# Patient Record
Sex: Male | Born: 1955 | Race: White | Hispanic: No | Smoking: Current every day smoker
Health system: Southern US, Community
[De-identification: ages and names within clinical notes are randomized; demographics above are authoritative.]

## PROBLEM LIST (undated history)

## (undated) DIAGNOSIS — C801 Malignant (primary) neoplasm, unspecified: Secondary | ICD-10-CM

## (undated) DIAGNOSIS — R918 Other nonspecific abnormal finding of lung field: Secondary | ICD-10-CM

## (undated) DIAGNOSIS — J449 Chronic obstructive pulmonary disease, unspecified: Secondary | ICD-10-CM

## (undated) DIAGNOSIS — I10 Essential (primary) hypertension: Secondary | ICD-10-CM

## (undated) DIAGNOSIS — E785 Hyperlipidemia, unspecified: Secondary | ICD-10-CM

## (undated) DIAGNOSIS — E279 Disorder of adrenal gland, unspecified: Secondary | ICD-10-CM

## (undated) HISTORY — PX: LUNG BIOPSY: SHX232

## (undated) HISTORY — DX: Chronic obstructive pulmonary disease, unspecified: J44.9

---

## 2004-06-01 ENCOUNTER — Emergency Department (HOSPITAL_COMMUNITY): Admission: EM | Admit: 2004-06-01 | Discharge: 2004-06-01 | Payer: Self-pay | Admitting: Emergency Medicine

## 2014-02-27 DIAGNOSIS — R079 Chest pain, unspecified: Secondary | ICD-10-CM | POA: Insufficient documentation

## 2014-02-27 DIAGNOSIS — R42 Dizziness and giddiness: Secondary | ICD-10-CM | POA: Insufficient documentation

## 2014-02-27 DIAGNOSIS — F419 Anxiety disorder, unspecified: Secondary | ICD-10-CM | POA: Insufficient documentation

## 2014-02-27 DIAGNOSIS — E875 Hyperkalemia: Secondary | ICD-10-CM | POA: Insufficient documentation

## 2016-09-17 ENCOUNTER — Emergency Department: Payer: Self-pay

## 2016-09-17 ENCOUNTER — Emergency Department
Admission: EM | Admit: 2016-09-17 | Discharge: 2016-09-18 | Disposition: A | Discharge status: Home / Self Care | Payer: Self-pay | Attending: Emergency Medicine | Admitting: Emergency Medicine

## 2016-09-17 DIAGNOSIS — I10 Essential (primary) hypertension: Secondary | ICD-10-CM | POA: Insufficient documentation

## 2016-09-17 DIAGNOSIS — F17200 Nicotine dependence, unspecified, uncomplicated: Secondary | ICD-10-CM | POA: Insufficient documentation

## 2016-09-17 DIAGNOSIS — R69 Illness, unspecified: Secondary | ICD-10-CM

## 2016-09-17 DIAGNOSIS — J111 Influenza due to unidentified influenza virus with other respiratory manifestations: Secondary | ICD-10-CM | POA: Insufficient documentation

## 2016-09-17 DIAGNOSIS — J439 Emphysema, unspecified: Secondary | ICD-10-CM | POA: Insufficient documentation

## 2016-09-17 LAB — CBC
HEMATOCRIT: 37.8 % — AB (ref 40.0–52.0)
Hemoglobin: 13.2 g/dL (ref 13.0–18.0)
MCH: 32.5 pg (ref 26.0–34.0)
MCHC: 34.9 g/dL (ref 32.0–36.0)
MCV: 93.1 fL (ref 80.0–100.0)
Platelets: 325 10*3/uL (ref 150–440)
RBC: 4.06 MIL/uL — AB (ref 4.40–5.90)
RDW: 12.5 % (ref 11.5–14.5)
WBC: 14.1 10*3/uL — ABNORMAL HIGH (ref 3.8–10.6)

## 2016-09-17 LAB — BASIC METABOLIC PANEL
Anion gap: 12 (ref 5–15)
BUN: 16 mg/dL (ref 6–20)
CHLORIDE: 96 mmol/L — AB (ref 101–111)
CO2: 23 mmol/L (ref 22–32)
Calcium: 9.1 mg/dL (ref 8.9–10.3)
Creatinine, Ser: 0.86 mg/dL (ref 0.61–1.24)
GFR calc non Af Amer: >60 mL/min (ref >60)
Glucose, Bld: 109 mg/dL — ABNORMAL HIGH (ref 65–99)
POTASSIUM: 3.8 mmol/L (ref 3.5–5.1)
SODIUM: 131 mmol/L — AB (ref 135–145)

## 2016-09-17 LAB — TROPONIN I: Troponin I: <0.03 ng/mL (ref <0.03)

## 2016-09-17 MED ORDER — AMLODIPINE BESYLATE 5 MG PO TABS
5.0000 mg | ORAL_TABLET | Freq: Once | ORAL | Status: AC
  Administered 2016-09-18: 5 mg via ORAL
  Filled 2016-09-17: qty 1

## 2016-09-17 MED ORDER — DEXAMETHASONE SODIUM PHOSPHATE 4 MG/ML IJ SOLN
10.0000 mg | Freq: Once | INTRAMUSCULAR | Status: AC
  Administered 2016-09-18: 10 mg via INTRAVENOUS
  Filled 2016-09-17: qty 3

## 2016-09-17 NOTE — ED Triage Notes (Signed)
Pt from home via EMS, reports "illness" for 4 days, reports diarrhea, chest pain, cough, dizziness and generalized sickness

## 2016-09-18 MED ORDER — NAPROXEN 500 MG PO TABS: 500.0000 mg | ORAL_TABLET | Freq: Two times a day (BID) | ORAL | 0 refills | Status: DC

## 2016-09-18 MED ORDER — AMLODIPINE BESYLATE 5 MG PO TABS: 5.0000 mg | ORAL_TABLET | Freq: Every day | ORAL | 1 refills | Status: DC

## 2016-09-18 MED ORDER — AZITHROMYCIN 250 MG PO TABS
ORAL_TABLET | ORAL | 0 refills | Status: DC
Start: 2016-09-18 — End: 2020-05-16

## 2016-09-18 NOTE — ED Provider Notes (Signed)
[facility] Emergency Department Provider Note  ____________________________________________  Time seen: Approximately 12:12 AM  I have reviewed the triage vital signs and the nursing notes.   HISTORY  Chief Complaint Diarrhea and Influenza    HPI Douglas [name] is a 60 y.o. male complains of feeling ill with generalized weakness and malaise bodyaches diarrhea nonproductive cough and dizziness for the past 4 days. Today he went to [employer], but some NyQuil, sat in the parking lot drinking it, went home and took a nap, and then woke up feeling somewhat delirious and confused. He never taken NyQuil before. He does admit that he took more than the recommended dose.  No fever or chills.  Does report previous diagnosis of hypertension but has been off of medication for 2 years due to loss of job and insurance     History reviewed. No pertinent past medical history.   There are no active problems to display for this patient.    History reviewed. No pertinent surgical history.   Prior to Admission medications   Medication Sig Start Date End Date Taking? Authorizing Provider  amLODipine (NORVASC) 5 MG tablet Take 1 tablet (5 mg total) by mouth daily. 09/18/16   Carrie Mew, MD  azithromycin (ZITHROMAX Z-PAK) 250 MG tablet Take 2 tablets (500 mg) on  Day 1,  followed by 1 tablet (250 mg) once daily on Days 2 through 5. 09/18/16   Carrie Mew, MD  naproxen (NAPROSYN) 500 MG tablet Take 1 tablet (500 mg total) by mouth 2 (two) times daily with a meal. 09/18/16   Carrie Mew, MD     Allergies Patient has no known allergies.   No family history on file.  Social History Social History  Substance Use Topics  . Smoking status: Current Every Day Smoker  . Smokeless tobacco: Never Used  . Alcohol use Yes    Review of Systems  Constitutional:   No fever Positive chills.  ENT:   Positive rhinorrhea and sore throat. Cardiovascular:    No chest pain. Respiratory:   No dyspnea. Positive nonproductive cough. Gastrointestinal:   Negative for abdominal pain, vomiting and diarrhea.  Genitourinary:   Negative for dysuria or difficulty urinating. Musculoskeletal:   Positive myalgias Neurological:   Negative for headaches 10-point ROS otherwise negative.  ____________________________________________   PHYSICAL EXAM:  VITAL SIGNS: ED Triage Vitals  Enc Vitals Group     BP 09/17/16 2235 (!) 186/100     Pulse Rate 09/17/16 2237 68     Resp 09/18/16 0005 20     Temp 09/17/16 2236 98.3 F (36.8 C)     Temp src --      SpO2 09/17/16 2237 93 %     Weight 09/17/16 2236 150 lb (68 kg)     Height 09/17/16 2236 '5\' 5"'$  (1.651 m)     Head Circumference --      Peak Flow --      Pain Score 09/17/16 2236 10     Pain Loc --      Pain Edu? --      Excl. in [place]? --     Vital signs reviewed, nursing assessments reviewed.   Constitutional:   Alert and oriented. Well appearing and in no distress. Eyes:   No scleral icterus. No conjunctival pallor. PERRL. EOMI.  No nystagmus. ENT   Head:   Normocephalic and atraumatic.   Nose:   No congestion/rhinnorhea. No septal hematoma   Mouth/Throat:   MMM, Mild  pharyngeal erythema. No peritonsillar mass.    Neck:   No stridor. No SubQ emphysema. No meningismus. Hematological/Lymphatic/Immunilogical:   No cervical lymphadenopathy. Cardiovascular:   RRR. Symmetric bilateral radial and DP pulses.  No murmurs.  Respiratory:   Normal respiratory effort without tachypnea nor retractions. Breath sounds are clear and equal bilaterally. No wheezes/rales/rhonchi. Bronchospastic cough occasionally Gastrointestinal:   Soft and nontender. Non distended. There is no CVA tenderness.  No rebound, rigidity, or guarding. Genitourinary:   deferred Musculoskeletal:   Nontender with normal range of motion in all extremities. No joint effusions.  No lower extremity tenderness.  No edema. Neurologic:    Normal speech and language.  CN 2-10 normal. Motor grossly intact. No gross focal neurologic deficits are appreciated.  Skin:    Skin is warm, dry and intact. No rash noted.  No petechiae, purpura, or bullae.  ____________________________________________    LABS (pertinent positives/negatives) (all labs ordered are listed, but only abnormal results are displayed) Labs Reviewed  BASIC METABOLIC PANEL - Abnormal; Notable for the following:       Result Value   Sodium 131 (*)    Chloride 96 (*)    Glucose, Bld 109 (*)    All other components within normal limits  CBC - Abnormal; Notable for the following:    WBC 14.1 (*)    RBC 4.06 (*)    HCT 37.8 (*)    All other components within normal limits  TROPONIN I   ____________________________________________   EKG    ____________________________________________    RADIOLOGY  Chest x-ray unremarkable. Evidence of emphysema  ____________________________________________   PROCEDURES Procedures  ____________________________________________   INITIAL IMPRESSION / ASSESSMENT AND PLAN / ED COURSE  Pertinent labs & imaging results that were available during my care of the patient were reviewed by me and considered in my medical decision making (see chart for details).  Patient well appearing no acute distress. Presents with influenza-like illness, URI. Almost certainly viral. Considering the patient's symptoms, medical history, and physical examination today, I have low suspicion for ACS, PE, TAD, pneumothorax, carditis, mediastinitis, pneumonia, CHF, or sepsis.  Decadron given in ED. Continue NSAIDs, azithromycin due to underlying emphysema. Follow-up with primary care. We'll start patient on Norvasc for his hypertension to bring that under control. This seems like a good choice of medication because it doesn't have as much side effects regarding electrolytes and kidney function and require close monitoring. I did strongly  encourage the patient to establish care with primary care.     Clinical Course    ____________________________________________   FINAL CLINICAL IMPRESSION(S) / ED DIAGNOSES  Final diagnoses:  Influenza-like illness  Essential hypertension  Pulmonary emphysema, unspecified emphysema type ([place])       Portions of this note were generated with dragon dictation software. Dictation errors may occur despite best attempts at proofreading.    Carrie Mew, MD 09/18/16 [phone]

## 2017-01-05 ENCOUNTER — Emergency Department: Payer: Self-pay

## 2017-01-05 ENCOUNTER — Encounter: Payer: Self-pay | Admitting: Emergency Medicine

## 2017-01-05 ENCOUNTER — Emergency Department
Admission: EM | Admit: 2017-01-05 | Discharge: 2017-01-05 | Disposition: A | Discharge status: Home / Self Care | Payer: Self-pay | Attending: Emergency Medicine | Admitting: Emergency Medicine

## 2017-01-05 DIAGNOSIS — Z79899 Other long term (current) drug therapy: Secondary | ICD-10-CM | POA: Insufficient documentation

## 2017-01-05 DIAGNOSIS — F17200 Nicotine dependence, unspecified, uncomplicated: Secondary | ICD-10-CM | POA: Insufficient documentation

## 2017-01-05 DIAGNOSIS — I10 Essential (primary) hypertension: Secondary | ICD-10-CM | POA: Insufficient documentation

## 2017-01-05 DIAGNOSIS — J101 Influenza due to other identified influenza virus with other respiratory manifestations: Secondary | ICD-10-CM | POA: Insufficient documentation

## 2017-01-05 HISTORY — DX: Essential (primary) hypertension: I10

## 2017-01-05 LAB — CBC
HEMATOCRIT: 48.9 % (ref 40.0–52.0)
HEMOGLOBIN: 16.8 g/dL (ref 13.0–18.0)
MCH: 32.3 pg (ref 26.0–34.0)
MCHC: 34.4 g/dL (ref 32.0–36.0)
MCV: 93.7 fL (ref 80.0–100.0)
Platelets: 210 10*3/uL (ref 150–440)
RBC: 5.22 MIL/uL (ref 4.40–5.90)
RDW: 13.4 % (ref 11.5–14.5)
WBC: 10.3 10*3/uL (ref 3.8–10.6)

## 2017-01-05 LAB — BASIC METABOLIC PANEL
ANION GAP: 10 (ref 5–15)
BUN: 10 mg/dL (ref 6–20)
CHLORIDE: 96 mmol/L — AB (ref 101–111)
CO2: 26 mmol/L (ref 22–32)
Calcium: 8.9 mg/dL (ref 8.9–10.3)
Creatinine, Ser: 0.88 mg/dL (ref 0.61–1.24)
GFR calc Af Amer: >60 mL/min (ref >60)
GLUCOSE: 114 mg/dL — AB (ref 65–99)
POTASSIUM: 4.1 mmol/L (ref 3.5–5.1)
Sodium: 132 mmol/L — ABNORMAL LOW (ref 135–145)

## 2017-01-05 LAB — TROPONIN I: Troponin I: <0.03 ng/mL (ref <0.03)

## 2017-01-05 LAB — INFLUENZA PANEL BY PCR (TYPE A & B)
INFLAPCR: NEGATIVE
INFLBPCR: POSITIVE — AB

## 2017-01-05 MED ORDER — IPRATROPIUM-ALBUTEROL 0.5-2.5 (3) MG/3ML IN SOLN
3.0000 mL | Freq: Once | RESPIRATORY_TRACT | Status: AC
  Administered 2017-01-05: 3 mL via RESPIRATORY_TRACT
  Filled 2017-01-05: qty 3

## 2017-01-05 MED ORDER — KETOROLAC TROMETHAMINE 30 MG/ML IJ SOLN
15.0000 mg | Freq: Once | INTRAMUSCULAR | Status: AC
  Administered 2017-01-05: 15 mg via INTRAVENOUS
  Filled 2017-01-05: qty 1

## 2017-01-05 MED ORDER — ALBUTEROL SULFATE HFA 108 (90 BASE) MCG/ACT IN AERS: 2.0000 | INHALATION_SPRAY | Freq: Four times a day (QID) | RESPIRATORY_TRACT | 2 refills | Status: DC | PRN

## 2017-01-05 MED ORDER — SODIUM CHLORIDE 0.9 % IV BOLUS (SEPSIS)
1000.0000 mL | Freq: Once | INTRAVENOUS | Status: AC
  Administered 2017-01-05: 1000 mL via INTRAVENOUS

## 2017-01-05 MED ORDER — NICOTINE 14 MG/24HR TD PT24: 14.0000 mg | MEDICATED_PATCH | TRANSDERMAL | 0 refills | Status: AC

## 2017-01-05 MED ORDER — ONDANSETRON HCL 4 MG/2ML IJ SOLN
4.0000 mg | Freq: Once | INTRAMUSCULAR | Status: AC
  Administered 2017-01-05: 4 mg via INTRAVENOUS
  Filled 2017-01-05: qty 2

## 2017-01-05 NOTE — ED Provider Notes (Signed)
[facility] Emergency Department Provider Note  ____________________________________________  Time seen: Approximately 2:20 PM  I have reviewed the triage vital signs and the nursing notes.   HISTORY  Chief Complaint Chest Pain; Cough; and Weakness   HPI Douglas [name] is a 61 y.o. male history of hypertension who presents for evaluation of flulike symptoms.Patient reports 5 days of cough productive of clear sputum, shortness of breath, generalized malaise and weakness, body aches, fever and chills, and multiple episodes of watery diarrhea. Has had nausea but no vomiting. Patient's nephew who lives with him has been diagnosed with the flu recently. Patient took over-the-counter medications for his cough and also took 2 pills of Tamiflu from his nephew. Patient complaining of sharp chest/abdomen/back pain when he coughs that resolves at rest. He is a smoker however has not been smoking since his gotten sick 5 days ago.  Past Medical History:  Diagnosis Date  . Hypertension     There are no active problems to display for this patient.   History reviewed. No pertinent surgical history.  Prior to Admission medications   Medication Sig Start Date End Date Taking? Authorizing Provider  amLODipine (NORVASC) 5 MG tablet Take 1 tablet (5 mg total) by mouth daily. 09/18/16  Yes Carrie Mew, MD  albuterol (PROVENTIL HFA;VENTOLIN HFA) 108 (90 Base) MCG/ACT inhaler Inhale 2 puffs into the lungs every 6 (six) hours as needed for wheezing or shortness of breath. 01/05/17   Rudene Re, MD  azithromycin (ZITHROMAX Z-PAK) 250 MG tablet Take 2 tablets (500 mg) on  Day 1,  followed by 1 tablet (250 mg) once daily on Days 2 through 5. Patient not taking: Reported on 01/05/2017 09/18/16   Carrie Mew, MD  naproxen (NAPROSYN) 500 MG tablet Take 1 tablet (500 mg total) by mouth 2 (two) times daily with a meal. Patient not taking: Reported on 01/05/2017 09/18/16    Carrie Mew, MD  nicotine (NICODERM CQ - DOSED IN MG/24 HOURS) 14 mg/24hr patch Place 1 patch (14 mg total) onto the skin daily. 01/05/17 01/05/18  Rudene Re, MD    Allergies Patient has no known allergies.  No family history on file.  Social History Social History  Substance Use Topics  . Smoking status: Current Every Day Smoker  . Smokeless tobacco: Never Used     Comment: none in 5 days.  . Alcohol use Yes    Review of Systems  Constitutional: + fever, chills, body aches, generalized weakness Eyes: Negative for visual changes. ENT: Negative for sore throat. Neck: No neck pain  Cardiovascular: + chest pain. Respiratory: + shortness of breath and cough Gastrointestinal: Negative for abdominal pain, vomiting. + nausea and diarrhea. Genitourinary: Negative for dysuria. Musculoskeletal: Negative for back pain. Skin: Negative for rash. Neurological: Negative for headaches, weakness or numbness. Psych: No SI or HI  ____________________________________________   PHYSICAL EXAM:  VITAL SIGNS: ED Triage Vitals  Enc Vitals Group     BP 01/05/17 1126 (!) 137/94     Pulse Rate 01/05/17 1126 80     Resp 01/05/17 1126 18     Temp 01/05/17 1126 98.1 F (36.7 C)     Temp Source 01/05/17 1126 Oral     SpO2 01/05/17 1126 96 %     Weight 01/05/17 1118 145 lb (65.8 kg)     Height 01/05/17 1118 '5\' 5"'$  (1.651 m)     Head Circumference --      Peak Flow --  Pain Score 01/05/17 1118 10     Pain Loc --      Pain Edu? --      Excl. in [place]? --     Constitutional: Alert and oriented. Well appearing, coughing during exam. HEENT:      Head: Normocephalic and atraumatic.         Eyes: Conjunctivae are normal. Sclera is non-icteric. EOMI. PERRL      Mouth/Throat: Mucous membranes are moist.       Neck: Supple with no signs of meningismus. Cardiovascular: Regular rate and rhythm. No murmurs, gallops, or rubs. 2+ symmetrical distal pulses are present in all extremities. No  JVD. ttp over the chest wall diffusely Respiratory: Normal respiratory effort. Lungs are clear to auscultation bilaterally with faint expiratory wheezes.  Gastrointestinal: Soft, non tender, and non distended with positive bowel sounds. No rebound or guarding. Genitourinary: No CVA tenderness. Musculoskeletal: Nontender with normal range of motion in all extremities. No edema, cyanosis, or erythema of extremities. Neurologic: Normal speech and language. Face is symmetric. Moving all extremities. No gross focal neurologic deficits are appreciated. Skin: Skin is warm, dry and intact. No rash noted. Psychiatric: Mood and affect are normal. Speech and behavior are normal.  ____________________________________________   LABS (all labs ordered are listed, but only abnormal results are displayed)  Labs Reviewed  BASIC METABOLIC PANEL - Abnormal; Notable for the following:       Result Value   Sodium 132 (*)    Chloride 96 (*)    Glucose, Bld 114 (*)    All other components within normal limits  INFLUENZA PANEL BY PCR (TYPE A & B) - Abnormal; Notable for the following:    Influenza B By PCR POSITIVE (*)    All other components within normal limits  CBC  TROPONIN I   ____________________________________________  EKG  ED ECG REPORT I, Rudene Re, the attending physician, personally viewed and interpreted this ECG.  Normal sinus rhythm, rate of 85, normal intervals, normal axis, no ST elevations or depressions. Normal EKG  ____________________________________________  RADIOLOGY  CXR: Negative  ____________________________________________   PROCEDURES  Procedure(s) performed: None Procedures Critical Care performed:  None ____________________________________________   INITIAL IMPRESSION / ASSESSMENT AND PLAN / ED COURSE  61 y.o. male history of hypertension who presents for evaluation of flulike symptoms x 5 days. Patient is coughing significantly during my evaluation,  his vital signs are within normal limits, he has faint diffuse expiratory wheezes throughout with good air movement, no crackles. Vital signs are otherwise within normal limits. Chest x-ray with no evidence of pneumonia. Flu swab is pending. We'll give one DuoNeb treatment this patient has a history of smoking and is now wheezing in the setting of a lower respiratory infection. We'll give IV fluids, IV Zofran, and IV Toradol for pleuritic pain in the setting of coughing.  Clinical Course as of Jan 06 1508  Tue Jan 05, 2017  1506 Patient diagnosed with influenza B. Outside the window for Tamiflu. Patient's cough and shortness of breath markedly improved after 1 DuoNeb. Patient fells improved with IVF. No longer wheezing. We'll send patient home on albuterol. I spent 5 minutes discussing smoking cessation with patient. We'll provide him with a prescription for nicotine patches. [facility] Discussed my standard return precautions.  [CV]    Clinical Course User Index [CV] Rudene Re, MD    Pertinent labs & imaging results that were available during my care of  the patient were reviewed by me and considered in my medical decision making (see chart for details).    ____________________________________________   FINAL CLINICAL IMPRESSION(S) / ED DIAGNOSES  Final diagnoses:  Influenza B      NEW MEDICATIONS STARTED DURING THIS VISIT:  New Prescriptions   ALBUTEROL (PROVENTIL HFA;VENTOLIN HFA) 108 (90 BASE) MCG/ACT INHALER    Inhale 2 puffs into the lungs every 6 (six) hours as needed for wheezing or shortness of breath.   NICOTINE (NICODERM CQ - DOSED IN MG/24 HOURS) 14 MG/24HR PATCH    Place 1 patch (14 mg total) onto the skin daily.     Note:  This document was prepared using Dragon voice recognition software and may include unintentional dictation errors.    Rudene Re, MD 01/05/17 [phone]

## 2017-01-05 NOTE — ED Triage Notes (Signed)
Says has been sick for about 5 days with cough.  Fever yesterday.  Has taken many otc and some tamiflu that was someone elses.  Pt with frequen dry cough in triage.  Says pain in chest is releived by laying flat on his chest.

## 2020-05-16 ENCOUNTER — Other Ambulatory Visit: Payer: Self-pay

## 2020-05-16 ENCOUNTER — Encounter: Payer: Self-pay | Admitting: Emergency Medicine

## 2020-05-16 ENCOUNTER — Observation Stay
Admission: EM | Admit: 2020-05-16 | Discharge: 2020-05-17 | Disposition: A | Discharge status: Home / Self Care | Payer: Medicare Other | Attending: Student | Admitting: Student

## 2020-05-16 ENCOUNTER — Emergency Department: Payer: Medicare Other

## 2020-05-16 DIAGNOSIS — J449 Chronic obstructive pulmonary disease, unspecified: Secondary | ICD-10-CM

## 2020-05-16 DIAGNOSIS — K209 Esophagitis, unspecified without bleeding: Principal | ICD-10-CM | POA: Diagnosis present

## 2020-05-16 DIAGNOSIS — E785 Hyperlipidemia, unspecified: Secondary | ICD-10-CM

## 2020-05-16 DIAGNOSIS — F17210 Nicotine dependence, cigarettes, uncomplicated: Secondary | ICD-10-CM | POA: Diagnosis not present

## 2020-05-16 DIAGNOSIS — R918 Other nonspecific abnormal finding of lung field: Secondary | ICD-10-CM | POA: Diagnosis not present

## 2020-05-16 DIAGNOSIS — R1310 Dysphagia, unspecified: Secondary | ICD-10-CM

## 2020-05-16 DIAGNOSIS — I10 Essential (primary) hypertension: Secondary | ICD-10-CM | POA: Diagnosis not present

## 2020-05-16 DIAGNOSIS — Z20822 Contact with and (suspected) exposure to covid-19: Secondary | ICD-10-CM | POA: Diagnosis not present

## 2020-05-16 DIAGNOSIS — J439 Emphysema, unspecified: Secondary | ICD-10-CM

## 2020-05-16 HISTORY — DX: Hyperlipidemia, unspecified: E78.5

## 2020-05-16 LAB — BASIC METABOLIC PANEL
Anion gap: 11 (ref 5–15)
BUN: 9 mg/dL (ref 8–23)
CO2: 28 mmol/L (ref 22–32)
Calcium: 9.8 mg/dL (ref 8.9–10.3)
Chloride: 100 mmol/L (ref 98–111)
Creatinine, Ser: 1.01 mg/dL (ref 0.61–1.24)
GFR calc Af Amer: >60 mL/min (ref >60)
GFR calc non Af Amer: >60 mL/min (ref >60)
Glucose, Bld: 133 mg/dL — ABNORMAL HIGH (ref 70–99)
Potassium: 4 mmol/L (ref 3.5–5.1)
Sodium: 139 mmol/L (ref 135–145)

## 2020-05-16 LAB — HEPATIC FUNCTION PANEL
ALT: 16 U/L (ref 0–44)
AST: 21 U/L (ref 15–41)
Albumin: 3.4 g/dL — ABNORMAL LOW (ref 3.5–5.0)
Alkaline Phosphatase: 103 U/L (ref 38–126)
Bilirubin, Direct: <0.1 mg/dL (ref 0.0–0.2)
Total Bilirubin: 0.6 mg/dL (ref 0.3–1.2)
Total Protein: 7.4 g/dL (ref 6.5–8.1)

## 2020-05-16 LAB — CBC
HCT: 41.7 % (ref 39.0–52.0)
Hemoglobin: 13.1 g/dL (ref 13.0–17.0)
MCH: 30.8 pg (ref 26.0–34.0)
MCHC: 31.4 g/dL (ref 30.0–36.0)
MCV: 98.1 fL (ref 80.0–100.0)
Platelets: 376 10*3/uL (ref 150–400)
RBC: 4.25 MIL/uL (ref 4.22–5.81)
RDW: 13.2 % (ref 11.5–15.5)
WBC: 16.3 10*3/uL — ABNORMAL HIGH (ref 4.0–10.5)
nRBC: 0 % (ref 0.0–0.2)

## 2020-05-16 LAB — TROPONIN I (HIGH SENSITIVITY)
Troponin I (High Sensitivity): 8 ng/L (ref <18)
Troponin I (High Sensitivity): 7 ng/L (ref <18)

## 2020-05-16 LAB — SARS CORONAVIRUS 2 BY RT PCR (HOSPITAL ORDER, PERFORMED IN ~~LOC~~ HOSPITAL LAB): SARS Coronavirus 2: NEGATIVE

## 2020-05-16 LAB — LIPASE, BLOOD: Lipase: 32 U/L (ref 11–51)

## 2020-05-16 LAB — MAGNESIUM: Magnesium: 2 mg/dL (ref 1.7–2.4)

## 2020-05-16 MED ORDER — FLUCONAZOLE 100 MG PO TABS
200.0000 mg | ORAL_TABLET | Freq: Once | ORAL | Status: AC
  Administered 2020-05-16: 200 mg via ORAL
  Filled 2020-05-16: qty 2

## 2020-05-16 MED ORDER — ACETAMINOPHEN 650 MG RE SUPP: 650.0000 mg | Freq: Four times a day (QID) | RECTAL | Status: DC | PRN

## 2020-05-16 MED ORDER — LACTATED RINGERS IV SOLN: INTRAVENOUS | Status: DC

## 2020-05-16 MED ORDER — ONDANSETRON HCL 4 MG/2ML IJ SOLN: 4.0000 mg | Freq: Four times a day (QID) | INTRAMUSCULAR | Status: DC | PRN

## 2020-05-16 MED ORDER — MORPHINE SULFATE (PF) 2 MG/ML IV SOLN
1.0000 mg | INTRAVENOUS | Status: DC | PRN
  Administered 2020-05-17 (×2): 1 mg via INTRAVENOUS
  Filled 2020-05-16 (×2): qty 1

## 2020-05-16 MED ORDER — IPRATROPIUM-ALBUTEROL 0.5-2.5 (3) MG/3ML IN SOLN
3.0000 mL | Freq: Once | RESPIRATORY_TRACT | Status: AC
  Administered 2020-05-16: 3 mL via RESPIRATORY_TRACT
  Filled 2020-05-16: qty 3

## 2020-05-16 MED ORDER — ROSUVASTATIN CALCIUM 10 MG PO TABS
10.0000 mg | ORAL_TABLET | Freq: Every day | ORAL | Status: DC
  Filled 2020-05-16: qty 1

## 2020-05-16 MED ORDER — LABETALOL HCL 5 MG/ML IV SOLN
10.0000 mg | Freq: Four times a day (QID) | INTRAVENOUS | Status: DC | PRN
  Administered 2020-05-17 (×2): 10 mg via INTRAVENOUS
  Filled 2020-05-16 (×2): qty 4

## 2020-05-16 MED ORDER — GABAPENTIN 300 MG PO CAPS
300.0000 mg | ORAL_CAPSULE | Freq: Two times a day (BID) | ORAL | Status: DC
  Administered 2020-05-17: 300 mg via ORAL
  Filled 2020-05-16: qty 1

## 2020-05-16 MED ORDER — PANTOPRAZOLE SODIUM 40 MG PO TBEC
40.0000 mg | DELAYED_RELEASE_TABLET | Freq: Every day | ORAL | 0 refills | Status: DC
Start: 2020-05-16 — End: 2020-05-17

## 2020-05-16 MED ORDER — ENOXAPARIN SODIUM 40 MG/0.4ML ~~LOC~~ SOLN
40.0000 mg | SUBCUTANEOUS | Status: DC
  Administered 2020-05-16: 40 mg via SUBCUTANEOUS
  Filled 2020-05-16: qty 0.4

## 2020-05-16 MED ORDER — ALBUTEROL SULFATE HFA 108 (90 BASE) MCG/ACT IN AERS: 1.0000 | INHALATION_SPRAY | Freq: Four times a day (QID) | RESPIRATORY_TRACT | 0 refills | Status: AC | PRN

## 2020-05-16 MED ORDER — SODIUM CHLORIDE 0.9 % IV BOLUS
1000.0000 mL | Freq: Once | INTRAVENOUS | Status: AC
  Administered 2020-05-16: 1000 mL via INTRAVENOUS

## 2020-05-16 MED ORDER — LIDOCAINE VISCOUS HCL 2 % MT SOLN
15.0000 mL | Freq: Once | OROMUCOSAL | Status: AC
  Administered 2020-05-16: 15 mL via ORAL
  Filled 2020-05-16: qty 15

## 2020-05-16 MED ORDER — ALBUTEROL SULFATE (2.5 MG/3ML) 0.083% IN NEBU: 2.5000 mg | INHALATION_SOLUTION | RESPIRATORY_TRACT | Status: DC | PRN

## 2020-05-16 MED ORDER — PANTOPRAZOLE SODIUM 40 MG IV SOLR
40.0000 mg | Freq: Two times a day (BID) | INTRAVENOUS | Status: DC
  Administered 2020-05-17 (×2): 40 mg via INTRAVENOUS
  Filled 2020-05-16 (×2): qty 40

## 2020-05-16 MED ORDER — IPRATROPIUM-ALBUTEROL 0.5-2.5 (3) MG/3ML IN SOLN: 3.0000 mL | Freq: Four times a day (QID) | RESPIRATORY_TRACT | Status: DC | PRN

## 2020-05-16 MED ORDER — ONDANSETRON HCL 4 MG/2ML IJ SOLN
4.0000 mg | Freq: Once | INTRAMUSCULAR | Status: AC
  Administered 2020-05-16: 4 mg via INTRAVENOUS
  Filled 2020-05-16: qty 2

## 2020-05-16 MED ORDER — PANTOPRAZOLE SODIUM 40 MG IV SOLR
40.0000 mg | Freq: Once | INTRAVENOUS | Status: AC
  Administered 2020-05-16: 40 mg via INTRAVENOUS
  Filled 2020-05-16: qty 40

## 2020-05-16 MED ORDER — ONDANSETRON HCL 4 MG PO TABS: 4.0000 mg | ORAL_TABLET | Freq: Four times a day (QID) | ORAL | Status: DC | PRN

## 2020-05-16 MED ORDER — SUCRALFATE 1 G PO TABS
1.0000 g | ORAL_TABLET | Freq: Three times a day (TID) | ORAL | 0 refills | Status: DC
Start: 2020-05-16 — End: 2020-05-24

## 2020-05-16 MED ORDER — FLUCONAZOLE 100 MG PO TABS
100.0000 mg | ORAL_TABLET | Freq: Every day | ORAL | Status: DC
  Administered 2020-05-17: 100 mg via ORAL
  Filled 2020-05-16: qty 1

## 2020-05-16 MED ORDER — ALUM & MAG HYDROXIDE-SIMETH 200-200-20 MG/5ML PO SUSP
30.0000 mL | Freq: Once | ORAL | Status: AC
  Administered 2020-05-16: 30 mL via ORAL
  Filled 2020-05-16: qty 30

## 2020-05-16 MED ORDER — GLUCAGON HCL (RDNA) 1 MG IJ SOLR
1.0000 mg | Freq: Once | INTRAMUSCULAR | Status: AC
  Administered 2020-05-16: 1 mg via INTRAVENOUS
  Filled 2020-05-16 (×2): qty 1

## 2020-05-16 MED ORDER — FAMOTIDINE IN NACL 20-0.9 MG/50ML-% IV SOLN
20.0000 mg | Freq: Once | INTRAVENOUS | Status: AC
  Administered 2020-05-16: 20 mg via INTRAVENOUS
  Filled 2020-05-16: qty 50

## 2020-05-16 MED ORDER — IOHEXOL 350 MG/ML SOLN
75.0000 mL | Freq: Once | INTRAVENOUS | Status: AC | PRN
  Administered 2020-05-16: 75 mL via INTRAVENOUS

## 2020-05-16 MED ORDER — SODIUM CHLORIDE 0.9% FLUSH: 3.0000 mL | Freq: Once | INTRAVENOUS | Status: DC

## 2020-05-16 MED ORDER — LOSARTAN POTASSIUM-HCTZ 50-12.5 MG PO TABS: 1.0000 | ORAL_TABLET | Freq: Every day | ORAL | Status: DC

## 2020-05-16 MED ORDER — ACETAMINOPHEN 325 MG PO TABS: 650.0000 mg | ORAL_TABLET | Freq: Four times a day (QID) | ORAL | Status: DC | PRN

## 2020-05-16 MED ORDER — MORPHINE SULFATE (PF) 4 MG/ML IV SOLN
4.0000 mg | Freq: Once | INTRAVENOUS | Status: AC
  Administered 2020-05-16: 4 mg via INTRAVENOUS
  Filled 2020-05-16: qty 1

## 2020-05-16 NOTE — ED Provider Notes (Signed)
-----------------------------------------   3:40 PM on 05/16/2020 -----------------------------------------  Blood pressure (!) 184/102, pulse 90, temperature 98.2 F (36.8 C), temperature source Oral, resp. rate (!) 22, height 5\' 5"  (1.651 m), weight 65.8 kg, SpO2 97 %.  Assuming care from Dr. Ellender Hose.  In short, Douglas [name] is a 64 y.o. male with a chief complaint of esophageal pain .  Refer to the original H&P for additional details.  The current plan of care is to follow-up treatment for esophagitis and COPD exacerbation, will need to PO challenge. Patient with new lung mass on CT and if he feels better and is able to tolerate PO, he will be appropriate for discharge home with Oncology follow-up.  ----------------------------------------- 6:36 PM on 05/16/2020 -----------------------------------------  Patient continues to report difficulty swallowing, with significant pain when he attempts to do so.  He does feel like his breathing has improved following treatment for COPD.  Given his ongoing difficulty swallowing along with COPD exacerbation and new lung mass, case was discussed with hospitalist for admission.     Blake Divine, MD 05/16/20 [phone]

## 2020-05-16 NOTE — ED Notes (Signed)
Patient given cup of ice per request for PO challenge

## 2020-05-16 NOTE — ED Notes (Signed)
Patient gave permission for this writer to tell his daughter the results of the CT scan as Dr. Myrene Buddy explained it to him.

## 2020-05-16 NOTE — Discharge Instructions (Addendum)
As we discussed, it is VERY important to follow-up with Oncology or Pulmonology for your lung mass. This is very concerning for cancer and needs urgent follow-up.  Do your best to try to stop or decrease your smoking.  For your chest discomfort/esophagitis: Start the prescribed antacid regimen Drink a glass of water when taking pills Avoid foods high in acid or spicy foods  For your breathing: Use the inhaler as needed Take the prednisone that was prescribed by your prior doctor (for your back, this will also help your breathing)    Gastroesophageal Reflux Disease, Adult Gastroesophageal reflux (GER) happens when acid from the stomach flows up into the tube that connects the mouth and the stomach (esophagus). Normally, food travels down the esophagus and stays in the stomach to be digested. With GER, food and stomach acid sometimes move back up into the esophagus. You may have a disease called gastroesophageal reflux disease (GERD) if the reflux:  Happens often.  Causes frequent or very bad symptoms.  Causes problems such as damage to the esophagus. When this happens, the esophagus becomes sore and swollen (inflamed). Over time, GERD can make small holes (ulcers) in the lining of the esophagus. What are the causes? This condition is caused by a problem with the muscle between the esophagus and the stomach. When this muscle is weak or not normal, it does not close properly to keep food and acid from coming back up from the stomach. The muscle can be weak because of:  Tobacco use.  Pregnancy.  Having a certain type of hernia (hiatal hernia).  Alcohol use.  Certain foods and drinks, such as coffee, chocolate, onions, and peppermint. What increases the risk? You are more likely to develop this condition if you:  Are overweight.  Have a disease that affects your connective tissue.  Use NSAID medicines. What are the signs or symptoms? Symptoms of this condition  include:  Heartburn.  Difficult or painful swallowing.  The feeling of having a lump in the throat.  A bitter taste in the mouth.  Bad breath.  Having a lot of saliva.  Having an upset or bloated stomach.  Belching.  Chest pain. Different conditions can cause chest pain. Make sure you see your doctor if you have chest pain.  Shortness of breath or noisy breathing (wheezing).  Ongoing (chronic) cough or a cough at night.  Wearing away of the surface of teeth (tooth enamel).  Weight loss. How is this treated? Treatment will depend on how bad your symptoms are. Your doctor may suggest:  Changes to your diet.  Medicine.  Surgery. Follow these instructions at home: Eating and drinking   Follow a diet as told by your doctor. You may need to avoid foods and drinks such as: ? Coffee and tea (with or without caffeine). ? Drinks that contain alcohol. ? Energy drinks and sports drinks. ? Bubbly (carbonated) drinks or sodas. ? Chocolate and cocoa. ? Peppermint and mint flavorings. ? Garlic and onions. ? Horseradish. ? Spicy and acidic foods. These include peppers, chili powder, curry powder, vinegar, hot sauces, and BBQ sauce. ? Citrus fruit juices and citrus fruits, such as oranges, lemons, and limes. ? Tomato-based foods. These include red sauce, chili, salsa, and pizza with red sauce. ? Fried and fatty foods. These include donuts, french fries, potato chips, and high-fat dressings. ? High-fat meats. These include hot dogs, rib eye steak, sausage, ham, and bacon. ? High-fat dairy items, such as whole milk, butter, and cream cheese.  Eat small meals often. Avoid eating large meals.  Avoid drinking large amounts of liquid with your meals.  Avoid eating meals during the 2-3 hours before bedtime.  Avoid lying down right after you eat.  Do not exercise right after you eat. Lifestyle   Do not use any products that contain nicotine or tobacco. These include  cigarettes, e-cigarettes, and chewing tobacco. If you need help quitting, ask your doctor.  Try to lower your stress. If you need help doing this, ask your doctor.  If you are overweight, lose an amount of weight that is healthy for you. Ask your doctor about a safe weight loss goal. General instructions  Pay attention to any changes in your symptoms.  Take over-the-counter and prescription medicines only as told by your doctor. Do not take aspirin, ibuprofen, or other NSAIDs unless your doctor says it is okay.  Wear loose clothes. Do not wear anything tight around your waist.  Raise (elevate) the head of your bed about 6 inches (15 cm).  Avoid bending over if this makes your symptoms worse.  Keep all follow-up visits as told by your doctor. This is important. Contact a doctor if:  You have new symptoms.  You lose weight and you do not know why.  You have trouble swallowing or it hurts to swallow.  You have wheezing or a cough that keeps happening.  Your symptoms do not get better with treatment.  You have a hoarse voice. Get help right away if:  You have pain in your arms, neck, jaw, teeth, or back.  You feel sweaty, dizzy, or light-headed.  You have chest pain or shortness of breath.  You throw up (vomit) and your throw-up looks like blood or coffee grounds.  You pass out (faint).  Your poop (stool) is bloody or black.  You cannot swallow, drink, or eat. Summary  If a person has gastroesophageal reflux disease (GERD), food and stomach acid move back up into the esophagus and cause symptoms or problems such as damage to the esophagus.  Treatment will depend on how bad your symptoms are.  Follow a diet as told by your doctor.  Take all medicines only as told by your doctor. This information is not intended to replace advice given to you by your health care provider. Make sure you discuss any questions you have with your health care provider. Document Revised:  04/06/2018 Document Reviewed: 04/06/2018 Elsevier Patient Education  [place].

## 2020-05-16 NOTE — ED Provider Notes (Signed)
[facility] Emergency Department Provider Note  ____________________________________________   First MD Initiated Contact with Patient 05/16/20 1242     (approximate)  I have reviewed the triage vital signs and the nursing notes.   HISTORY  Chief Complaint esophageal pain    HPI Douglas [name] is a 64 y.o. male  With h/o HTN here with SOB, difficulty swallowing.  Patient states that he began taking prednisone, Robaxin, and medications for a reported kidney infection several days ago.  He states that when trying to swallow them yesterday, he feels like something got stuck.  He has since had progressively worsening, severe, epigastric and substernal chest discomfort that is worse whenever he tries to swallow.  He states that whenever he tries to eat or drink something, it comes right back up.  He feels like a tightening sensation in his chest.  Denies any diaphoresis.  Denies any known coronary history.  He does smoke and admits to frequent cough that has worsened over the last several days as well.  He is also had some increase sputum production.  Denies any pleuritic component to his pain.  No specific alleviating or aggravating factors.  No other complaints.  No fevers or chills.       Past Medical History:  Diagnosis Date  . Hypertension     There are no problems to display for this patient.   History reviewed. No pertinent surgical history.  Prior to Admission medications   Medication Sig Start Date End Date Taking? Authorizing Provider  gabapentin (NEURONTIN) 300 MG capsule Take 300 mg by mouth 2 (two) times daily. 05/02/20  Yes [provider]  losartan-hydrochlorothiazide (HYZAAR) 50-12.5 MG tablet Take 1 tablet by mouth daily. 04/30/20  Yes [provider]  rosuvastatin (CRESTOR) 10 MG tablet Take 10 mg by mouth daily. 03/13/20  Yes [provider]  tamsulosin (FLOMAX) 0.4 MG CAPS capsule Take 0.4 mg by mouth every  evening. 04/01/20  Yes [provider]  traMADol (ULTRAM) 50 MG tablet Take 50 mg by mouth every 4 (four) hours as needed for pain. 05/01/20  Yes [provider]  albuterol (VENTOLIN HFA) 108 (90 Base) MCG/ACT inhaler Inhale 1-2 puffs into the lungs every 6 (six) hours as needed for wheezing or shortness of breath. 05/16/20   [name], MD  pantoprazole (PROTONIX) 40 MG tablet Take 1 tablet (40 mg total) by mouth daily for 14 days. 05/16/20 05/30/20  [name], MD  sucralfate (CARAFATE) 1 g tablet Take 1 tablet (1 g total) by mouth 4 (four) times daily -  with meals and at bedtime for 7 days. 05/16/20 05/23/20  [name], MD    Allergies Patient has no known allergies.  No family history on file.  Social History Social History   Tobacco Use  . Smoking status: Current Every Day Smoker  . Smokeless tobacco: Never Used  . Tobacco comment: none in 5 days.  Substance Use Topics  . Alcohol use: Yes  . Drug use: Not on file    Review of Systems  Review of Systems  Constitutional: Negative for chills, fatigue and fever.  HENT: Negative for sore throat.   Respiratory: Positive for chest tightness. Negative for shortness of breath.   Cardiovascular: Positive for chest pain.  Gastrointestinal: Negative for abdominal pain.  Genitourinary: Negative for flank pain.  Musculoskeletal: Negative for neck pain.  Skin: Negative for rash and wound.  Allergic/Immunologic: Negative for immunocompromised state.  Neurological: Negative for weakness and  numbness.  Hematological: Does not bruise/bleed easily.  All other systems reviewed and are negative.    ____________________________________________  PHYSICAL EXAM:      VITAL SIGNS: ED Triage Vitals  Enc Vitals Group     BP 05/16/20 0758 131/83     Pulse Rate 05/16/20 0758 (!) 111     Resp 05/16/20 0758 (!) 22     Temp 05/16/20 0758 98.2 F (36.8 C)     Temp Source 05/16/20 0758 Oral     SpO2 05/16/20 0758 98 %       Weight 05/16/20 0759 145 lb 1 oz (65.8 kg)     Height 05/16/20 0759 5\' 5"  (1.651 m)     Head Circumference --      Peak Flow --      Pain Score 05/16/20 0758 10     Pain Loc --      Pain Edu? --      Excl. in [place]? --      Physical Exam Vitals and nursing note reviewed.  Constitutional:      General: He is not in acute distress.    Appearance: He is well-developed.  HENT:     Head: Normocephalic and atraumatic.  Eyes:     Conjunctiva/sclera: Conjunctivae normal.  Cardiovascular:     Rate and Rhythm: Normal rate and regular rhythm.     Heart sounds: Normal heart sounds. No murmur heard.  No friction rub.  Pulmonary:     Effort: Pulmonary effort is normal. No respiratory distress.     Breath sounds: Wheezing present. No rales.  Abdominal:     General: There is no distension.     Palpations: Abdomen is soft.     Tenderness: There is abdominal tenderness.  Musculoskeletal:     Cervical back: Neck supple.  Skin:    General: Skin is warm.     Capillary Refill: Capillary refill takes less than 2 seconds.  Neurological:     Mental Status: He is alert and oriented to person, place, and time.     Motor: No abnormal muscle tone.       ____________________________________________   LABS (all labs ordered are listed, but only abnormal results are displayed)  Labs Reviewed  BASIC METABOLIC PANEL - Abnormal; Notable for the following components:      Result Value   Glucose, Bld 133 (*)    All other components within normal limits  CBC - Abnormal; Notable for the following components:   WBC 16.3 (*)    All other components within normal limits  HEPATIC FUNCTION PANEL - Abnormal; Notable for the following components:   Albumin 3.4 (*)    All other components within normal limits  LIPASE, BLOOD  TROPONIN I (HIGH SENSITIVITY)  TROPONIN I (HIGH SENSITIVITY)    ____________________________________________  EKG: Sinus tachycardia, VR 113. PR 116, QRS 72, QTc 414. No  acute St elevation or depressions. No signs of ischemia or infarct. ________________________________________  RADIOLOGY All imaging, including plain films, CT scans, and ultrasounds, independently reviewed by me, and interpretations confirmed via formal radiology reads.  ED MD interpretation:   CXR: Streaky interstitial opacity RUL  Official radiology report(s): DG Chest 2 View  Result Date: 05/16/2020 CLINICAL DATA:  Dysphagia, chest pain EXAM: CHEST - 2 VIEW COMPARISON:  05/02/2020 small FINDINGS: The heart size and mediastinal contours are within normal limits. Atherosclerotic calcification of the aortic knob. Mildly hyperinflated lungs. Streaky interstitial opacity within the right upper lobe. No pleural effusion or  pneumothorax. The visualized skeletal structures are unremarkable. IMPRESSION: 1. Streaky interstitial opacity within the right upper lobe suspicious for developing infection. 2. Mildly hyperinflated lungs, suggesting underlying COPD. Electronically Signed   By: [name] D.O.   On: 05/16/2020 08:25   CT Angio Chest PE W and/or Wo Contrast  Result Date: 05/16/2020 CLINICAL DATA:  Concern for pill stuck in throat for 2 days, now with pain with swallowing EXAM: CT ANGIOGRAPHY CHEST WITH CONTRAST TECHNIQUE: Multidetector CT imaging of the chest was performed using the standard protocol during bolus administration of intravenous contrast. Multiplanar CT image reconstructions and MIPs were obtained to evaluate the vascular anatomy. CONTRAST:  7mL OMNIPAQUE IOHEXOL 350 MG/ML SOLN COMPARISON:  Radiograph 05/16/2020 FINDINGS: Cardiovascular: Satisfactory opacification the pulmonary arteries to the segmental level. No pulmonary artery filling defects are identified. Central pulmonary arteries are normal caliber. Atherosclerotic plaque within the normal caliber aorta. No acute luminal abnormality nor periaortic stranding or hemorrhage. Normal 3 vessel branching of the aortic arch.  Atheromatous plaque in the otherwise normal proximal great vessels. Normal heart size. No pericardial effusion. Coronary artery atherosclerosis is present. Mediastinum/Nodes: No mediastinal fluid or gas. Normal thyroid gland and thoracic inlet. No acute abnormality of the trachea. Diffuse circumferential thickening of the thoracic esophagus without para esophageal fluid, gas or adenopathy. No worrisome mediastinal, hilar or axillary adenopathy. Lungs/Pleura: Corresponding to the streaky opacity present in the right upper lobe on comparison chest radiograph spiculated nodule in the right lung apex measuring 2.1 x 1.7 cm in size highly conspicuous for a bronchogenic carcinoma. Additional subpleural 3 mm nodule in the posterior segment right upper lobe (5/34). 2 mm nodule along the left major fissure in the left upper lobe (5/44). No other conspicuous nodules or masses. Background of centrilobular and paraseptal emphysema in the lungs. No consolidation, features of edema, pneumothorax, or effusion. Upper Abdomen: 2.7 x 1.5 cm heterogeneously enhancing which appears to arise from the adrenal gland concerning for potential metastatic disease. No other acute upper abdominal abnormality. Musculoskeletal: The osseous structures appear diffusely demineralized which may limit detection of small or nondisplaced fractures. No acute osseous abnormality or suspicious osseous lesion. Review of the MIP images confirms the above findings. IMPRESSION: 1. No evidence of pulmonary embolism. 2. Corresponding to the streaky opacity present in the right upper lobe on comparison chest radiograph, spiculated nodule in the right lung apex measuring 2.1 x 1.7 cm in size highly conspicuous for a bronchogenic carcinoma. Consider referral to multi disciplinary case conference for further management decisions. 3. 2.7 x 1.5 cm heterogeneously enhancing which appears to arise from the right adrenal gland concerning for potential metastatic disease.  4. Diffuse circumferential thickening of the thoracic esophagus, likely to correlate with patient's symptoms of esophagitis. 5. Aortic Atherosclerosis (ICD10-I70.0). 6. 7. These results were called by telephone at the time of interpretation on 05/16/2020 at 3:10 pm to provider [name] , who verbally acknowledged these results. Electronically Signed   By: Lovena Le M.D.   On: 05/16/2020 15:10    ____________________________________________  PROCEDURES   Procedure(s) performed (including Critical Care):  Procedures  ____________________________________________  INITIAL IMPRESSION / MDM / [place] / ED COURSE  As part of my medical decision making, I reviewed the following data within the [place] notes reviewed and incorporated, Old chart reviewed, Notes from prior ED visits, and [place] Controlled Substance Database       *ANEES VANECEK was evaluated in Emergency Department on 05/16/2020 for the symptoms  described in the history of present illness. He was evaluated in the context of the global COVID-19 pandemic, which necessitated consideration that the patient might be at risk for infection with the SARS-CoV-2 virus that causes COVID-19. Institutional protocols and algorithms that pertain to the evaluation of patients at risk for COVID-19 are in a state of rapid change based on information released by regulatory bodies including the CDC and federal and state organizations. These policies and algorithms were followed during the patient's care in the ED.  Some ED evaluations and interventions may be delayed as a result of limited staffing during the pandemic.*     Medical Decision Making:  64 yo M here with difficulty swallowing. Suspect esophagitis, possibly pill-related vs GERD. DDx includes occult esophageal/oropharyngeal CA given his smoking history. Pt also with diffuse wheezing, increased WOB likely from his underlying COPD, but will start nebs,  give Gi cocktail, and reassess.  Unfortunately, CT scan is concerning for possible lung CA. This is particularly concerning given his extensive smoking history. Discussed with pt in detail - he is aware. Unfortunately, his wife passed from lung CA as well. Discussed with Dr. Patsy [place] and will setup with outpt urgent follow-up.   Otherwise, will attempt symptomatic control, treatment for his underlying COPD, and reassess. If improved ad tolerating PO, can f/u as outpt.  ____________________________________________  FINAL CLINICAL IMPRESSION(S) / ED DIAGNOSES  Final diagnoses:  Lung mass  Esophagitis  Pulmonary emphysema, unspecified emphysema type ([place])     MEDICATIONS GIVEN DURING THIS VISIT:  Medications  sodium chloride flush (NS) 0.9 % injection 3 mL (3 mLs Intravenous Not Given 05/16/20 1330)  famotidine (PEPCID) IVPB 20 mg premix (has no administration in time range)  pantoprazole (PROTONIX) injection 40 mg (has no administration in time range)  morphine 4 MG/ML injection 4 mg (has no administration in time range)  alum & mag hydroxide-simeth (MAALOX/MYLANTA) 200-200-20 MG/5ML suspension 30 mL (30 mLs Oral Given 05/16/20 1409)    And  lidocaine (XYLOCAINE) 2 % viscous mouth solution 15 mL (15 mLs Oral Given 05/16/20 1409)  ipratropium-albuterol (DUONEB) 0.5-2.5 (3) MG/3ML nebulizer solution 3 mL (3 mLs Nebulization Given 05/16/20 1413)  ipratropium-albuterol (DUONEB) 0.5-2.5 (3) MG/3ML nebulizer solution 3 mL (3 mLs Nebulization Given 05/16/20 1412)  ipratropium-albuterol (DUONEB) 0.5-2.5 (3) MG/3ML nebulizer solution 3 mL (3 mLs Nebulization Given 05/16/20 1412)  sodium chloride 0.9 % bolus 1,000 mL (1,000 mLs Intravenous New Bag/Given 05/16/20 1404)  glucagon (GLUCAGEN) injection 1 mg (1 mg Intravenous Given 05/16/20 1406)  ondansetron (ZOFRAN) injection 4 mg (4 mg Intravenous Given 05/16/20 1406)  iohexol (OMNIPAQUE) 350 MG/ML injection 75 mL (75 mLs Intravenous Contrast Given 05/16/20 1443)      ED Discharge Orders         Ordered    pantoprazole (PROTONIX) 40 MG tablet  Daily     Discontinue  Reprint     05/16/20 1539    sucralfate (CARAFATE) 1 g tablet  3 times daily with meals & bedtime     Discontinue  Reprint     05/16/20 1539    albuterol (VENTOLIN HFA) 108 (90 Base) MCG/ACT inhaler  Every 6 hours PRN     Discontinue  Reprint     05/16/20 1539           Note:  This document was prepared using Dragon voice recognition software and may include unintentional dictation errors.   [name], MD 05/16/20 1659

## 2020-05-16 NOTE — H&P (Signed)
History and Physical    Douglas [name] UUV:[id number] DOB: 1956/05/16 DOA: 05/16/2020  PCP: Patient, No Pcp Per  Patient coming from: Home  I have personally briefly reviewed patient's old medical records in [place]  Chief Complaint: Dysphagia  HPI: Douglas [name] is a 64 y.o. male with medical history significant for hypertension, hyperlipidemia, COPD, and tobacco use who presents to the ED for evaluation of dysphagia/odynophagia.  Patient reports new onset of dysphagia and odynophagia beginning 2 days ago.  He has been having difficulty swallowing foods, liquids, and pills and has sensation of pills getting stuck in his lower chest/upper abdomen.  He has not been able to maintain adequate oral intake and has been afraid to take his oral medications due to his symptoms.  He denies any nausea or vomiting.  He has not had any anginal type chest pain.  He denies any subjective fevers, chills, diaphoresis, weight loss, diarrhea, or dysuria.  He reports significant smoking history of 1 pack/day for many years and says he quit 3 weeks ago.  He reports alcohol use consisting of approximately 2 beers daily.  He reports occasional marijuana use but denies any other illicit drugs.  He denies any known family history of cancer or heart disease.  He says his wife unfortunately passed away from lung cancer.  ED Course:  Initial vitals showed BP 131/83, pulse 111, RR 22, temp 98.2 Fahrenheit, SPO2 90% on room air.  Subsequent BP elevated to 180s/100s.  Labs show WBC 16.3, hemoglobin 13.1, platelets 376,000, sodium 139, potassium 4.0, bicarb 28, BUN 9, creatinine 1.01, serum glucose 133, high-sensitivity troponin I 8 and 7, lipase 32, AST 21, ALT 16, alk phos 103, total bilirubin 0.6.  SARS-CoV-2 PCR is obtained and pending.  2 view chest x-ray showed streaky interstitial opacity within the right upper lobe and mildly hyperinflated lungs.  CTA chest PE study was negative for evidence of  pulmonary embolism.  Spiculated nodule in the right lung apex measuring 2.1 x 1.7 cm is seen, suspicious for bronchogenic carcinoma.  2.7 x 1.5 cm right adrenal gland lesion is also seen.  Diffuse circumferential thickening of the thoracic esophagus also noted.  Patient was given 1 L normal saline, IV morphine, Pepcid, Protonix, Zofran, and DuoNeb treatments.  EDP discussed case with on-call oncology who recommended outpatient follow-up for right lung mass evaluation/management.  Patient continued significant dysphagia/odynophagia and unable to maintain adequate oral intake therefore the hospitalist service was consulted for further evaluation management.  Review of Systems: All systems reviewed and are negative except as documented in history of present illness above.   Past Medical History:  Diagnosis Date  . Hyperlipidemia   . Hypertension     History reviewed. No pertinent surgical history.  Social History:  reports that he has quit smoking. He has never used smokeless tobacco. He reports current alcohol use.  Drug: Marijuana.  No Known Allergies  Family History  Problem Relation Age of Onset  . Cancer Neg Hx      Prior to Admission medications   Medication Sig Start Date End Date Taking? Authorizing Provider  gabapentin (NEURONTIN) 300 MG capsule Take 300 mg by mouth 2 (two) times daily. 05/02/20  Yes [provider]  losartan-hydrochlorothiazide (HYZAAR) 50-12.5 MG tablet Take 1 tablet by mouth daily. 04/30/20  Yes [provider]  rosuvastatin (CRESTOR) 10 MG tablet Take 10 mg by mouth daily. 03/13/20  Yes [provider]  tamsulosin (FLOMAX) 0.4 MG CAPS capsule Take 0.4  mg by mouth every evening. 04/01/20  Yes [provider]  traMADol (ULTRAM) 50 MG tablet Take 50 mg by mouth every 4 (four) hours as needed for pain. 05/01/20  Yes [provider]  albuterol (VENTOLIN HFA) 108 (90 Base) MCG/ACT inhaler Inhale 1-2 puffs into the lungs  every 6 (six) hours as needed for wheezing or shortness of breath. 05/16/20   [name], MD  pantoprazole (PROTONIX) 40 MG tablet Take 1 tablet (40 mg total) by mouth daily for 14 days. 05/16/20 05/30/20  [name], MD  sucralfate (CARAFATE) 1 g tablet Take 1 tablet (1 g total) by mouth 4 (four) times daily -  with meals and at bedtime for 7 days. 05/16/20 05/23/20  [name], MD    Physical Exam: Vitals:   05/16/20 0758 05/16/20 0759 05/16/20 1345 05/16/20 1712  BP: 131/83  (!) 184/102 (!) 181/109  Pulse: (!) 111  90 79  Resp: (!) 22  (!) 22 20  Temp: 98.2 F (36.8 C)     TempSrc: Oral     SpO2: 98%  97% 96%  Weight:  65.8 kg    Height:  _0  (1.651 m)     Constitutional: Sitting up in bed, NAD, calm, comfortable Eyes: PERRL, lids and conjunctivae normal ENMT: Mucous membranes are dry.  Oral thrush present. Neck: normal, supple, no masses. Respiratory: Distant breath sounds without active wheezing. Normal respiratory effort. No accessory muscle use.  Cardiovascular: Regular rate and rhythm, no murmurs / rubs / gallops. No extremity edema. 2+ pedal pulses. Abdomen: no tenderness, no masses palpated. No hepatosplenomegaly. Bowel sounds positive.  Musculoskeletal: no clubbing / cyanosis. No joint deformity upper and lower extremities. Good ROM, no contractures. Normal muscle tone.  Skin: no rashes, lesions, ulcers. No induration Neurologic: CN 2-12 grossly intact. Sensation intact, Strength 5/5 in all 4.  Psychiatric: Normal judgment and insight. Alert and oriented x 3. Normal mood.    Labs on Admission: I have personally reviewed following labs and imaging studies  CBC: Recent Labs  Lab 05/16/20 0805  WBC 16.3*  HGB 13.1  HCT 41.7  MCV 98.1  PLT 614   Basic Metabolic Panel: Recent Labs  Lab 05/16/20 0805  NA 139  K 4.0  CL 100  CO2 28  GLUCOSE 133*  BUN 9  CREATININE 1.01  CALCIUM 9.8   GFR: Estimated Creatinine Clearance: 63.4 mL/min (by C-G formula  based on SCr of 1.01 mg/dL). Liver Function Tests: Recent Labs  Lab 05/16/20 1354  AST 21  ALT 16  ALKPHOS 103  BILITOT 0.6  PROT 7.4  ALBUMIN 3.4*   Recent Labs  Lab 05/16/20 1354  LIPASE 32   No results for input(s): AMMONIA in the last 168 hours. Coagulation Profile: No results for input(s): INR, PROTIME in the last 168 hours. Cardiac Enzymes: No results for input(s): CKTOTAL, CKMB, CKMBINDEX, TROPONINI in the last 168 hours. BNP (last 3 results) No results for input(s): PROBNP in the last 8760 hours. HbA1C: No results for input(s): HGBA1C in the last 72 hours. CBG: No results for input(s): GLUCAP in the last 168 hours. Lipid Profile: No results for input(s): CHOL, HDL, LDLCALC, TRIG, CHOLHDL, LDLDIRECT in the last 72 hours. Thyroid Function Tests: No results for input(s): TSH, T4TOTAL, FREET4, T3FREE, THYROIDAB in the last 72 hours. Anemia Panel: No results for input(s): VITAMINB12, FOLATE, FERRITIN, TIBC, IRON, RETICCTPCT in the last 72 hours. Urine analysis: No results found for: COLORURINE, APPEARANCEUR, [place], [place], [place], [place], [place], [place],  PROTEINUR, UROBILINOGEN, NITRITE, LEUKOCYTESUR  Radiological Exams on Admission: DG Chest 2 View  Result Date: 05/16/2020 CLINICAL DATA:  Dysphagia, chest pain EXAM: CHEST - 2 VIEW COMPARISON:  05/02/2020 small FINDINGS: The heart size and mediastinal contours are within normal limits. Atherosclerotic calcification of the aortic knob. Mildly hyperinflated lungs. Streaky interstitial opacity within the right upper lobe. No pleural effusion or pneumothorax. The visualized skeletal structures are unremarkable. IMPRESSION: 1. Streaky interstitial opacity within the right upper lobe suspicious for developing infection. 2. Mildly hyperinflated lungs, suggesting underlying COPD. Electronically Signed   By: [name] D.O.   On: 05/16/2020 08:25   CT Angio Chest PE W and/or Wo Contrast  Result Date:  05/16/2020 CLINICAL DATA:  Concern for pill stuck in throat for 2 days, now with pain with swallowing EXAM: CT ANGIOGRAPHY CHEST WITH CONTRAST TECHNIQUE: Multidetector CT imaging of the chest was performed using the standard protocol during bolus administration of intravenous contrast. Multiplanar CT image reconstructions and MIPs were obtained to evaluate the vascular anatomy. CONTRAST:  76m OMNIPAQUE IOHEXOL 350 MG/ML SOLN COMPARISON:  Radiograph 05/16/2020 FINDINGS: Cardiovascular: Satisfactory opacification the pulmonary arteries to the segmental level. No pulmonary artery filling defects are identified. Central pulmonary arteries are normal caliber. Atherosclerotic plaque within the normal caliber aorta. No acute luminal abnormality nor periaortic stranding or hemorrhage. Normal 3 vessel branching of the aortic arch. Atheromatous plaque in the otherwise normal proximal great vessels. Normal heart size. No pericardial effusion. Coronary artery atherosclerosis is present. Mediastinum/Nodes: No mediastinal fluid or gas. Normal thyroid gland and thoracic inlet. No acute abnormality of the trachea. Diffuse circumferential thickening of the thoracic esophagus without para esophageal fluid, gas or adenopathy. No worrisome mediastinal, hilar or axillary adenopathy. Lungs/Pleura: Corresponding to the streaky opacity present in the right upper lobe on comparison chest radiograph spiculated nodule in the right lung apex measuring 2.1 x 1.7 cm in size highly conspicuous for a bronchogenic carcinoma. Additional subpleural 3 mm nodule in the posterior segment right upper lobe (5/34). 2 mm nodule along the left major fissure in the left upper lobe (5/44). No other conspicuous nodules or masses. Background of centrilobular and paraseptal emphysema in the lungs. No consolidation, features of edema, pneumothorax, or effusion. Upper Abdomen: 2.7 x 1.5 cm heterogeneously enhancing which appears to arise from the adrenal gland  concerning for potential metastatic disease. No other acute upper abdominal abnormality. Musculoskeletal: The osseous structures appear diffusely demineralized which may limit detection of small or nondisplaced fractures. No acute osseous abnormality or suspicious osseous lesion. Review of the MIP images confirms the above findings. IMPRESSION: 1. No evidence of pulmonary embolism. 2. Corresponding to the streaky opacity present in the right upper lobe on comparison chest radiograph, spiculated nodule in the right lung apex measuring 2.1 x 1.7 cm in size highly conspicuous for a bronchogenic carcinoma. Consider referral to multi disciplinary case conference for further management decisions. 3. 2.7 x 1.5 cm heterogeneously enhancing which appears to arise from the right adrenal gland concerning for potential metastatic disease. 4. Diffuse circumferential thickening of the thoracic esophagus, likely to correlate with patient's symptoms of esophagitis. 5. Aortic Atherosclerosis (ICD10-I70.0). 6. 7. These results were called by telephone at the time of interpretation on 05/16/2020 at 3:10 pm to provider CDuffy Bruce, who verbally acknowledged these results. Electronically Signed   By: PLovena LeM.D.   On: 05/16/2020 15:10    EKG: Independently reviewed. Sinus tachycardia, rate is faster when compared to prior.  Assessment/Plan Active Problems:  Esophagitis   Mass of upper lobe of right lung   Hypertension   Hyperlipidemia   COPD (chronic obstructive pulmonary disease) (HCC)  Douglas [name] is a 64 y.o. male with medical history significant for hypertension, hyperlipidemia, COPD, and tobacco use who is admitted with dysphagia/odynophagia/esophagitis and right upper lobe lung mass.  Dysphagia/odynophagia/esophagitis: Patient with difficulty and pain on swallowing foods, liquids, and pills.  He has not been able to maintain adequate oral intake.  CT imaging suggestive of thoracic esophagitis.  He does  appear to have oral thrush on examination.  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 -Start oral fluconazole, switch to IV if not tolerating -Obtain barium swallow eval, may need further GI evaluation pending results -Continue IV Protonix 40 mg twice daily -Start IV fluid hydration overnight -Keep n.p.o. except for sips with meds  Right upper lobe lung mass: Spiculated nodule in the right lung apex measuring 2.1 x 1.7 cm is seen, suspicious for bronchogenic carcinoma.  2.7 x 1.5 cm right adrenal gland lesion is also seen suspicious for metastatic disease.  Patient aware of findings and will need oncology follow-up for further management plan.  COPD: Mild symptoms on arrival now improved after bronchodilator treatments in the ED.  Continue as needed albuterol and duo nebs.  Hypertension: Hypertensive on arrival due to inability to take home antihypertensives.  Start on IV labetalol as needed and resume home oral meds as tolerated.  Hyperlipidemia: Resume home rosuvastatin as tolerated.  Tobacco use: Reports chronic tobacco use 1 PPD and states he quit 3 weeks ago.  DVT prophylaxis: Lovenox Code Status: Full code, confirmed with patient Family Communication: Discussed with patient, he has discussed with family Disposition Plan: From home and likely discharge to home pending ability to maintain adequate oral intake Consults called: None Admission status:  Status is: Observation  The patient remains OBS appropriate and will d/c before 2 midnights.  Dispo: The patient is from: Home              Anticipated d/c is to: Home              Anticipated d/c date is: 1 day pending further dysphagia/odynophagia work up and ability to maintain adequate oral intake.              Patient currently is not medically stable to d/c.  Zada Finders MD [facility]  If 7PM-7AM, please contact night-coverage [email or web address]  05/16/2020, 7:58 PM

## 2020-05-16 NOTE — ED Triage Notes (Signed)
C?O having pills stuck in esophagus x 2 days.  States feels pain when swallowing liquid or food.  AAOx3.  Skin warm and  Dry. NAD

## 2020-05-16 NOTE — ED Notes (Signed)
Report given to Chris RN

## 2020-05-16 NOTE — ED Notes (Signed)
Patient taken to CT scan.

## 2020-05-16 NOTE — ED Notes (Signed)
Patient given Sprite and graham crackers per Dr. Charna Archer.

## 2020-05-16 NOTE — ED Notes (Signed)
Patient was instructed again to use call bell before getting up to the room commode. Patient agreed. Patient was repositioned on stretcher.

## 2020-05-17 ENCOUNTER — Observation Stay: Payer: Medicare Other

## 2020-05-17 DIAGNOSIS — R918 Other nonspecific abnormal finding of lung field: Secondary | ICD-10-CM

## 2020-05-17 DIAGNOSIS — J418 Mixed simple and mucopurulent chronic bronchitis: Secondary | ICD-10-CM

## 2020-05-17 DIAGNOSIS — R1310 Dysphagia, unspecified: Secondary | ICD-10-CM | POA: Diagnosis not present

## 2020-05-17 DIAGNOSIS — I10 Essential (primary) hypertension: Secondary | ICD-10-CM

## 2020-05-17 DIAGNOSIS — K209 Esophagitis, unspecified without bleeding: Secondary | ICD-10-CM | POA: Diagnosis not present

## 2020-05-17 DIAGNOSIS — G894 Chronic pain syndrome: Secondary | ICD-10-CM

## 2020-05-17 LAB — BASIC METABOLIC PANEL
Anion gap: 9 (ref 5–15)
BUN: 10 mg/dL (ref 8–23)
CO2: 31 mmol/L (ref 22–32)
Calcium: 8.7 mg/dL — ABNORMAL LOW (ref 8.9–10.3)
Chloride: 103 mmol/L (ref 98–111)
Creatinine, Ser: 1.15 mg/dL (ref 0.61–1.24)
GFR calc Af Amer: >60 mL/min (ref >60)
GFR calc non Af Amer: >60 mL/min (ref >60)
Glucose, Bld: 110 mg/dL — ABNORMAL HIGH (ref 70–99)
Potassium: 4.9 mmol/L (ref 3.5–5.1)
Sodium: 143 mmol/L (ref 135–145)

## 2020-05-17 LAB — CBC
HCT: 35.4 % — ABNORMAL LOW (ref 39.0–52.0)
Hemoglobin: 11.4 g/dL — ABNORMAL LOW (ref 13.0–17.0)
MCH: 31.2 pg (ref 26.0–34.0)
MCHC: 32.2 g/dL (ref 30.0–36.0)
MCV: 97 fL (ref 80.0–100.0)
Platelets: 275 10*3/uL (ref 150–400)
RBC: 3.65 MIL/uL — ABNORMAL LOW (ref 4.22–5.81)
RDW: 13.3 % (ref 11.5–15.5)
WBC: 11 10*3/uL — ABNORMAL HIGH (ref 4.0–10.5)
nRBC: 0 % (ref 0.0–0.2)

## 2020-05-17 LAB — HIV ANTIBODY (ROUTINE TESTING W REFLEX): HIV Screen 4th Generation wRfx: NONREACTIVE

## 2020-05-17 MED ORDER — TRAMADOL HCL 50 MG PO TABS: 50.0000 mg | ORAL_TABLET | Freq: Three times a day (TID) | ORAL | 0 refills | Status: AC | PRN

## 2020-05-17 MED ORDER — PANTOPRAZOLE SODIUM 40 MG PO TBEC
40.0000 mg | DELAYED_RELEASE_TABLET | Freq: Every day | ORAL | 0 refills | Status: AC
Start: 2020-05-17 — End: 2020-08-15

## 2020-05-17 MED ORDER — HYDROCHLOROTHIAZIDE 12.5 MG PO CAPS
12.5000 mg | ORAL_CAPSULE | Freq: Every day | ORAL | Status: DC
  Administered 2020-05-17: 12.5 mg via ORAL
  Filled 2020-05-17: qty 1

## 2020-05-17 MED ORDER — IPRATROPIUM-ALBUTEROL 0.5-2.5 (3) MG/3ML IN SOLN: 3.0000 mL | RESPIRATORY_TRACT | Status: DC | PRN

## 2020-05-17 MED ORDER — LABETALOL HCL 5 MG/ML IV SOLN: 10.0000 mg | Freq: Four times a day (QID) | INTRAVENOUS | Status: DC | PRN

## 2020-05-17 MED ORDER — LOSARTAN POTASSIUM 50 MG PO TABS
50.0000 mg | ORAL_TABLET | Freq: Every day | ORAL | Status: DC
  Administered 2020-05-17: 50 mg via ORAL
  Filled 2020-05-17: qty 1

## 2020-05-17 MED ORDER — MOMETASONE FURO-FORMOTEROL FUM 200-5 MCG/ACT IN AERO
2.0000 | INHALATION_SPRAY | Freq: Two times a day (BID) | RESPIRATORY_TRACT | Status: DC
  Filled 2020-05-17: qty 8.8

## 2020-05-17 MED ORDER — LOSARTAN POTASSIUM 50 MG PO TABS: 100.0000 mg | ORAL_TABLET | Freq: Every day | ORAL | Status: DC

## 2020-05-17 MED ORDER — ALBUTEROL SULFATE HFA 108 (90 BASE) MCG/ACT IN AERS
2.0000 | INHALATION_SPRAY | Freq: Four times a day (QID) | RESPIRATORY_TRACT | 1 refills | Status: DC | PRN
Start: 2020-05-17 — End: 2020-06-12

## 2020-05-17 MED ORDER — HYDROCHLOROTHIAZIDE 12.5 MG PO CAPS: 12.5000 mg | ORAL_CAPSULE | Freq: Every day | ORAL | Status: DC

## 2020-05-17 MED ORDER — MOMETASONE FURO-FORMOTEROL FUM 200-5 MCG/ACT IN AERO: 2.0000 | INHALATION_SPRAY | Freq: Two times a day (BID) | RESPIRATORY_TRACT | 1 refills | Status: DC

## 2020-05-17 NOTE — Progress Notes (Signed)
Received report from [place], [place]. Patient brought to 1A room 136. Vital signs taken. Patient put on 2 liters nasal canula due to complaint of sob. Pt denies pain. BP was elevated . MEWS score of 2. Will notify provider and recheck BP accordingly. No signs of distress. Call bell in reach. Will continue to monitor. Assessment to follow.

## 2020-05-17 NOTE — Discharge Summary (Signed)
Physician Discharge Summary  Douglas [name] MBT:[id number] DOB: May 21, 1956 DOA: 05/16/2020  PCP: Patient, No Pcp Per  Admit date: 05/16/2020 Discharge date: 05/17/2020  Admitted From: Home Disposition: Home  Recommendations for Outpatient Follow-up:  1. Follow up with oncology as below. 2. Ambulatory referral to gastroenterology 3. Please obtain CBC/BMP at follow up 4. Please follow up on the following pending results: None  Home Health: None Equipment/Devices: None  Discharge Condition: Stable CODE STATUS: Full code   Follow-up Information    [name], MD.   Specialty: Hematology and Oncology Why: The office should call you to set up an appointment ASAP Contact information: [place] [address] [phone]               Hospital Course: 64 year old male with PMH of COPD, HTN, HLD and tobacco use presenting with 2 to 3 days of dysphagia and odynophagia with solids and liquids.  He endorses sensation of pills getting stuck in his lower chest/upper abdomen.  He also reports unintentional weight loss but could not quantify.  He denies fever, chills, excessive night sweats, melena, hematochezia or diarrhea.  Smoked about a pack a day but quit about 3 weeks ago.  Drinks about 2 beers daily.  In ED, BP rose from 131/83-180/100.  HR 111.  WBC 16.3.  Hgb 13.1.  CMP and lipase without significant finding.  High-sensitivity troponin negative.  COVID-19 PCR negative.  CXR with evidence of COPD.  CTA chest negative for PE but spiculated right lung nodule at the apex measuring 2.1 x 1.7 cm, right adrenal gland lesion measuring 2.7 x 1.5 cm, and diffuse circumferential thickening of thoracic esophagus.  Oncology consulted by EDP and recommended outpatient follow-up for the right lung mass evaluation and management.  Ambulatory referral to pulmonology ordered.  Barium swallow revealed mild esophagitis with reflux.  He tolerated full liquid diet.   Discharged on oral Protonix 40 mg daily for 6 weeks, and Carafate for 1 week.  Ambulatory referral to GI ordered.  See individual problem list below for more on hospital course.  Discharge Diagnoses:  Dysphagia/odynophagia/esophagitis:CTA chest suggestive of thoracic esophagitis. He does appear to have oral thrush on examination. He reports unintentional weight loss but his weight appears to be stable from 2018.  Barium swallow suggestive for mild esophagitis with mild GERD. -Tolerated full liquid diet. -Discharged on Protonix 40 mg daily for 6 weeks and Carafate for 1 week. -Ambulatory referral to GI ordered.  Spiculated right upper lobe lung mass measuring 2.1 x 1.7 cm: Noted on CTA chest.  Concern for bronchogenic carcinoma given his smoking history.  Reports chronic "smoker's cough" but no hemoptysis. -Outpatient follow-up with oncology and pulmonology as above  Chronic COPD: Stable. -Started on Dulera. -As needed albuterol.  Essential hypertension: BP slightly elevated likely due to IV fluid -Discontinued IV fluid. -Continue home losartan/HCTZ  Hyperlipidemia: -Continue home statin  Tobacco use:1 PPD but quit 3 weeks ago. -Congratulated  Leukocytosis: Concentration from dehydration?  No evidence of infection.  Resolving. -Recheck CBC at follow-up.  Neck pain syndrome/pleuritic chest pain: CTA chest negative for PE. -Renewed his home tramadol 50 mg 3 times daily as needed's moderate to severe pain, #9.  Family communication: Updated patient's daughter and son-in-law at bedside.  Body mass index is 24.14 kg/m.            Discharge Exam: Vitals:   05/17/20 1200 05/17/20 1548  BP:  (!) 150/88  Pulse:  (!) 103  Resp:  18 20  Temp:  99.7 F (37.6 C)  SpO2: 98% 95%    GENERAL: No apparent distress.  Nontoxic. HEENT: MMM.  Vision and hearing grossly intact.  NECK: Supple.  No apparent JVD.  RESP: On RA.  No IWOB.  Fair aeration bilaterally. CVS:  RRR.  Heart sounds normal.  ABD/GI/GU: Bowel sounds present. Soft. Non tender.  MSK/EXT:  Moves extremities. No apparent deformity. No edema.  SKIN: no apparent skin lesion or wound NEURO: Awake, alert and oriented appropriately.  No apparent focal neuro deficit. PSYCH: Calm. Normal affect.  Discharge Instructions  Discharge Instructions    Ambulatory referral to Gastroenterology   Complete by: As directed    Dysphagia/odynophagia   Ambulatory referral to Pulmonology   Complete by: As directed    Reason for referral: Lung Mass/Lung Nodule   Call MD for:  persistant dizziness or light-headedness   Complete by: As directed    Call MD for:  persistant nausea and vomiting   Complete by: As directed    Call MD for:  severe uncontrolled pain   Complete by: As directed    Call MD for:  temperature >100.4   Complete by: As directed    Diet - low sodium heart healthy   Complete by: As directed    Discharge instructions   Complete by: As directed    It has been a pleasure taking care of you!  You were hospitalized due to difficulty swelling and pain with swallowing.  This is likely due to inflammation of the esophagus (esophagitis) and acid reflux.  You have been started on medication for this.  We have also ordered referral to gastroenterology for further evaluation and care if no improvement with medications.  The CT of your chest also revealed a lung nodule.  Oncology will arrange outpatient follow-up for further evaluation and care on this.  We have also ordered referral to pulmonology.    We may have started you on other new medications or made some changes to your home medications during this hospitalization. Please review your new medication list and the directions carefully before you take them.    Please go to your hospital follow-up appointments or call to reschedule as recommended.   Take care,   Increase activity slowly   Complete by: As directed      Allergies as of  05/17/2020   No Known Allergies     Medication List    TAKE these medications   albuterol 108 (90 Base) MCG/ACT inhaler Commonly known as: VENTOLIN HFA Inhale 1-2 puffs into the lungs every 6 (six) hours as needed for wheezing or shortness of breath. What changed: how much to take   albuterol 108 (90 Base) MCG/ACT inhaler Commonly known as: VENTOLIN HFA Inhale 2 puffs into the lungs every 6 (six) hours as needed for wheezing or shortness of breath. What changed: You were already taking a medication with the same name, and this prescription was added. Make sure you understand how and when to take each.   gabapentin 300 MG capsule Commonly known as: NEURONTIN Take 300 mg by mouth 2 (two) times daily.   losartan-hydrochlorothiazide 50-12.5 MG tablet Commonly known as: HYZAAR Take 1 tablet by mouth daily.   mometasone-formoterol 200-5 MCG/ACT Aero Commonly known as: DULERA Inhale 2 puffs into the lungs 2 (two) times daily.   pantoprazole 40 MG tablet Commonly known as: Protonix Take 1 tablet (40 mg total) by mouth daily.   rosuvastatin 10 MG tablet Commonly known  as: CRESTOR Take 10 mg by mouth daily.   sucralfate 1 g tablet Commonly known as: Carafate Take 1 tablet (1 g total) by mouth 4 (four) times daily -  with meals and at bedtime for 7 days.   tamsulosin 0.4 MG Caps capsule Commonly known as: FLOMAX Take 0.4 mg by mouth every evening.   traMADol 50 MG tablet Commonly known as: ULTRAM Take 1 tablet (50 mg total) by mouth every 8 (eight) hours as needed for up to 3 days for moderate pain or severe pain. What changed:   when to take this  reasons to take this       Consultations:  Oncology  Procedures/Studies:   DG Chest 2 View  Result Date: 05/16/2020 CLINICAL DATA:  Dysphagia, chest pain EXAM: CHEST - 2 VIEW COMPARISON:  05/02/2020 small FINDINGS: The heart size and mediastinal contours are within normal limits. Atherosclerotic calcification of the  aortic knob. Mildly hyperinflated lungs. Streaky interstitial opacity within the right upper lobe. No pleural effusion or pneumothorax. The visualized skeletal structures are unremarkable. IMPRESSION: 1. Streaky interstitial opacity within the right upper lobe suspicious for developing infection. 2. Mildly hyperinflated lungs, suggesting underlying COPD. Electronically Signed   By: [name] D.O.   On: 05/16/2020 08:25   CT Angio Chest PE W and/or Wo Contrast  Result Date: 05/16/2020 CLINICAL DATA:  Concern for pill stuck in throat for 2 days, now with pain with swallowing EXAM: CT ANGIOGRAPHY CHEST WITH CONTRAST TECHNIQUE: Multidetector CT imaging of the chest was performed using the standard protocol during bolus administration of intravenous contrast. Multiplanar CT image reconstructions and MIPs were obtained to evaluate the vascular anatomy. CONTRAST:  70mL OMNIPAQUE IOHEXOL 350 MG/ML SOLN COMPARISON:  Radiograph 05/16/2020 FINDINGS: Cardiovascular: Satisfactory opacification the pulmonary arteries to the segmental level. No pulmonary artery filling defects are identified. Central pulmonary arteries are normal caliber. Atherosclerotic plaque within the normal caliber aorta. No acute luminal abnormality nor periaortic stranding or hemorrhage. Normal 3 vessel branching of the aortic arch. Atheromatous plaque in the otherwise normal proximal great vessels. Normal heart size. No pericardial effusion. Coronary artery atherosclerosis is present. Mediastinum/Nodes: No mediastinal fluid or gas. Normal thyroid gland and thoracic inlet. No acute abnormality of the trachea. Diffuse circumferential thickening of the thoracic esophagus without para esophageal fluid, gas or adenopathy. No worrisome mediastinal, hilar or axillary adenopathy. Lungs/Pleura: Corresponding to the streaky opacity present in the right upper lobe on comparison chest radiograph spiculated nodule in the right lung apex measuring 2.1 x 1.7 cm  in size highly conspicuous for a bronchogenic carcinoma. Additional subpleural 3 mm nodule in the posterior segment right upper lobe (5/34). 2 mm nodule along the left major fissure in the left upper lobe (5/44). No other conspicuous nodules or masses. Background of centrilobular and paraseptal emphysema in the lungs. No consolidation, features of edema, pneumothorax, or effusion. Upper Abdomen: 2.7 x 1.5 cm heterogeneously enhancing which appears to arise from the adrenal gland concerning for potential metastatic disease. No other acute upper abdominal abnormality. Musculoskeletal: The osseous structures appear diffusely demineralized which may limit detection of small or nondisplaced fractures. No acute osseous abnormality or suspicious osseous lesion. Review of the MIP images confirms the above findings. IMPRESSION: 1. No evidence of pulmonary embolism. 2. Corresponding to the streaky opacity present in the right upper lobe on comparison chest radiograph, spiculated nodule in the right lung apex measuring 2.1 x 1.7 cm in size highly conspicuous for a bronchogenic carcinoma. Consider referral to multi disciplinary  case conference for further management decisions. 3. 2.7 x 1.5 cm heterogeneously enhancing which appears to arise from the right adrenal gland concerning for potential metastatic disease. 4. Diffuse circumferential thickening of the thoracic esophagus, likely to correlate with patient's symptoms of esophagitis. 5. Aortic Atherosclerosis (ICD10-I70.0). 6. 7. These results were called by telephone at the time of interpretation on 05/16/2020 at 3:10 pm to provider [name] , who verbally acknowledged these results. Electronically Signed   By: Lovena Le M.D.   On: 05/16/2020 15:10   DG ESOPHAGUS W DOUBLE CM (HD)  Result Date: 05/17/2020 CLINICAL DATA:  Odynophagia.  Difficulty swallowing. EXAM: ESOPHOGRAM / BARIUM SWALLOW / BARIUM TABLET STUDY TECHNIQUE: Combined double contrast and single  contrast examination performed using effervescent crystals, thick barium liquid, and thin barium liquid. The patient was observed with fluoroscopy swallowing a 13 mm barium sulphate tablet. FLUOROSCOPY TIME:  Fluoroscopy Time:  1 minutes 6 seconds Radiation Exposure Index (if provided by the fluoroscopic device): 3.7 mGy Number of Acquired Spot Images: 0 COMPARISON:  None. FINDINGS: Limited evaluation secondary to patient motion resulting from pain. Normal pharyngeal anatomy and motility. Contrast flowed freely through the esophagus without evidence of a stricture or mass. Mild mucosal thickening and irregularity without ulceration. Esophageal motility was normal. Mild gastroesophageal reflux. No definite hiatal hernia was demonstrated. At the end of the examination a 13 mm barium tablet was administered which transited through the esophagus and esophagogastric junction without delay. IMPRESSION: 1. Mild mucosal thickening and irregularity without ulceration as can be seen with mild esophagitis. 2. Mild gastroesophageal reflux. 3. No esophageal stricture. Electronically Signed   By: Kathreen Devoid   On: 05/17/2020 08:32       The results of significant diagnostics from this hospitalization (including imaging, microbiology, ancillary and laboratory) are listed below for reference.     Microbiology: Recent Results (from the past 240 hour(s))  SARS Coronavirus 2 by RT PCR (hospital order, performed in [facility] hospital lab) Nasopharyngeal Nasopharyngeal Swab     Status: None   Collection Time: 05/16/20  6:31 PM   Specimen: Nasopharyngeal Swab  Result Value Ref Range Status   SARS Coronavirus 2 NEGATIVE NEGATIVE Final    Comment: (NOTE) SARS-CoV-2 target nucleic acids are NOT DETECTED.  The SARS-CoV-2 RNA is generally detectable in upper and lower respiratory specimens during the acute phase of infection. The lowest concentration of SARS-CoV-2 viral copies this assay can detect is 250 copies / mL.  A negative result does not preclude SARS-CoV-2 infection and should not be used as the sole basis for treatment or other patient management decisions.  A negative result may occur with improper specimen collection / handling, submission of specimen other than nasopharyngeal swab, presence of viral mutation(s) within the areas targeted by this assay, and inadequate number of viral copies (<250 copies / mL). A negative result must be combined with clinical observations, patient history, and epidemiological information.  Fact Sheet for Patients:   StrictlyIdeas.no  Fact Sheet for Healthcare Providers: BankingDealers.co.za  This test is not yet approved or  cleared by the [place] FDA and has been authorized for detection and/or diagnosis of SARS-CoV-2 by FDA under an Emergency Use Authorization (EUA).  This EUA will remain in effect (meaning this test can be used) for the duration of the COVID-19 declaration under Section 564(b)(1) of the Act, 21 U.S.C. section 360bbb-3(b)(1), unless the authorization is terminated or revoked sooner.  Performed at [facility], [place], [place]  [address]      Labs: BNP (last 3 results) No results for input(s): BNP in the last 8760 hours. Basic Metabolic Panel: Recent Labs  Lab 05/16/20 0805 05/16/20 1354 05/17/20 0511  NA 139  --  143  K 4.0  --  4.9  CL 100  --  103  CO2 28  --  31  GLUCOSE 133*  --  110*  BUN 9  --  10  CREATININE 1.01  --  1.15  CALCIUM 9.8  --  8.7*  MG  --  2.0  --    Liver Function Tests: Recent Labs  Lab 05/16/20 1354  AST 21  ALT 16  ALKPHOS 103  BILITOT 0.6  PROT 7.4  ALBUMIN 3.4*   Recent Labs  Lab 05/16/20 1354  LIPASE 32   No results for input(s): AMMONIA in the last 168 hours. CBC: Recent Labs  Lab 05/16/20 0805 05/17/20 0511  WBC 16.3* 11.0*  HGB 13.1 11.4*  HCT 41.7 35.4*  MCV 98.1 97.0  PLT 376 275    Cardiac Enzymes: No results for input(s): CKTOTAL, CKMB, CKMBINDEX, TROPONINI in the last 168 hours. BNP: Invalid input(s): POCBNP CBG: No results for input(s): GLUCAP in the last 168 hours. D-Dimer No results for input(s): DDIMER in the last 72 hours. Hgb A1c No results for input(s): HGBA1C in the last 72 hours. Lipid Profile No results for input(s): CHOL, HDL, LDLCALC, TRIG, CHOLHDL, LDLDIRECT in the last 72 hours. Thyroid function studies No results for input(s): TSH, T4TOTAL, T3FREE, THYROIDAB in the last 72 hours.  Invalid input(s): FREET3 Anemia work up No results for input(s): VITAMINB12, FOLATE, FERRITIN, TIBC, IRON, RETICCTPCT in the last 72 hours. Urinalysis No results found for: COLORURINE, APPEARANCEUR, LABSPEC, PHURINE, GLUCOSEU, HGBUR, BILIRUBINUR, KETONESUR, PROTEINUR, UROBILINOGEN, NITRITE, LEUKOCYTESUR Sepsis Labs Invalid input(s): PROCALCITONIN,  WBC,  LACTICIDVEN   Time coordinating discharge: 35 minutes  SIGNED:  [name], MD  [facility] 05/17/2020, 5:47 PM  If 7PM-7AM, please contact night-coverage [email or web address] Password [email or web address]

## 2020-05-17 NOTE — Consult Note (Signed)
[facility]  Date of admission:  05/16/2020  Inpatient day:  05/17/2020  Consulting physician: Dr Wendee Beavers  Reason for Consultation:  Right upper lobe lung mass  Chief Complaint: Douglas [name] is a 64 y.o. male with COPD who was admitted through the emergency room with dysphagia.  HPI: The patient notes a 50-pack-year smoking history (1 pack a day for 50 years).  He stopped smoking about 2 weeks ago.  He states until recently he has never gone to the doctor.  About a week ago, he noted some shortness of breath.  CXR showed a lesion.  He states that about 2 days ago he began to have difficulty swallowing.  Foods apparently felt like it was getting stuck in his lower chest.  His oral intake had decreased.  He presented to the [facility] emergency room.  CXR on 05/16/2020 revealed tricky interstitial opacity within the right upper lobe cyst suspicious for a developing infection.  There is mildly inflated lungs suggesting underlying COPD.  Chest CT angiogram on 05/16/2020 revealed no evidence of pulmonary embolism.  There was a 2.1 x 1.7 cm spiculated nodule in the right lung apex suspicious for bronchogenic carcinoma.  In addition there was a 2.7 x 1.5 cm heterogeneously enhancing lesion in the right adrenal gland concerning for metastatic disease.  There was diffuse circumferential thickening of the thoracic esophagus likely correlating with the symptoms of esophagitis.  Barium swallow on 05/17/2020 revealed mild mucosal thickening and irregularity without ulceration.  There was mild gastroesophageal reflux and no esophageal stricture.  Symptomatically, he notes shortness of breath for approximately a year; shortness of breath has increased over the past 9 months.  He stopped smoking about 2 weeks ago.  He denies any fevers, sweats or weight loss.  He has a chronic cough.  He denies hemoptysis.   Past Medical History:  Diagnosis Date  . Hyperlipidemia   . Hypertension      History reviewed. No pertinent surgical history.  Family History  Problem Relation Age of Onset  . Cancer Neg Hx     Social History:  reports that he has quit smoking. He has never used smokeless tobacco. He reports current alcohol use.  Drug: Marijuana.  He smoked 1 pack a day for 50 years.  He stopped smoking 2 weeks ago.  The patient denies any exposure to radiation or toxins.  He is retired.  He used to cut down trees.  The patient lives in [place].  He is accompanied by his son ("doc") and daughter Judeen Hammans).  Allergies: No Known Allergies  No medications prior to admission.   Review of Systems: GENERAL:  Fatigue.  No fevers, sweats or weight loss. PERFORMANCE STATUS (ECOG):  1 HEENT:  Runny nose.  No visual changessore throat, mouth sores or tenderness. Lungs: Shortness of breath.  Chronic cough.  No hemoptysis. Cardiac:  No chest pain, palpitations, orthopnea, or PND. GI:  Difficulty swallowing.  No nausea, vomiting, diarrhea, constipation, melena or hematochezia. GU:  No urgency, frequency, dysuria, or hematuria. Musculoskeletal:  No back pain.  No joint pain.  No muscle tenderness. Extremities:  No pain or swelling. Skin:  Bruising on arms. No rashes or skin changes. Neuro:  No headache, numbness or weakness, balance or coordination issues. Endocrine:  No diabetes, thyroid issues, hot flashes or night sweats. Psych:  No mood changes, depression or anxiety. Pain:  No focal pain. Review of systems:  All other systems reviewed and found to be negative.  Physical  Exam:  Blood pressure (!) 150/88, pulse (!) 103, temperature 99.7 F (37.6 C), temperature source Oral, resp. rate 20, height 5\' 5"  (1.651 m), weight 145 lb 1 oz (65.8 kg), SpO2 95 %.  GENERAL:  Well developed, well nourished, gentleman sitting comfortably on the medical unit in no acute distress. MENTAL STATUS:  Alert and oriented to person, place and time. HEAD:  Dark shoulder length hair.  Normocephalic,  atraumatic, face symmetric, no Cushingoid features. EYES:  Brown/hazel eyes.  Pupils equal round and reactive to light and accomodation.  No conjunctivitis or scleral icterus. ENT:  Oropharynx clear with sublingual ulcer.  Edentulous.  Tongue normal.   Mucous membranes moist.  RESPIRATORY:  Sonorous breath sounds.  No rales, wheezes or rhonchi. CARDIOVASCULAR:  Regular rate and rhythm without murmur, rub or gallop. ABDOMEN:  Soft, non-tender, with active bowel sounds, and no hepatosplenomegaly.  No masses. SKIN:  No rashes, ulcers or lesions. EXTREMITIES: No edema, no skin discoloration or tenderness.  No palpable cords. LYMPH NODES: No palpable cervical, supraclavicular, axillary or inguinal adenopathy  NEUROLOGICAL: Unremarkable. PSYCH:  Appropriate.   Results for orders placed or performed during the hospital encounter of 05/16/20 (from the past 48 hour(s))  Basic metabolic panel     Status: Abnormal   Collection Time: 05/16/20  8:05 AM  Result Value Ref Range   Sodium 139 135 - 145 mmol/L   Potassium 4.0 3.5 - 5.1 mmol/L   Chloride 100 98 - 111 mmol/L   CO2 28 22 - 32 mmol/L   Glucose, Bld 133 (H) 70 - 99 mg/dL    Comment: Glucose reference range applies only to samples taken after fasting for at least 8 hours.   BUN 9 8 - 23 mg/dL   Creatinine, Ser 1.01 0.61 - 1.24 mg/dL   Calcium 9.8 8.9 - 10.3 mg/dL   GFR calc non Af Amer >60 >60 mL/min   GFR calc Af Amer >60 >60 mL/min   Anion gap 11 5 - 15    Comment: Performed at [facility], [address] [place], [place] [address]  CBC     Status: Abnormal   Collection Time: 05/16/20  8:05 AM  Result Value Ref Range   WBC 16.3 (H) 4.0 - 10.5 K/uL   RBC 4.25 4.22 - 5.81 MIL/uL   Hemoglobin 13.1 13.0 - 17.0 g/dL   HCT 41.7 39 - 52 %   MCV 98.1 80.0 - 100.0 fL   MCH 30.8 26.0 - 34.0 pg   MCHC 31.4 30.0 - 36.0 g/dL   RDW 13.2 11.5 - 15.5 %   Platelets 376 150 - 400 K/uL   nRBC 0.0 0.0 - 0.2 %    Comment: Performed at  [facility], [place], [place] [address]  Troponin I (High Sensitivity)     Status: None   Collection Time: 05/16/20  8:05 AM  Result Value Ref Range   Troponin I (High Sensitivity) 8 <18 ng/L    Comment: (NOTE) Elevated high sensitivity troponin I (hsTnI) values and significant  changes across serial measurements may suggest ACS but many other  chronic and acute conditions are known to elevate hsTnI results.  Refer to the "Links" section for chest pain algorithms and additional  guidance. Performed at [facility], [place], [place] [address]   Troponin I (High Sensitivity)     Status: None   Collection Time: 05/16/20  1:54 PM  Result Value Ref Range  Troponin I (High Sensitivity) 7 <18 ng/L    Comment: (NOTE) Elevated high sensitivity troponin I (hsTnI) values and significant  changes across serial measurements may suggest ACS but many other  chronic and acute conditions are known to elevate hsTnI results.  Refer to the "Links" section for chest pain algorithms and additional  guidance. Performed at [facility], [address] [place], [place] [address]   Hepatic function panel     Status: Abnormal   Collection Time: 05/16/20  1:54 PM  Result Value Ref Range   Total Protein 7.4 6.5 - 8.1 g/dL   Albumin 3.4 (L) 3.5 - 5.0 g/dL   AST 21 15 - 41 U/L   ALT 16 0 - 44 U/L   Alkaline Phosphatase 103 38 - 126 U/L   Total Bilirubin 0.6 0.3 - 1.2 mg/dL   Bilirubin, Direct <0.1 0.0 - 0.2 mg/dL   Indirect Bilirubin NOT CALCULATED 0.3 - 0.9 mg/dL    Comment: Performed at [facility], [address] [place], [place] [address]  Lipase, blood     Status: None   Collection Time: 05/16/20  1:54 PM  Result Value Ref Range   Lipase 32 11 - 51 U/L    Comment: Performed at [facility], [address], [place], [place] [address]  Magnesium     Status: None   Collection Time: 05/16/20  1:54 PM  Result Value  Ref Range   Magnesium 2.0 1.7 - 2.4 mg/dL    Comment: Performed at [facility], [address] [place], [place] [address]  SARS Coronavirus 2 by RT PCR (hospital order, performed in [facility] hospital lab) Nasopharyngeal Nasopharyngeal Swab     Status: None   Collection Time: 05/16/20  6:31 PM   Specimen: Nasopharyngeal Swab  Result Value Ref Range   SARS Coronavirus 2 NEGATIVE NEGATIVE    Comment: (NOTE) SARS-CoV-2 target nucleic acids are NOT DETECTED.  The SARS-CoV-2 RNA is generally detectable in upper and lower respiratory specimens during the acute phase of infection. The lowest concentration of SARS-CoV-2 viral copies this assay can detect is 250 copies / mL. A negative result does not preclude SARS-CoV-2 infection and should not be used as the sole basis for treatment or other patient management decisions.  A negative result may occur with improper specimen collection / handling, submission of specimen other than nasopharyngeal swab, presence of viral mutation(s) within the areas targeted by this assay, and inadequate number of viral copies (<250 copies / mL). A negative result must be combined with clinical observations, patient history, and epidemiological information.  Fact Sheet for Patients:   StrictlyIdeas.no  Fact Sheet for Healthcare Providers: BankingDealers.co.za  This test is not yet approved or  cleared by the [place] FDA and has been authorized for detection and/or diagnosis of SARS-CoV-2 by FDA under an Emergency Use Authorization (EUA).  This EUA will remain in effect (meaning this test can be used) for the duration of the COVID-19 declaration under Section 564(b)(1) of the Act, 21 U.S.C. section 360bbb-3(b)(1), unless the authorization is terminated or revoked sooner.  Performed at [facility], [address] [place], [place] [address]   CBC     Status: Abnormal   Collection  Time: 05/17/20  5:11 AM  Result Value Ref Range   WBC 11.0 (H) 4.0 - 10.5 K/uL   RBC 3.65 (L) 4.22 - 5.81 MIL/uL   Hemoglobin 11.4 (L) 13.0 - 17.0 g/dL   HCT 35.4 (L)  39 - 52 %   MCV 97.0 80.0 - 100.0 fL   MCH 31.2 26.0 - 34.0 pg   MCHC 32.2 30.0 - 36.0 g/dL   RDW 13.3 11.5 - 15.5 %   Platelets 275 150 - 400 K/uL   nRBC 0.0 0.0 - 0.2 %    Comment: Performed at [facility], [address] [place], [place] [address]  Basic metabolic panel     Status: Abnormal   Collection Time: 05/17/20  5:11 AM  Result Value Ref Range   Sodium 143 135 - 145 mmol/L   Potassium 4.9 3.5 - 5.1 mmol/L   Chloride 103 98 - 111 mmol/L   CO2 31 22 - 32 mmol/L   Glucose, Bld 110 (H) 70 - 99 mg/dL    Comment: Glucose reference range applies only to samples taken after fasting for at least 8 hours.   BUN 10 8 - 23 mg/dL   Creatinine, Ser 1.15 0.61 - 1.24 mg/dL   Calcium 8.7 (L) 8.9 - 10.3 mg/dL   GFR calc non Af Amer >60 >60 mL/min   GFR calc Af Amer >60 >60 mL/min   Anion gap 9 5 - 15    Comment: Performed at [facility], [address] [place], [place] [address]  HIV Antibody (routine testing w rflx)     Status: None   Collection Time: 05/17/20  5:11 AM  Result Value Ref Range   HIV Screen 4th Generation wRfx Non Reactive Non Reactive    Comment: Performed at [facility], [place] [address], [place],  [address]   DG Chest 2 View  Result Date: 05/16/2020 CLINICAL DATA:  Dysphagia, chest pain EXAM: CHEST - 2 VIEW COMPARISON:  05/02/2020 small FINDINGS: The heart size and mediastinal contours are within normal limits. Atherosclerotic calcification of the aortic knob. Mildly hyperinflated lungs. Streaky interstitial opacity within the right upper lobe. No pleural effusion or pneumothorax. The visualized skeletal structures are unremarkable. IMPRESSION: 1. Streaky interstitial opacity within the right upper lobe suspicious for developing infection. 2. Mildly hyperinflated lungs,  suggesting underlying COPD. Electronically Signed   By: [name] D.O.   On: 05/16/2020 08:25   CT Angio Chest PE W and/or Wo Contrast  Result Date: 05/16/2020 CLINICAL DATA:  Concern for pill stuck in throat for 2 days, now with pain with swallowing EXAM: CT ANGIOGRAPHY CHEST WITH CONTRAST TECHNIQUE: Multidetector CT imaging of the chest was performed using the standard protocol during bolus administration of intravenous contrast. Multiplanar CT image reconstructions and MIPs were obtained to evaluate the vascular anatomy. CONTRAST:  45mL OMNIPAQUE IOHEXOL 350 MG/ML SOLN COMPARISON:  Radiograph 05/16/2020 FINDINGS: Cardiovascular: Satisfactory opacification the pulmonary arteries to the segmental level. No pulmonary artery filling defects are identified. Central pulmonary arteries are normal caliber. Atherosclerotic plaque within the normal caliber aorta. No acute luminal abnormality nor periaortic stranding or hemorrhage. Normal 3 vessel branching of the aortic arch. Atheromatous plaque in the otherwise normal proximal great vessels. Normal heart size. No pericardial effusion. Coronary artery atherosclerosis is present. Mediastinum/Nodes: No mediastinal fluid or gas. Normal thyroid gland and thoracic inlet. No acute abnormality of the trachea. Diffuse circumferential thickening of the thoracic esophagus without para esophageal fluid, gas or adenopathy. No worrisome mediastinal, hilar or axillary adenopathy. Lungs/Pleura: Corresponding to the streaky opacity present in the right upper lobe on comparison chest radiograph spiculated nodule in the right lung apex measuring 2.1 x 1.7 cm in size highly conspicuous for a bronchogenic carcinoma. Additional  subpleural 3 mm nodule in the posterior segment right upper lobe (5/34). 2 mm nodule along the left major fissure in the left upper lobe (5/44). No other conspicuous nodules or masses. Background of centrilobular and paraseptal emphysema in the lungs. No  consolidation, features of edema, pneumothorax, or effusion. Upper Abdomen: 2.7 x 1.5 cm heterogeneously enhancing which appears to arise from the adrenal gland concerning for potential metastatic disease. No other acute upper abdominal abnormality. Musculoskeletal: The osseous structures appear diffusely demineralized which may limit detection of small or nondisplaced fractures. No acute osseous abnormality or suspicious osseous lesion. Review of the MIP images confirms the above findings. IMPRESSION: 1. No evidence of pulmonary embolism. 2. Corresponding to the streaky opacity present in the right upper lobe on comparison chest radiograph, spiculated nodule in the right lung apex measuring 2.1 x 1.7 cm in size highly conspicuous for a bronchogenic carcinoma. Consider referral to multi disciplinary case conference for further management decisions. 3. 2.7 x 1.5 cm heterogeneously enhancing which appears to arise from the right adrenal gland concerning for potential metastatic disease. 4. Diffuse circumferential thickening of the thoracic esophagus, likely to correlate with patient's symptoms of esophagitis. 5. Aortic Atherosclerosis (ICD10-I70.0). 6. 7. These results were called by telephone at the time of interpretation on 05/16/2020 at 3:10 pm to provider [name] , who verbally acknowledged these results. Electronically Signed   By: Lovena Le M.D.   On: 05/16/2020 15:10   DG ESOPHAGUS W DOUBLE CM (HD)  Result Date: 05/17/2020 CLINICAL DATA:  Odynophagia.  Difficulty swallowing. EXAM: ESOPHOGRAM / BARIUM SWALLOW / BARIUM TABLET STUDY TECHNIQUE: Combined double contrast and single contrast examination performed using effervescent crystals, thick barium liquid, and thin barium liquid. The patient was observed with fluoroscopy swallowing a 13 mm barium sulphate tablet. FLUOROSCOPY TIME:  Fluoroscopy Time:  1 minutes 6 seconds Radiation Exposure Index (if provided by the fluoroscopic device): 3.7 mGy Number  of Acquired Spot Images: 0 COMPARISON:  None. FINDINGS: Limited evaluation secondary to patient motion resulting from pain. Normal pharyngeal anatomy and motility. Contrast flowed freely through the esophagus without evidence of a stricture or mass. Mild mucosal thickening and irregularity without ulceration. Esophageal motility was normal. Mild gastroesophageal reflux. No definite hiatal hernia was demonstrated. At the end of the examination a 13 mm barium tablet was administered which transited through the esophagus and esophagogastric junction without delay. IMPRESSION: 1. Mild mucosal thickening and irregularity without ulceration as can be seen with mild esophagitis. 2. Mild gastroesophageal reflux. 3. No esophageal stricture. Electronically Signed   By: Kathreen Devoid   On: 05/17/2020 08:32    Assessment:  The patient is a 64 y.o. gentleman with a 50-pack-year smoking history and a RUL mass.  Chest CT angiogram on 05/16/2020 revealed no evidence of pulmonary embolism.  There was a 2.1 x 1.7 cm spiculated nodule in the right lung apex suspicious for bronchogenic carcinoma.  In addition there was a 2.7 x 1.5 cm heterogeneously enhancing lesion in the right adrenal gland concerning for metastatic disease.  There was diffuse circumferential thickening of the thoracic esophagus likely correlating with the symptoms of esophagitis.  Barium swallow on 05/17/2020 revealed mild mucosal thickening and irregularity without ulceration.  There was mild gastroesophageal reflux and no esophageal stricture.  Symptomatically, he notes increased shortness of breath.  He denies any fevers, sweats or weight loss.  He has a chronic cough.  Plan:   1.   Right upper lobe mass  Chest CT was personally reviewed.  Agree with radiology findings.     There was a 2.1 cm right upper lobe mass and a 2.7 cm right adrenal lesion worrisome for metastatic lung cancer.  Discuss imaging with patient and his family.  Discuss concern  for lung cancer.  Discuss plan for outpatient PET scan and follow-up biopsy.  Briefly review potential treatment for lung cancer based on staging.   Review treatment for advanced lung cancer based on possible driver mutation.  Discuss plan for head MRI if biopsy confirms lung cancer. 2.   Dysphagia  Etiology of circumferential thickening of the esophagus likely related to esophagitis.  Follow-up with GI 3.   Disposition  Patient scheduled to be discharged today.  Plan to schedule outpatient PET scan then follow-up in clinic.  The patient was provided my business card.  He will be contacted on Monday, 05/20/2020, for his appointments.  Thank you for allowing me to participate in Douglas [name] 's care.  I will follow him closely with you while hospitalized and after discharge in the outpatient department.   [name], MD  05/17/2020, 8:19 PM

## 2020-05-18 ENCOUNTER — Other Ambulatory Visit: Payer: Self-pay | Admitting: Hematology and Oncology

## 2020-05-18 DIAGNOSIS — R918 Other nonspecific abnormal finding of lung field: Secondary | ICD-10-CM

## 2020-05-18 DIAGNOSIS — E278 Other specified disorders of adrenal gland: Secondary | ICD-10-CM

## 2020-05-22 ENCOUNTER — Other Ambulatory Visit: Payer: Self-pay

## 2020-05-22 ENCOUNTER — Encounter
Admission: RE | Admit: 2020-05-22 | Discharge: 2020-05-22 | Disposition: A | Discharge status: Home / Self Care | Payer: Medicare Other | Source: Ambulatory Visit | Attending: Hematology and Oncology | Admitting: Hematology and Oncology

## 2020-05-22 DIAGNOSIS — R918 Other nonspecific abnormal finding of lung field: Secondary | ICD-10-CM | POA: Insufficient documentation

## 2020-05-22 DIAGNOSIS — J439 Emphysema, unspecified: Secondary | ICD-10-CM | POA: Insufficient documentation

## 2020-05-22 DIAGNOSIS — I700 Atherosclerosis of aorta: Secondary | ICD-10-CM | POA: Insufficient documentation

## 2020-05-22 DIAGNOSIS — E278 Other specified disorders of adrenal gland: Secondary | ICD-10-CM | POA: Insufficient documentation

## 2020-05-22 DIAGNOSIS — I2510 Atherosclerotic heart disease of native coronary artery without angina pectoris: Secondary | ICD-10-CM | POA: Diagnosis not present

## 2020-05-22 LAB — GLUCOSE, CAPILLARY: Glucose-Capillary: 94 mg/dL (ref 70–99)

## 2020-05-22 MED ORDER — FLUDEOXYGLUCOSE F - 18 (FDG) INJECTION
7.5000 | Freq: Once | INTRAVENOUS | Status: AC | PRN
  Administered 2020-05-22: 7.84 via INTRAVENOUS

## 2020-05-22 NOTE — Progress Notes (Signed)
[facility]  [address] [place], [place] [address] Phone: [phone]  Fax: [phone]   Clinic Day:  05/23/2020  Referring physician: [name], MD  Chief Complaint: Douglas [name] is a 64 y.o. male with a right upper lobe lung mass who is seen for review of interval PET scan and discussion regarding direction of therapy.   HPI: The patient has a 50-pack-year smoking history (1 pack a day for 50 years).  He stopped smoking in 04/2020. Until recently, he had never gone to the doctor.  The patient was admitted to [facility] from 05/16/2020 to 05/17/2020. He presented with 2-3 days of dysphagia and odynophagia with solid and liquids and felt like it was getting stuck in his lower chest. He endorsed sensation of pills getting stuck in his lower chest/upper abdomen.He had unintentional weight loss but could notquantify. He denied fever, chills, excessive night sweats, melena, hematochezia or diarrhea.   CXR on 05/16/2020 revealed streaky interstitial opacity within the right upper lobe cyst suspicious for a developing infection.  There was mildly inflated lungs suggesting underlying COPD.  Chest CT angiogram on 05/16/2020 revealed no evidence of pulmonary embolism.  There was a 2.1 x 1.7 cm spiculated nodule in the right lung apex suspicious for bronchogenic carcinoma.  In addition, there was a 2.7 x 1.5 cm heterogeneously enhancing lesion in the right adrenal gland concerning for metastatic disease.  There was diffuse circumferential thickening of the thoracic esophagus likely correlating with the symptoms of esophagitis.  He was seen in consultation on 05/17/2020. We discussed imaging studies concerning for lung cancer. We discussed plans for outpatient PET scan followed by biopsy.  Barium swallow on 05/17/2020 revealed mild mucosal thickening and irregularity without ulceration.  There was mild gastroesophageal reflux and no esophageal stricture.   He was discharged on oral Protonix 40 mg daily for 6 weeks, and Carafate x 1 week.  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  There was focal hypermetabolism in the sellar region, without a definite CT correlate. There was new peribronchovascular ground-glass in the superior segment left lower lobe, likely infectious or inflammatory in etiology.  There was aortic atherosclerosis, coronary artery calcification, and emphysema.  Symptomatically, he feels awful.  He describes diffuse pain.  Weight is down 10 lbs since 05/17/2020. He has no appetite. He has not eaten in 5 days, but has been drinking fluids like water, Gatorade, and chocolate milk. He continues to have trouble swallowing solids. He reports trying to eat a banana but had to spit it out because it was too painful. He has no trouble swallowing fluids. He has not tried Ensure or Boost. He is willing to eat baby food to get something in his stomach along with nutritional supplements. He has some diarrhea associated x 2 a day for 3 days. Patient is agreeable to speak with Jennet Maduro, RD.   He reports weakness from not eating in 5 days. He also reports that his balance and coordination is off. He has bruising on his forearms. He bruises easily.   He has whole body pain (10/10). Pain in his neck radiates to down through his shoulders, arms, ribs, back all the way to his ankles. He reports injury to his lumbar spine s/p fall. He is not taking any pain medication. He was given Tramadol  but he feels like it was not helpful.   He described his breathing as terrible which he attributes to COPD. Breathing is unchanged from his recent hospitalization. He is using the nebulizer and albuterol without any improvement. He is coughing  with phlegm production. He denies any hemoptysis.  He has not smoked any cigarettes.  His daughter, Douglas [name], reports that GI has not contacted him yet. He is set to see pulmonary medicine on 05/28/2020.   We discussed code status.  Patient reports DNR/DNI.     Past Medical History:  Diagnosis Date  . Hyperlipidemia   . Hypertension     History reviewed. No pertinent surgical history.  Family History  Problem Relation Age of Onset  . Cancer Neg Hx     Social History:  reports that he has quit smoking. He has never used smokeless tobacco. He reports current alcohol use.  Drug: Marijuana. He smoked 1 pack a day for 50 years.  He stopped smoking 2 weeks ago.  The patient denies any exposure to radiation or toxins.  He is retired.  He used to cut down trees.  The patient lives in [place]. He has son ("Douglas [name]") and a daughter Douglas [name]). The patient is accompanied by Douglas [name] and Douglas [name] today via iPad.  Allergies: No Known Allergies  Current Medications: Current Outpatient Medications  Medication Sig Dispense Refill  . albuterol (VENTOLIN HFA) 108 (90 Base) MCG/ACT inhaler Inhale 1-2 puffs into the lungs every 6 (six) hours as needed for wheezing or shortness of breath. 6.7 g 0  . albuterol (VENTOLIN HFA) 108 (90 Base) MCG/ACT inhaler Inhale 2 puffs into the lungs every 6 (six) hours as needed for wheezing or shortness of breath. 18 g 1  . gabapentin (NEURONTIN) 300 MG capsule Take 300 mg by mouth 2 (two) times daily.    Marland Kitchen losartan-hydrochlorothiazide (HYZAAR) 50-12.5 MG tablet Take 1 tablet by mouth daily.    . mometasone-formoterol (DULERA) 200-5 MCG/ACT AERO Inhale 2 puffs into the lungs 2 (two) times daily. 8.8 g 1  . pantoprazole (PROTONIX) 40 MG tablet Take 1 tablet (40 mg total) by mouth daily. 90 tablet 0  . rosuvastatin (CRESTOR) 10 MG tablet Take 10 mg by mouth daily.    . sucralfate (CARAFATE) 1 g tablet Take 1 tablet (1 g total) by mouth 4 (four) times daily -  with meals and at bedtime for  7 days. 28 tablet 0  . tamsulosin (FLOMAX) 0.4 MG CAPS capsule Take 0.4 mg by mouth every evening.     No current facility-administered medications for this visit.    Review of Systems  Constitutional: Positive for weight loss (10 lbs). Negative for chills, diaphoresis, fever and malaise/fatigue.       Feels "awful and is in extreme pain".  HENT: Negative for congestion, ear discharge, ear pain, hearing loss, nosebleeds, sinus pain, sore throat and tinnitus.   Eyes: Negative for blurred vision.  Respiratory: Positive for cough, sputum production (phlegm) and shortness of breath. Negative for hemoptysis.        COPD. Using nebulizer and albuterol.  Cardiovascular: Positive for chest pain (rib cage). Negative for palpitations, orthopnea, claudication and leg swelling.  Gastrointestinal: Positive for diarrhea (x 2-3 for 3 days). Negative for abdominal pain, blood in stool, constipation, heartburn, melena, nausea and vomiting.       No appetite. Drinking Gatorade, water and chocolate milk. Severe dysphagia.  Genitourinary: Negative for dysuria, frequency, hematuria and urgency.  Musculoskeletal: Positive for back pain (lumbar spine), joint  pain (ankles), myalgias and neck pain.       Whole body pain (10/10).  Skin: Negative for itching and rash.  Neurological: Positive for weakness (related to not eating). Negative for dizziness, tingling, sensory change and headaches.       Balance and coordination off.  Endo/Heme/Allergies: Bruises/bleeds easily (forearm bruising).  Psychiatric/Behavioral: Negative for depression and memory loss. The patient is nervous/anxious. The patient does not have insomnia.   All other systems reviewed and are negative.   Performance status (ECOG): 2  Vitals Blood pressure (!) 142/80, pulse 92, temperature (!) 96.3 F (35.7 C), temperature source Tympanic, resp. rate (!) 22, weight 135 lb 4 oz (61.4 kg), SpO2 98 %.   Physical Exam Vitals and nursing note  reviewed.  Constitutional:      General: He is not in acute distress.    Appearance: Normal appearance.     Interventions: Face mask in place.  HENT:     Head: Normocephalic and atraumatic.     Comments: Dark shoulder length hair.    Mouth/Throat:     Mouth: Mucous membranes are moist.     Pharynx: Oropharynx is clear.  Eyes:     General: No scleral icterus.    Extraocular Movements: Extraocular movements intact.     Conjunctiva/sclera: Conjunctivae normal.     Pupils: Pupils are equal, round, and reactive to light.     Comments: Brown/hazel eyes.  Cardiovascular:     Rate and Rhythm: Normal rate and regular rhythm.     Pulses: Normal pulses.     Heart sounds: Normal heart sounds. No murmur heard.   Pulmonary:     Effort: Pulmonary effort is normal. No respiratory distress.     Breath sounds: Normal breath sounds. No wheezing or rales.  Chest:     Chest wall: No tenderness.  Abdominal:     General: Bowel sounds are normal. There is no distension.     Palpations: Abdomen is soft. There is no hepatomegaly, splenomegaly or mass.     Tenderness: There is no abdominal tenderness. There is no guarding.  Musculoskeletal:        General: Tenderness (cervical spine and lumbar/sacral spine) present. No swelling. Normal range of motion.     Cervical back: Normal range of motion and neck supple. Tenderness (severe) present.  Lymphadenopathy:     Head:     Right side of head: No preauricular, posterior auricular or occipital adenopathy.     Left side of head: No preauricular, posterior auricular or occipital adenopathy.     Cervical: No cervical adenopathy.     Upper Body:     Right upper body: No supraclavicular or axillary adenopathy.     Left upper body: No supraclavicular or axillary adenopathy.     Lower Body: No right inguinal adenopathy. No left inguinal adenopathy.  Skin:    General: Skin is warm and dry.     Coloration: Skin is not jaundiced.     Findings: No rash.    Neurological:     Mental Status: He is alert and oriented to person, place, and time. Mental status is at baseline.     Cranial Nerves: No cranial nerve deficit.     Sensory: No sensory deficit.     Motor: Weakness (general) present.  Psychiatric:        Mood and Affect: Mood normal.        Behavior: Behavior normal.        Thought Content: Thought content  normal.        Judgment: Judgment normal.    Hospital Outpatient Visit on 05/22/2020  Component Date Value Ref Range Status  . Glucose-Capillary 05/22/2020 94  70 - 99 mg/dL Final   Glucose reference range applies only to samples taken after fasting for at least 8 hours.    Assessment:  TANMAY HALTEMAN is a 64 y.o. male with a 50-pack-year smoking history and a RUL mass.  Chest CT angiogram on 05/16/2020 revealed no evidence of pulmonary embolism.  There was a 2.1 x 1.7 cm spiculated nodule in the right lung apex suspicious for bronchogenic carcinoma.  In addition there was a 2.7 x 1.5 cm heterogeneously enhancing lesion in the right adrenal gland concerning for metastatic disease.  There was diffuse circumferential thickening of the thoracic esophagus likely correlating with the symptoms of esophagitis.  PET scan on 05/22/2020 revealed a hypermetabolic spiculated 1.9 x 2.2 cm (SUV 10.9) right upper lobe nodule with hypermetabolic bilateral mediastinal and hilar lymph nodes (5-7 mm; SUV 3.1 - 4.0), bilateral adrenal lesions (right 1.5 x 2.5 cm; SUV 28.6; left SUV 5.6) and lytic osseous lesions (cervical spine, right scapula (2.9 x 3.4 cm; SUV 41.3), left 10th rib, L3, right sacrum, right pubic bone) c/w stage IV primary bronchogenic carcinoma.  There was focal hypermetabolism in the sellar region, without a definite CT correlate. There was new peribronchovascular ground-glass in the superior segment left lower lobe, likely infectious or inflammatory in etiology.  There was aortic atherosclerosis, coronary artery calcification, and  emphysema.  Barium swallow on 05/17/2020 revealed mild mucosal thickening and irregularity without ulceration.  There was mild gastroesophageal reflux and no esophageal stricture.  Symptomatically, he has diffuse bone pain.  He is eating poorly and losing weight.  Plan: 1.   Probable stage IV lung cancer             Chest CT on 05/16/2020 revealed a 2.1 cm right upper lobe mass and a 2.7 cm right adrenal lesion.             Discuss interval PET scan.  Images personally reviewed with patient.  Agree with radiology interpretation.   There is a 2.2 cm RUL hypermetabolic lesion with associated bilateral mediastinal nodes.   There are bilateral adrenal lesions and multiple lytic bone lesion indicative of metastatic disease.             Discuss plan for biopsy.  Images reviewed with radiology.   Plan for right sacral ala biopsy.   Send tissue for [facility].  Discuss additional imaging given significant upper neck and lower back pain.  Discuss head MRI for complete staging 2.   Bone metastasis  Patient with several symptomatic areas.  Discuss additional imaging studies (MRI) of spine.  Discuss referral to radiation oncology.  Discuss pain management.  Rx: oxycodone 5 mg po q 4 hours prn pain.  Patient to keep a pain diary.  Referral to palliative care medicine.  Patient in agreement.  Discuss Xgeva. 3.   Dysphagia             Etiology of circumferential thickening of the esophagus likely related to esophagitis.             Patient continues to have significant dysphagia despite Protonix.  Referral to GI. 4.   Weight loss  Patient with dysphagia as noted above.  He is able to tolerate liquids.  Discuss supplements with Ensure and Boost.   Patient provided with samples today.  Nutrition consult. 5.   Code status  Discussed with patient.  Patient notes DNR/DNI code status.  MOST form completed.  Additional forms provided patient. 6.   GI consult STAT. 7.   MRI head, cervical  spine, lumbar/sacrum 05/27/2020. 8.   CT guided biopsy 05/27/2020. 9.   Radiation oncology consult. 10.   Palliative care consult. 11.   Tumor board on 05/30/2020. 12.   RTC in 1 week to review work-up and discussion regarding direction of therapy.  I discussed the assessment and treatment plan with the patient.  The patient was provided an opportunity to ask questions and all were answered.  The patient agreed with the plan and demonstrated an understanding of the instructions.  The patient was advised to call back if the symptoms worsen or if the condition fails to improve as anticipated.  I provided 35 minutes of face-to-face time during this this encounter and > 50% was spent counseling as documented under my assessment and plan. An additional 5-6 minutes were spent reviewing his chart (Epic and Care Everywhere) including notes, labs, and imaging studies.    Lowry Bala C. Mike Gip, MD, PhD    05/23/2020, 9:22 AM  I, Selena Batten, am acting as scribe for [employer]. Mike Gip, MD, PhD.  I, Christyann Manolis C. Mike Gip, MD, have reviewed the above documentation for accuracy and completeness, and I agree with the above.

## 2020-05-23 ENCOUNTER — Encounter: Payer: Self-pay | Admitting: Hematology and Oncology

## 2020-05-23 ENCOUNTER — Telehealth: Payer: Self-pay

## 2020-05-23 ENCOUNTER — Inpatient Hospital Stay: Payer: Medicare Other | Attending: Hematology and Oncology | Admitting: Hematology and Oncology

## 2020-05-23 ENCOUNTER — Telehealth: Payer: Self-pay | Admitting: *Deleted

## 2020-05-23 VITALS — BP 142/80 | HR 92 | Temp 96.3°F | Resp 22 | Wt 135.3 lb

## 2020-05-23 DIAGNOSIS — G893 Neoplasm related pain (acute) (chronic): Secondary | ICD-10-CM | POA: Diagnosis not present

## 2020-05-23 DIAGNOSIS — Z7189 Other specified counseling: Secondary | ICD-10-CM | POA: Insufficient documentation

## 2020-05-23 DIAGNOSIS — Z66 Do not resuscitate: Secondary | ICD-10-CM | POA: Insufficient documentation

## 2020-05-23 DIAGNOSIS — R634 Abnormal weight loss: Secondary | ICD-10-CM | POA: Insufficient documentation

## 2020-05-23 DIAGNOSIS — Z515 Encounter for palliative care: Secondary | ICD-10-CM | POA: Insufficient documentation

## 2020-05-23 DIAGNOSIS — Z87891 Personal history of nicotine dependence: Secondary | ICD-10-CM | POA: Insufficient documentation

## 2020-05-23 DIAGNOSIS — E278 Other specified disorders of adrenal gland: Secondary | ICD-10-CM

## 2020-05-23 DIAGNOSIS — C7931 Secondary malignant neoplasm of brain: Secondary | ICD-10-CM | POA: Diagnosis not present

## 2020-05-23 DIAGNOSIS — Z79899 Other long term (current) drug therapy: Secondary | ICD-10-CM | POA: Diagnosis not present

## 2020-05-23 DIAGNOSIS — C801 Malignant (primary) neoplasm, unspecified: Secondary | ICD-10-CM | POA: Diagnosis not present

## 2020-05-23 DIAGNOSIS — C7951 Secondary malignant neoplasm of bone: Secondary | ICD-10-CM | POA: Insufficient documentation

## 2020-05-23 DIAGNOSIS — R970 Elevated carcinoembryonic antigen [CEA]: Secondary | ICD-10-CM | POA: Insufficient documentation

## 2020-05-23 DIAGNOSIS — R1310 Dysphagia, unspecified: Secondary | ICD-10-CM | POA: Insufficient documentation

## 2020-05-23 DIAGNOSIS — E279 Disorder of adrenal gland, unspecified: Secondary | ICD-10-CM | POA: Insufficient documentation

## 2020-05-23 DIAGNOSIS — R918 Other nonspecific abnormal finding of lung field: Secondary | ICD-10-CM | POA: Insufficient documentation

## 2020-05-23 DIAGNOSIS — J449 Chronic obstructive pulmonary disease, unspecified: Secondary | ICD-10-CM | POA: Insufficient documentation

## 2020-05-23 DIAGNOSIS — M899 Disorder of bone, unspecified: Secondary | ICD-10-CM | POA: Insufficient documentation

## 2020-05-23 MED ORDER — OXYCODONE HCL 5 MG PO TABS: 5.0000 mg | ORAL_TABLET | ORAL | 0 refills | Status: DC | PRN

## 2020-05-23 NOTE — Progress Notes (Signed)
The patient c/o not eaten x 5 days, but drinking fluids ( unaware of how much). He reports he is having trouble swallowing everything but liquids.The patient also c/o having  diarrhea and whole body pain ( 10) today.

## 2020-05-23 NOTE — Telephone Encounter (Signed)
[facility] called stating that they cannot fill patient Oxycodone prescription due to out of stock and being on back order. Asking that prescription be sent to another pharmacy

## 2020-05-23 NOTE — Telephone Encounter (Signed)
Orders for CT gudied Biposy has been faxed to scheduling for appointment. Fax conformation confirmed.

## 2020-05-24 ENCOUNTER — Other Ambulatory Visit: Payer: Self-pay

## 2020-05-24 ENCOUNTER — Other Ambulatory Visit: Payer: Self-pay | Admitting: Student

## 2020-05-24 ENCOUNTER — Inpatient Hospital Stay (HOSPITAL_BASED_OUTPATIENT_CLINIC_OR_DEPARTMENT_OTHER): Payer: Medicare Other | Admitting: Hospice and Palliative Medicine

## 2020-05-24 ENCOUNTER — Telehealth: Payer: Self-pay

## 2020-05-24 VITALS — BP 115/77 | HR 87 | Temp 96.7°F | Resp 21

## 2020-05-24 DIAGNOSIS — G893 Neoplasm related pain (acute) (chronic): Secondary | ICD-10-CM | POA: Diagnosis not present

## 2020-05-24 DIAGNOSIS — Z7189 Other specified counseling: Secondary | ICD-10-CM

## 2020-05-24 DIAGNOSIS — Z515 Encounter for palliative care: Secondary | ICD-10-CM | POA: Diagnosis not present

## 2020-05-24 DIAGNOSIS — R918 Other nonspecific abnormal finding of lung field: Secondary | ICD-10-CM | POA: Diagnosis not present

## 2020-05-24 MED ORDER — OXYCODONE HCL ER 10 MG PO T12A: 10.0000 mg | EXTENDED_RELEASE_TABLET | Freq: Two times a day (BID) | ORAL | 0 refills | Status: DC

## 2020-05-24 MED ORDER — SUCRALFATE 1 G PO TABS: 1.0000 g | ORAL_TABLET | Freq: Three times a day (TID) | ORAL | 0 refills | Status: DC

## 2020-05-24 MED ORDER — NALOXONE HCL 4 MG/0.1ML NA LIQD
NASAL | 0 refills | Status: DC
Start: 2020-05-24 — End: 2020-06-12

## 2020-05-24 NOTE — Progress Notes (Signed)
Patient on schedule for Sacral lesion biopsy 05/27/2020, spoke with Sherri/daughter on phone with pre procedure questions given. Made aware to be here @ 0930, NPO after MN prior to procedure and driver post procedure/recovery after. Stated understanding.

## 2020-05-24 NOTE — Telephone Encounter (Signed)
Spoke with the patient daughter Dondra Spry) to inform her that Mr Douglas [name] has been schedule for bx on  Monday 05/27/2020 the [facility] 9:30 for a 10:30 appointment. I have also informed her that Ms Jocelyn Lamer will be contacting you this afternoon for further instruction. I asked if the patient has received the Oxycodone yesterday? She replied yes and his pain has been a lot better today. Ms Dondra Spry was understanding and agreeable.

## 2020-05-24 NOTE — Progress Notes (Signed)
Pt here for Palliative care consult. Has difficulty swallowing. Pt has an appt next week with Dr Allen Norris to evaluate. Started on pain pills yesterday. Slept much bettter. No appetite drinking 3 ensure per day.

## 2020-05-24 NOTE — Progress Notes (Signed)
[place]  [phone] [phone]   Name: NORIS KULINSKI Date: 05/24/2020 MRN: [id number]  DOB: 07-11-1956  Patient Care Team: Patient, No Pcp Per as PCP - General (General Practice)    REASON FOR CONSULTATION: LILBURN STRAW is a 64 y.o. male with multiple medical problems including COPD, tobacco abuse, hypertension, hyperlipidemia, who was hospitalized 05/16/2020-05/17/2020 with dysphagia and unintentional weight loss. Patient was found to have a right lung mass, right adrenal lesion, and diffuse circumferential esophageal thickening on CT. Barium swallow on 05/17/2020 revealed reflux and esophagitis. 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 Patient has been severely symptomatic with pain, weight loss, and poor appetite. He is referred to palliative care to help address goals and manage ongoing symptoms.  SOCIAL HISTORY:     reports that he has quit smoking. He has never used smokeless tobacco. He reports current alcohol use.  Drug: Marijuana.   Patient is a widower.  He lives with his daughter.  Patient also has a son who is involved.  Patient previously worked with a tree service.  ADVANCE DIRECTIVES:  Not on file  CODE STATUS: DNR/DNI (MOST form completed on 05/24/2020)  PAST MEDICAL HISTORY: Past Medical History:  Diagnosis Date  . Hyperlipidemia   . Hypertension     PAST SURGICAL HISTORY: No past surgical history on file.  HEMATOLOGY/ONCOLOGY HISTORY:  Oncology History   No history exists.    ALLERGIES:  has No Known Allergies.  MEDICATIONS:  Current Outpatient Medications  Medication Sig Dispense Refill  . albuterol (VENTOLIN HFA) 108 (90 Base) MCG/ACT inhaler Inhale 1-2 puffs into the lungs every 6  (six) hours as needed for wheezing or shortness of breath. 6.7 g 0  . albuterol (VENTOLIN HFA) 108 (90 Base) MCG/ACT inhaler Inhale 2 puffs into the lungs every 6 (six) hours as needed for wheezing or shortness of breath. 18 g 1  . gabapentin (NEURONTIN) 300 MG capsule Take 300 mg by mouth 2 (two) times daily.    Marland Kitchen losartan-hydrochlorothiazide (HYZAAR) 50-12.5 MG tablet Take 1 tablet by mouth daily.    . mometasone-formoterol (DULERA) 200-5 MCG/ACT AERO Inhale 2 puffs into the lungs 2 (two) times daily. 8.8 g 1  . oxyCODONE (OXY IR/ROXICODONE) 5 MG immediate release tablet Take 1 tablet (5 mg total) by mouth every 4 (four) hours as needed for severe pain. 42 tablet 0  . pantoprazole (PROTONIX) 40 MG tablet Take 1 tablet (40 mg total) by mouth daily. 90 tablet 0  . rosuvastatin (CRESTOR) 10 MG tablet Take 10 mg by mouth daily.    . sucralfate (CARAFATE) 1 g tablet Take 1 tablet (1 g total) by mouth 4 (four) times daily -  with meals and at bedtime for 7 days. 28 tablet 0  . tamsulosin (FLOMAX) 0.4 MG CAPS capsule Take 0.4 mg by mouth every evening.     No current facility-administered medications for this visit.    VITAL SIGNS: There were no vitals taken for this visit. There were no vitals filed for this visit.  Estimated body mass index is 22.51 kg/m as calculated from the following:   Height as of 05/16/20: '5\' 5"'$  (1.651 m).   Weight as of 05/23/20: 135 lb 4 oz (61.4 kg).  LABS: CBC:    Component Value Date/Time   WBC  11.0 (H) 05/17/2020 0511   HGB 11.4 (L) 05/17/2020 0511   HCT 35.4 (L) 05/17/2020 0511   PLT 275 05/17/2020 0511   MCV 97.0 05/17/2020 0511   Comprehensive Metabolic Panel:    Component Value Date/Time   NA 143 05/17/2020 0511   K 4.9 05/17/2020 0511   CL 103 05/17/2020 0511   CO2 31 05/17/2020 0511   BUN 10 05/17/2020 0511   CREATININE 1.15 05/17/2020 0511   GLUCOSE 110 (H) 05/17/2020 0511   CALCIUM 8.7 (L) 05/17/2020 0511   AST 21 05/16/2020 1354   ALT 16  05/16/2020 1354   ALKPHOS 103 05/16/2020 1354   BILITOT 0.6 05/16/2020 1354   PROT 7.4 05/16/2020 1354   ALBUMIN 3.4 (L) 05/16/2020 1354    RADIOGRAPHIC STUDIES: DG Chest 2 View  Result Date: 05/16/2020 CLINICAL DATA:  Dysphagia, chest pain EXAM: CHEST - 2 VIEW COMPARISON:  05/02/2020 small FINDINGS: The heart size and mediastinal contours are within normal limits. Atherosclerotic calcification of the aortic knob. Mildly hyperinflated lungs. Streaky interstitial opacity within the right upper lobe. No pleural effusion or pneumothorax. The visualized skeletal structures are unremarkable. IMPRESSION: 1. Streaky interstitial opacity within the right upper lobe suspicious for developing infection. 2. Mildly hyperinflated lungs, suggesting underlying COPD. Electronically Signed   By: [name] D.O.   On: 05/16/2020 08:25   CT Angio Chest PE W and/or Wo Contrast  Result Date: 05/16/2020 CLINICAL DATA:  Concern for pill stuck in throat for 2 days, now with pain with swallowing EXAM: CT ANGIOGRAPHY CHEST WITH CONTRAST TECHNIQUE: Multidetector CT imaging of the chest was performed using the standard protocol during bolus administration of intravenous contrast. Multiplanar CT image reconstructions and MIPs were obtained to evaluate the vascular anatomy. CONTRAST:  43m OMNIPAQUE IOHEXOL 350 MG/ML SOLN COMPARISON:  Radiograph 05/16/2020 FINDINGS: Cardiovascular: Satisfactory opacification the pulmonary arteries to the segmental level. No pulmonary artery filling defects are identified. Central pulmonary arteries are normal caliber. Atherosclerotic plaque within the normal caliber aorta. No acute luminal abnormality nor periaortic stranding or hemorrhage. Normal 3 vessel branching of the aortic arch. Atheromatous plaque in the otherwise normal proximal great vessels. Normal heart size. No pericardial effusion. Coronary artery atherosclerosis is present. Mediastinum/Nodes: No mediastinal fluid or gas. Normal  thyroid gland and thoracic inlet. No acute abnormality of the trachea. Diffuse circumferential thickening of the thoracic esophagus without para esophageal fluid, gas or adenopathy. No worrisome mediastinal, hilar or axillary adenopathy. Lungs/Pleura: Corresponding to the streaky opacity present in the right upper lobe on comparison chest radiograph spiculated nodule in the right lung apex measuring 2.1 x 1.7 cm in size highly conspicuous for a bronchogenic carcinoma. Additional subpleural 3 mm nodule in the posterior segment right upper lobe (5/34). 2 mm nodule along the left major fissure in the left upper lobe (5/44). No other conspicuous nodules or masses. Background of centrilobular and paraseptal emphysema in the lungs. No consolidation, features of edema, pneumothorax, or effusion. Upper Abdomen: 2.7 x 1.5 cm heterogeneously enhancing which appears to arise from the adrenal gland concerning for potential metastatic disease. No other acute upper abdominal abnormality. Musculoskeletal: The osseous structures appear diffusely demineralized which may limit detection of small or nondisplaced fractures. No acute osseous abnormality or suspicious osseous lesion. Review of the MIP images confirms the above findings. IMPRESSION: 1. No evidence of pulmonary embolism. 2. Corresponding to the streaky opacity present in the right upper lobe on comparison chest radiograph, spiculated nodule in the right lung apex measuring 2.1 x 1.7  cm in size highly conspicuous for a bronchogenic carcinoma. Consider referral to multi disciplinary case conference for further management decisions. 3. 2.7 x 1.5 cm heterogeneously enhancing which appears to arise from the right adrenal gland concerning for potential metastatic disease. 4. Diffuse circumferential thickening of the thoracic esophagus, likely to correlate with patient's symptoms of esophagitis. 5. Aortic Atherosclerosis (ICD10-I70.0). 6. 7. These results were called by telephone  at the time of interpretation on 05/16/2020 at 3:10 pm to provider Shaune Pollack , who verbally acknowledged these results. Electronically Signed   By: Kreg Shropshire M.D.   On: 05/16/2020 15:10   NM PET Image Initial (PI) Skull Base To Thigh  Result Date: 05/22/2020 CLINICAL DATA:  Initial treatment strategy for lung nodule, adrenal mass. EXAM: NUCLEAR MEDICINE PET SKULL BASE TO THIGH TECHNIQUE: 7.8 mCi F-18 FDG was injected intravenously. Full-ring PET imaging was performed from the skull base to thigh after the radiotracer. CT data was obtained and used for attenuation correction and anatomic localization. Fasting blood glucose: 94 mg/dl COMPARISON:  CT chest 32/20/2542. FINDINGS: Mediastinal blood pool activity: SUV max 2.0 Liver activity: SUV max NA NECK: There is focal hypermetabolism in the sellar region, SUV max 8.4, without a definite CT correlate. No hypermetabolic lymph nodes. Incidental CT findings: None. CHEST: Spiculated nodule in the apical segment right upper lobe measures 1.9 x 2.2 cm with an SUV max of 10.9. There is abnormal hypermetabolism associated with apical segmental bronchial wall thickening and mucoid impaction (3/79, 84 and 87), SUV max 3.8. Hypermetabolic right hilar lymph node measures 7 mm (3/95) with an SUV max of 4.0. Right paratracheal lymph node measures 5 mm (3/81) with an SUV max of 3.1. AP window lymph node measures 6 mm (3/81) with an SUV max 4.9. Focal left hilar metabolism has an SUV max of 3.3 and corresponds to a 5 mm lymph node, better seen on contrast infused CT chest 05/16/2020. New peribronchovascular ground-glass in the superior segment left lower lobe (3/97) has an SUV max 2.8. Mild mid esophageal long segment hypermetabolism, without a definite CT correlate. Incidental CT findings: Atherosclerotic calcification of the aorta and coronary arteries. Heart size normal. No pericardial or pleural effusion. Centrilobular and paraseptal emphysema. ABDOMEN/PELVIS: Mildly  hyperattenuating right adrenal mass measures 1.5 x 2.5 cm with an SUV max 28.6. There is focal hypermetabolism in the left adrenal gland, SUV max 5.6, without a definite CT correlate. No abnormal hypermetabolism in the liver, spleen, pancreas or lymph nodes. Incidental CT findings: Liver, gallbladder, kidneys, spleen, pancreas, stomach and bowel are otherwise unremarkable. Atherosclerotic calcification of the aorta without aneurysm. Slight bladder wall thickening. Prostate is normal in size. SKELETON: There are multiple hypermetabolic lytic lesions in the cervical spine, right scapula, posterior left tenth rib, L3, right sacrum and right pubic bone. Index lesion in the right sacral ala measures 2.9 x 3.4 cm (3/194) with an SUV max of 41.3. Incidental CT findings: None. IMPRESSION: 1. 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 2. Focal hypermetabolism in the sellar region, without a definite CT correlate. Consider MR brain without and with contrast in further evaluation, as clinically indicated. 3. New peribronchovascular ground-glass in the superior segment left lower lobe, likely infectious or inflammatory in etiology. 4. Aortic atherosclerosis (ICD10-I70.0). Coronary artery calcification. 5.  Emphysema (ICD10-J43.9). Electronically Signed   By: Leanna Battles M.D.   On: 05/22/2020  11:11   DG ESOPHAGUS W DOUBLE CM (HD)  Result Date: 05/17/2020 CLINICAL DATA:  Odynophagia.  Difficulty swallowing. EXAM: ESOPHOGRAM / BARIUM SWALLOW / BARIUM TABLET STUDY TECHNIQUE: Combined double contrast and single contrast examination performed using effervescent crystals, thick barium liquid, and thin barium liquid. The patient was observed with fluoroscopy swallowing a 13 mm barium sulphate tablet. FLUOROSCOPY TIME:  Fluoroscopy Time:   1 minutes 6 seconds Radiation Exposure Index (if provided by the fluoroscopic device): 3.7 mGy Number of Acquired Spot Images: 0 COMPARISON:  None. FINDINGS: Limited evaluation secondary to patient motion resulting from pain. Normal pharyngeal anatomy and motility. Contrast flowed freely through the esophagus without evidence of a stricture or mass. Mild mucosal thickening and irregularity without ulceration. Esophageal motility was normal. Mild gastroesophageal reflux. No definite hiatal hernia was demonstrated. At the end of the examination a 13 mm barium tablet was administered which transited through the esophagus and esophagogastric junction without delay. IMPRESSION: 1. Mild mucosal thickening and irregularity without ulceration as can be seen with mild esophagitis. 2. Mild gastroesophageal reflux. 3. No esophageal stricture. Electronically Signed   By: Elige Ko   On: 05/17/2020 08:32    PERFORMANCE STATUS (ECOG) : 1 - Symptomatic but completely ambulatory  Review of Systems Unless otherwise noted, a complete review of systems is negative.  Physical Exam General: NAD Cardiovascular: regular rate and rhythm Pulmonary: clear ant fields Abdomen: soft, nontender, + bowel sounds GU: no suprapubic tenderness Extremities: no edema, no joint deformities Skin: no rashes Neurological: Weakness but otherwise nonfocal  IMPRESSION: I met with patient and daughter today in the clinic.  I introduced palliative care services and attempted to establish therapeutic rapport.  Symptomatically, patient describes severe pain when he swallows radiating to his neck.  He denies coughing or choking.  He is mostly consuming a liquid diet due to odynophagia.  He was prescribed oxycodone yesterday by Dr. Merlene Pulling.  Patient states that the oxycodone has helped but he has had to take 2 tablets to find relief with the pain and has found the medication to be short-lived.  Patient is taking his Protonix daily and was  given a prescription for Carafate x7 days, which will be completed tomorrow.  Patient was referred to GI and will see Dr. Servando Snare next week.  Patient endorses severe reflux and esophageal burning.  Presumably much of his pain is secondary to reflux esophagitis.  Patient may warrant endoscopic examination to evaluate for other causes.  We will plan to continue Carafate through next week to give patient a chance for GI eval.  Will defer PPI management to them but could consider short-term twice daily dosing versus adding an H2RA at bedtime.  Discussed lifestyle interventions for reducing GERD.  Patient has reduced his tobacco/EtOH use.  Regarding his opioid regimen, will trial initiation of an LAO with hope that it lessens patient's need for frequent oxycodone IR for breakthrough pain.  We discussed safe storage and administration of pain medications.  Patient denies any adverse effects, drowsiness, or sedation from pain medications.  He is not driving.  Daughter is managing his pain medications and storing them in a locked container.  Patient's current goals are aligned with planned work-up with future discussions of treatment options per Dr. Merlene Pulling.  We discussed CODE STATUS.  Patient says that he would not want to be resuscitated nor have his life prolonged artificially on machines.  He wants to be a DNR/DNI.  Daughter says she agrees with this decision.  Patient  would be in agreement with short-term hospitalization if needed to treat the treatable.  I completed a MOST form today. The patient and family outlined their wishes for the following treatment decisions:  Cardiopulmonary Resuscitation: Do Not Attempt Resuscitation (DNR/No CPR)  Medical Interventions: Limited Additional Interventions: Use medical treatment, IV fluids and cardiac monitoring as indicated, DO NOT USE intubation or mechanical ventilation. May consider use of less invasive airway support such as BiPAP or CPAP. Also provide comfort  measures. Transfer to the hospital if indicated. Avoid intensive care.   Antibiotics: Antibiotics if indicated  IV Fluids: IV fluids if indicated  Feeding Tube: No feeding tube     PLAN: -Continue current scope of treatment -Start OxyContin 10 mg every 12 hours (#30 tablets) -Continue oxycodone 5 to 10 mg every 4 hours as needed for breakthrough pain -Naloxone kit -Prophylactic bowel regimen with as needed Senokot -Referral to RD -Continue oral supplements TID -Continue PPI/Carafate until evaluated by GI -DNR/DNI -MOST form completed -Follow-up MyChart visit in about 2 weeks   Patient expressed understanding and was in agreement with this plan. He also understands that He can call the clinic at any time with any questions, concerns, or complaints.     Time Total: 30 minutes  Visit consisted of counseling and education dealing with the complex and emotionally intense issues of symptom management and palliative care in the setting of serious and potentially life-threatening illness.Greater than 50%  of this time was spent counseling and coordinating care related to the above assessment and plan.  Signed by: Altha Harm, PhD, NP-C

## 2020-05-27 ENCOUNTER — Other Ambulatory Visit: Payer: Self-pay

## 2020-05-27 ENCOUNTER — Ambulatory Visit: Payer: Medicare Other

## 2020-05-27 ENCOUNTER — Ambulatory Visit
Admission: RE | Admit: 2020-05-27 | Discharge: 2020-05-27 | Disposition: A | Discharge status: Home / Self Care | Payer: Medicare Other | Source: Ambulatory Visit | Attending: Hematology and Oncology | Admitting: Hematology and Oncology

## 2020-05-27 ENCOUNTER — Telehealth: Payer: Self-pay | Admitting: *Deleted

## 2020-05-27 DIAGNOSIS — R634 Abnormal weight loss: Secondary | ICD-10-CM | POA: Insufficient documentation

## 2020-05-27 DIAGNOSIS — C7951 Secondary malignant neoplasm of bone: Secondary | ICD-10-CM | POA: Insufficient documentation

## 2020-05-27 DIAGNOSIS — R1310 Dysphagia, unspecified: Secondary | ICD-10-CM | POA: Insufficient documentation

## 2020-05-27 DIAGNOSIS — R918 Other nonspecific abnormal finding of lung field: Secondary | ICD-10-CM | POA: Diagnosis present

## 2020-05-27 DIAGNOSIS — G893 Neoplasm related pain (acute) (chronic): Secondary | ICD-10-CM | POA: Insufficient documentation

## 2020-05-27 DIAGNOSIS — C799 Secondary malignant neoplasm of unspecified site: Secondary | ICD-10-CM | POA: Insufficient documentation

## 2020-05-27 HISTORY — DX: Other nonspecific abnormal finding of lung field: R91.8

## 2020-05-27 HISTORY — DX: Disorder of adrenal gland, unspecified: E27.9

## 2020-05-27 MED ORDER — SODIUM CHLORIDE 0.9 % IV SOLN: INTRAVENOUS | Status: DC

## 2020-05-27 MED ORDER — MIDAZOLAM HCL 5 MG/5ML IJ SOLN
INTRAMUSCULAR | Status: AC | PRN
  Administered 2020-05-27: 1 mg via INTRAVENOUS

## 2020-05-27 MED ORDER — FENTANYL CITRATE (PF) 100 MCG/2ML IJ SOLN
INTRAMUSCULAR | Status: AC | PRN
  Administered 2020-05-27: 50 ug via INTRAVENOUS

## 2020-05-27 MED ORDER — MIDAZOLAM HCL 5 MG/5ML IJ SOLN
INTRAMUSCULAR | Status: AC
  Filled 2020-05-27: qty 5

## 2020-05-27 MED ORDER — HYDROCODONE-ACETAMINOPHEN 5-325 MG PO TABS
1.0000 | ORAL_TABLET | ORAL | Status: DC | PRN
  Filled 2020-05-27: qty 2

## 2020-05-27 MED ORDER — FENTANYL CITRATE (PF) 100 MCG/2ML IJ SOLN
INTRAMUSCULAR | Status: AC
  Filled 2020-05-27: qty 2

## 2020-05-27 NOTE — Consult Note (Signed)
Chief Complaint: Patient was seen in consultation today for CT-guided right sacral lesion biopsy at the request of Douglas [name]  Referring Physician(s): [place] [name]  Patient Status: [facility] - Out-pt  History of Present Illness: Douglas [name] is a 64 y.o. male with a right upper lobe lung lesion and evidence for metastatic disease on PET/CT.  Patient needs a tissue diagnosis.  Patient has a lucent lesion involving the right sacrum that is amendable to CT-guided biopsy.  Patient is a current smoker with a chronic cough.  Patient has not been vaccinated for COVID-19.  Patient denies fevers, chills, new respiratory issues, chest pain, abdominal pain, GI issues, GU issues or neurologic problems.  Patient does have unintended weight loss.  Patient was having dysphagia recently but that has gotten better with medical management.  Past Medical History:  Diagnosis Date  . Hyperlipidemia   . Hypertension   . 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 Lung mass     No past surgical history on file.  Allergies: Patient has no known allergies.  Medications: Prior to Admission medications   Medication Sig Start Date End Date Taking? Authorizing Provider  albuterol (VENTOLIN HFA) 108 (90 Base) MCG/ACT inhaler Inhale 2 puffs into the lungs every 6 (six) hours as needed for wheezing or shortness of breath. 05/17/20  Yes [name], MD  gabapentin (NEURONTIN) 300 MG capsule Take 300 mg by mouth 2 (two) times daily. 05/02/20  Yes [provider]  losartan-hydrochlorothiazide (HYZAAR) 50-12.5 MG tablet Take 1 tablet by mouth daily. 04/30/20  Yes [provider]  mometasone-formoterol (DULERA) 200-5 MCG/ACT AERO Inhale 2 puffs into the lungs 2 (two) times daily. 05/17/20  Yes [name], MD  oxyCODONE (OXY IR/ROXICODONE) 5 MG immediate release tablet Take 1 tablet (5 mg total) by mouth every 4 (four) hours as needed for severe pain. 05/23/20  Yes [name], MD    oxyCODONE (OXYCONTIN) 10 mg 12 hr tablet Take 1 tablet (10 mg total) by mouth every 12 (twelve) hours. 05/24/20  Yes Borders, [name], NP  pantoprazole (PROTONIX) 40 MG tablet Take 1 tablet (40 mg total) by mouth daily. 05/17/20 08/15/20 Yes [name], MD  rosuvastatin (CRESTOR) 10 MG tablet Take 10 mg by mouth daily. 03/13/20  Yes [provider]  sucralfate (CARAFATE) 1 g tablet Take 1 tablet (1 g total) by mouth 4 (four) times daily -  with meals and at bedtime for 7 days. 05/24/20 05/31/20 Yes Borders, [name], NP  tamsulosin (FLOMAX) 0.4 MG CAPS capsule Take 0.4 mg by mouth every evening. 04/01/20  Yes [provider]  albuterol (VENTOLIN HFA) 108 (90 Base) MCG/ACT inhaler Inhale 1-2 puffs into the lungs every 6 (six) hours as needed for wheezing or shortness of breath. Patient not taking: Reported on 05/27/2020 05/16/20   [name], MD  naloxone [facility]) nasal spray 4 mg/0.1 mL 1 spray into nostril upon signs of opioid overdose. Call 911. May repeat once if no response within 2-3 minutes. 05/24/20   Borders, [name], NP     Family History  Problem Relation Age of Onset  . Cancer Neg Hx     Social History   Socioeconomic History  . Marital status: Widowed    Spouse name: Not on file  . Number of children: Not on file  . Years of education: Not on file  . Highest education level: Not on file  Occupational History  . Not on file  Tobacco Use  . Smoking status: Current Every Day Smoker    Packs/day: 0.50    Types: Cigarettes  . Smokeless tobacco: Never Used  Vaping Use  . Vaping Use: Never used  Substance and Sexual Activity  . Alcohol use: Yes    Comment: 2 beers per day  . Drug use: Not on file  . Sexual activity: Not on file  Other Topics Concern  . Not on file  Social History Narrative  . Not on file   Social Determinants of Health   Financial Resource Strain:   . Difficulty of Paying Living Expenses:   Food Insecurity:   . Worried About  Charity fundraiser in the Last Year:   . Arboriculturist in the Last Year:   Transportation Needs:   . Film/video editor (Medical):   Marland Kitchen Lack of Transportation (Non-Medical):   Physical Activity:   . Days of Exercise per Week:   . Minutes of Exercise per Session:   Stress:   . Feeling of Stress :   Social Connections:   . Frequency of Communication with Friends and Family:   . Frequency of Social Gatherings with Friends and Family:   . Attends Religious Services:   . Active Member of Clubs or Organizations:   . Attends Archivist Meetings:   Marland Kitchen Marital Status:      Review of Systems: A 12 point ROS discussed and pertinent positives are indicated in the HPI above.  All other systems are negative.  Review of Systems  Constitutional: Positive for unexpected weight change. Negative for chills and fever.  HENT:       Dysphagia.  Respiratory: Positive for cough.   Cardiovascular: Negative for chest pain.  Gastrointestinal: Negative.   Genitourinary: Negative.   Neurological: Negative.     Physical Exam Constitutional:      General: He is not in acute distress. Cardiovascular:     Rate and Rhythm: Normal rate and regular rhythm.  Pulmonary:     Effort: Pulmonary effort is normal.     Breath sounds: Wheezing present.  Abdominal:     General: Abdomen is flat. Bowel sounds are normal.     Palpations: Abdomen is soft.  Neurological:     Mental Status: He is alert.     Imaging: DG Chest 2 View  Result Date: 05/16/2020 CLINICAL DATA:  Dysphagia, chest pain EXAM: CHEST - 2 VIEW COMPARISON:  05/02/2020 small FINDINGS: The heart size and mediastinal contours are within normal limits. Atherosclerotic calcification of the aortic knob. Mildly hyperinflated lungs. Streaky interstitial opacity within the right upper lobe. No pleural effusion or pneumothorax. The visualized skeletal structures are unremarkable. IMPRESSION: 1. Streaky interstitial opacity within the right  upper lobe suspicious for developing infection. 2. Mildly hyperinflated lungs, suggesting underlying COPD. Electronically Signed   By: [name] D.O.   On: 05/16/2020 08:25   CT Angio Chest PE W and/or Wo Contrast  Result Date: 05/16/2020 CLINICAL DATA:  Concern for pill stuck in throat for 2 days, now with pain with swallowing EXAM: CT ANGIOGRAPHY CHEST WITH CONTRAST TECHNIQUE: Multidetector CT imaging of the chest was performed using the standard protocol during bolus administration of intravenous contrast. Multiplanar CT image reconstructions and MIPs were obtained to evaluate the vascular anatomy. CONTRAST:  74mL OMNIPAQUE IOHEXOL 350 MG/ML SOLN COMPARISON:  Radiograph 05/16/2020 FINDINGS: Cardiovascular: Satisfactory opacification the pulmonary arteries to the segmental level. No pulmonary artery filling defects are identified. Central pulmonary arteries are  normal caliber. Atherosclerotic plaque within the normal caliber aorta. No acute luminal abnormality nor periaortic stranding or hemorrhage. Normal 3 vessel branching of the aortic arch. Atheromatous plaque in the otherwise normal proximal great vessels. Normal heart size. No pericardial effusion. Coronary artery atherosclerosis is present. Mediastinum/Nodes: No mediastinal fluid or gas. Normal thyroid gland and thoracic inlet. No acute abnormality of the trachea. Diffuse circumferential thickening of the thoracic esophagus without para esophageal fluid, gas or adenopathy. No worrisome mediastinal, hilar or axillary adenopathy. Lungs/Pleura: Corresponding to the streaky opacity present in the right upper lobe on comparison chest radiograph spiculated nodule in the right lung apex measuring 2.1 x 1.7 cm in size highly conspicuous for a bronchogenic carcinoma. Additional subpleural 3 mm nodule in the posterior segment right upper lobe (5/34). 2 mm nodule along the left major fissure in the left upper lobe (5/44). No other conspicuous nodules or  masses. Background of centrilobular and paraseptal emphysema in the lungs. No consolidation, features of edema, pneumothorax, or effusion. Upper Abdomen: 2.7 x 1.5 cm heterogeneously enhancing which appears to arise from the adrenal gland concerning for potential metastatic disease. No other acute upper abdominal abnormality. Musculoskeletal: The osseous structures appear diffusely demineralized which may limit detection of small or nondisplaced fractures. No acute osseous abnormality or suspicious osseous lesion. Review of the MIP images confirms the above findings. IMPRESSION: 1. No evidence of pulmonary embolism. 2. Corresponding to the streaky opacity present in the right upper lobe on comparison chest radiograph, spiculated nodule in the right lung apex measuring 2.1 x 1.7 cm in size highly conspicuous for a bronchogenic carcinoma. Consider referral to multi disciplinary case conference for further management decisions. 3. 2.7 x 1.5 cm heterogeneously enhancing which appears to arise from the right adrenal gland concerning for potential metastatic disease. 4. Diffuse circumferential thickening of the thoracic esophagus, likely to correlate with patient's symptoms of esophagitis. 5. Aortic Atherosclerosis (ICD10-I70.0). 6. 7. These results were called by telephone at the time of interpretation on 05/16/2020 at 3:10 pm to provider [name] , who verbally acknowledged these results. Electronically Signed   By: Lovena Le M.D.   On: 05/16/2020 15:10   NM PET Image Initial (PI) Skull Base To Thigh  Result Date: 05/22/2020 CLINICAL DATA:  Initial treatment strategy for lung nodule, adrenal mass. EXAM: NUCLEAR MEDICINE PET SKULL BASE TO THIGH TECHNIQUE: 7.8 mCi F-18 FDG was injected intravenously. Full-ring PET imaging was performed from the skull base to thigh after the radiotracer. CT data was obtained and used for attenuation correction and anatomic localization. Fasting blood glucose: 94 mg/dl  COMPARISON:  CT chest 05/16/2020. FINDINGS: Mediastinal blood pool activity: SUV max 2.0 Liver activity: SUV max NA NECK: There is focal hypermetabolism in the sellar region, SUV max 8.4, without a definite CT correlate. No hypermetabolic lymph nodes. Incidental CT findings: None. CHEST: Spiculated nodule in the apical segment right upper lobe measures 1.9 x 2.2 cm with an SUV max of 10.9. There is abnormal hypermetabolism associated with apical segmental bronchial wall thickening and mucoid impaction (3/79, 84 and 87), SUV max 3.8. Hypermetabolic right hilar lymph node measures 7 mm (3/95) with an SUV max of 4.0. Right paratracheal lymph node measures 5 mm (3/81) with an SUV max of 3.1. AP window lymph node measures 6 mm (3/81) with an SUV max 4.9. Focal left hilar metabolism has an SUV max of 3.3 and corresponds to a 5 mm lymph node, better seen on contrast infused CT chest 05/16/2020. New peribronchovascular ground-glass in  the superior segment left lower lobe (3/97) has an SUV max 2.8. Mild mid esophageal long segment hypermetabolism, without a definite CT correlate. Incidental CT findings: Atherosclerotic calcification of the aorta and coronary arteries. Heart size normal. No pericardial or pleural effusion. Centrilobular and paraseptal emphysema. ABDOMEN/PELVIS: Mildly hyperattenuating right adrenal mass measures 1.5 x 2.5 cm with an SUV max 28.6. There is focal hypermetabolism in the left adrenal gland, SUV max 5.6, without a definite CT correlate. No abnormal hypermetabolism in the liver, spleen, pancreas or lymph nodes. Incidental CT findings: Liver, gallbladder, kidneys, spleen, pancreas, stomach and bowel are otherwise unremarkable. Atherosclerotic calcification of the aorta without aneurysm. Slight bladder wall thickening. Prostate is normal in size. SKELETON: There are multiple hypermetabolic lytic lesions in the cervical spine, right scapula, posterior left tenth rib, L3, right sacrum and right pubic  bone. Index lesion in the right sacral ala measures 2.9 x 3.4 cm (3/194) with an SUV max of 41.3. Incidental CT findings: None. IMPRESSION: 1. 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 2. Focal hypermetabolism in the sellar region, without a definite CT correlate. Consider MR brain without and with contrast in further evaluation, as clinically indicated. 3. New peribronchovascular ground-glass in the superior segment left lower lobe, likely infectious or inflammatory in etiology. 4. Aortic atherosclerosis (ICD10-I70.0). Coronary artery calcification. 5.  Emphysema (ICD10-J43.9). Electronically Signed   By: Lorin Picket M.D.   On: 05/22/2020 11:11   DG ESOPHAGUS W DOUBLE CM (HD)  Result Date: 05/17/2020 CLINICAL DATA:  Odynophagia.  Difficulty swallowing. EXAM: ESOPHOGRAM / BARIUM SWALLOW / BARIUM TABLET STUDY TECHNIQUE: Combined double contrast and single contrast examination performed using effervescent crystals, thick barium liquid, and thin barium liquid. The patient was observed with fluoroscopy swallowing a 13 mm barium sulphate tablet. FLUOROSCOPY TIME:  Fluoroscopy Time:  1 minutes 6 seconds Radiation Exposure Index (if provided by the fluoroscopic device): 3.7 mGy Number of Acquired Spot Images: 0 COMPARISON:  None. FINDINGS: Limited evaluation secondary to patient motion resulting from pain. Normal pharyngeal anatomy and motility. Contrast flowed freely through the esophagus without evidence of a stricture or mass. Mild mucosal thickening and irregularity without ulceration. Esophageal motility was normal. Mild gastroesophageal reflux. No definite hiatal hernia was demonstrated. At the end of the examination a 13 mm barium tablet was administered which transited through the esophagus and esophagogastric  junction without delay. IMPRESSION: 1. Mild mucosal thickening and irregularity without ulceration as can be seen with mild esophagitis. 2. Mild gastroesophageal reflux. 3. No esophageal stricture. Electronically Signed   By: Kathreen Devoid   On: 05/17/2020 08:32    Labs:  CBC: Recent Labs    05/16/20 0805 05/17/20 0511  WBC 16.3* 11.0*  HGB 13.1 11.4*  HCT 41.7 35.4*  PLT 376 275    COAGS: No results for input(s): INR, APTT in the last 8760 hours.  BMP: Recent Labs    05/16/20 0805 05/17/20 0511  NA 139 143  K 4.0 4.9  CL 100 103  CO2 28 31  GLUCOSE 133* 110*  BUN 9 10  CALCIUM 9.8 8.7*  CREATININE 1.01 1.15  GFRNONAA >60 >60  GFRAA >60 >60    LIVER FUNCTION TESTS: Recent Labs    05/16/20 1354  BILITOT 0.6  AST 21  ALT 16  ALKPHOS 103  PROT 7.4  ALBUMIN 3.4*    TUMOR MARKERS: No  results for input(s): AFPTM, CEA, CA199, CHROMGRNA in the last 8760 hours.  Assessment and Plan:  64 year old smoker with probable metastatic lung cancer based on imaging.  Patient needs a tissue diagnosis.  A lucent lesion involving the right sacrum is amendable to CT-guided core biopsy.  Plan for CT-guided core biopsy with moderate sedation.  Risks and benefits of right sacral lesion biopsy was discussed with the patient and/or patient's family including, but not limited to bleeding, infection, damage to adjacent structures or low yield requiring additional tests.  All of the questions were answered and there is agreement to proceed.  Consent signed and in chart.    Thank you for this interesting consult.  I greatly enjoyed meeting ABU HEAVIN and look forward to participating in their care.  A copy of this report was sent to the requesting provider on this date.  Electronically Signed: Burman Riis, MD 05/27/2020, 11:31 AM   I spent a total of  15 Minutes   in face to face in clinical consultation, greater than 50% of which was counseling/coordinating care for  CT-guided biopsy.

## 2020-05-27 NOTE — Telephone Encounter (Signed)
Daughter called asking for prescription for patient to take for MRI to relax him as he will be there for 2 hours. Please advise

## 2020-05-27 NOTE — Discharge Instructions (Signed)

## 2020-05-27 NOTE — Procedures (Signed)
Interventional Radiology Procedure:   Indications: Probable metastatic lung cancer with right sacral lesion  Procedure: CT guided right sacral lesion biopsy  Findings: Lucent lesion in right sacral ala.  Core biopsies obtained with 18 gauge device.   Complications: None     EBL: less than 10 ml  Plan: Bedrest 2 hours then discharge to home.    Christo Hain R. Anselm Pancoast, MD  Pager: [phone]

## 2020-05-28 ENCOUNTER — Ambulatory Visit (INDEPENDENT_AMBULATORY_CARE_PROVIDER_SITE_OTHER): Payer: Medicare Other | Admitting: Pulmonary Disease

## 2020-05-28 ENCOUNTER — Encounter: Payer: Self-pay | Admitting: Pulmonary Disease

## 2020-05-28 ENCOUNTER — Other Ambulatory Visit: Payer: Self-pay | Admitting: Hematology and Oncology

## 2020-05-28 ENCOUNTER — Inpatient Hospital Stay: Payer: Medicare Other | Admitting: Hematology and Oncology

## 2020-05-28 VITALS — BP 130/70 | HR 92 | Temp 97.8°F | Ht 65.0 in | Wt 138.8 lb

## 2020-05-28 DIAGNOSIS — J440 Chronic obstructive pulmonary disease with acute lower respiratory infection: Secondary | ICD-10-CM | POA: Diagnosis not present

## 2020-05-28 DIAGNOSIS — F419 Anxiety disorder, unspecified: Secondary | ICD-10-CM

## 2020-05-28 DIAGNOSIS — J209 Acute bronchitis, unspecified: Secondary | ICD-10-CM

## 2020-05-28 DIAGNOSIS — R0989 Other specified symptoms and signs involving the circulatory and respiratory systems: Secondary | ICD-10-CM

## 2020-05-28 DIAGNOSIS — C3490 Malignant neoplasm of unspecified part of unspecified bronchus or lung: Secondary | ICD-10-CM | POA: Diagnosis not present

## 2020-05-28 DIAGNOSIS — J9601 Acute respiratory failure with hypoxia: Secondary | ICD-10-CM | POA: Diagnosis not present

## 2020-05-28 DIAGNOSIS — C7951 Secondary malignant neoplasm of bone: Secondary | ICD-10-CM

## 2020-05-28 DIAGNOSIS — J418 Mixed simple and mucopurulent chronic bronchitis: Secondary | ICD-10-CM

## 2020-05-28 DIAGNOSIS — J449 Chronic obstructive pulmonary disease, unspecified: Secondary | ICD-10-CM

## 2020-05-28 DIAGNOSIS — F17210 Nicotine dependence, cigarettes, uncomplicated: Secondary | ICD-10-CM

## 2020-05-28 MED ORDER — LORAZEPAM 0.5 MG PO TABS: 0.5000 mg | ORAL_TABLET | Freq: Once | ORAL | 0 refills | Status: DC | PRN

## 2020-05-28 MED ORDER — AZITHROMYCIN 250 MG PO TABS: ORAL_TABLET | ORAL | 0 refills | Status: AC

## 2020-05-28 MED ORDER — PREDNISONE 10 MG (21) PO TBPK
ORAL_TABLET | ORAL | 0 refills | Status: DC
Start: 2020-05-28 — End: 2020-06-12

## 2020-05-28 MED ORDER — BREZTRI AEROSPHERE 160-9-4.8 MCG/ACT IN AERO: 2.0000 | INHALATION_SPRAY | Freq: Two times a day (BID) | RESPIRATORY_TRACT | 0 refills | Status: AC

## 2020-05-28 NOTE — Progress Notes (Signed)
Subjective:    Patient ID: Douglas [name], male    DOB: 11-09-1955, 64 y.o.   MRN: [id number]  HPI Patient is a 64 year old current smoker (1 PPD) presents for evaluation and management of suspected COPD. There is also issue of a right upper lobe mass. Is referred by the hospitalist group at [facility]. Patient was recently admitted to [facility] on 16 May 2020 for evaluation of new onset of dysphagia and odynophagia starting 2 days prior to admission. Patient was evaluated and a CT angio scan of the chest was performed noting a spiculated lesion in the right lung apex measuring 2.1 x 1.7 cm, evidence of potential right adrenal metastasis and diffuse circumferential thickening of the thoracic esophagus consistent with esophagitis. Also evidence of advanced emphysema on CT. She also underwent barium swallow which showed findings consistent with esophagitis and gastroesophageal reflux. He subsequently underwent PET/CT on 11 August which showed hypermetabolic right upper lobe mass and diffuse bony metastases. The patient underwent CT-guided core biopsy of the right sacral bone lesion yesterday results are pending.  Patient states that he has been having significant dyspnea for 1 to 2 years but however this has become worse over the last 6 months. Intentional weight loss but cannot establish how much. He was noted to have esophagitis and treated with Protonix. His dysphagia is better on these medications. He was also discharged on [facility] but states that inhalers really did not help him. Does not describe any orthopnea or paroxysmal nocturnal dyspnea. No lower extremity edema. No chest pain. He has been having right hip/sacral pain.    Review of Systems A 10 point review of systems was performed and it is as noted above otherwise negative.  Past Medical History:  Diagnosis Date  . COPD (chronic obstructive pulmonary disease) ([place])   . Hyperlipidemia   . Hypertension   . Lesion of adrenal gland ([place])   .  Lung mass    No past surgical history on file.  Family History  Problem Relation Age of Onset  . Cancer Neg Hx    Social History   Tobacco Use  . Smoking status: Current Every Day Smoker    Packs/day: 0.50    Years: 45.00    Pack years: 22.50    Types: Cigarettes    Start date: 05/29/1975  . Smokeless tobacco: Never Used  . Tobacco comment: 1 pack/week  Substance Use Topics  . Alcohol use: Yes    Comment: 2 beers per day   Reports marijuana use.  No Known Allergies  Current Meds  Medication Sig  . albuterol (VENTOLIN HFA) 108 (90 Base) MCG/ACT inhaler Inhale 1-2 puffs into the lungs every 6 (six) hours as needed for wheezing or shortness of breath.  Marland Kitchen albuterol (VENTOLIN HFA) 108 (90 Base) MCG/ACT inhaler Inhale 2 puffs into the lungs every 6 (six) hours as needed for wheezing or shortness of breath.  . gabapentin (NEURONTIN) 300 MG capsule Take 300 mg by mouth 2 (two) times daily.  Marland Kitchen losartan-hydrochlorothiazide (HYZAAR) 50-12.5 MG tablet Take 1 tablet by mouth daily.  . mometasone-formoterol (DULERA) 200-5 MCG/ACT AERO Inhale 2 puffs into the lungs 2 (two) times daily.  . naloxone (NARCAN) nasal spray 4 mg/0.1 mL 1 spray into nostril upon signs of opioid overdose. Call 911. May repeat once if no response within 2-3 minutes.  Marland Kitchen oxyCODONE (OXY IR/ROXICODONE) 5 MG immediate release tablet Take 1 tablet (5 mg total) by mouth every 4 (four) hours as needed for severe pain.  Marland Kitchen  oxyCODONE (OXYCONTIN) 10 mg 12 hr tablet Take 1 tablet (10 mg total) by mouth every 12 (twelve) hours.  . pantoprazole (PROTONIX) 40 MG tablet Take 1 tablet (40 mg total) by mouth daily.  . sucralfate (CARAFATE) 1 g tablet Take 1 tablet (1 g total) by mouth 4 (four) times daily -  with meals and at bedtime for 7 days.    There is no immunization history on file for this patient.  We discussed Covid-19 precautions.  I reviewed the vaccine effectiveness and potential side effects in detail to include  differences between mRNA vaccines and traditional vaccines (attenuated virus).  Discussion also offered of long-term effectiveness and safety profile which are unclear at this time.  Discussed current CDC guidance that all patients are recommended COVID-19 vaccinations [when available]-as long as they do not have allergy to components of the vaccine.    Objective:   Physical Exam BP 130/70 (BP Location: Left Arm, Cuff Size: Normal)   Pulse 92   Temp 97.8 F (36.6 C) (Oral)   Ht 5\' 5"  (1.651 m)   Wt 138 lb 12.8 oz (63 kg)   SpO2 92%   BMI 23.10 kg/m    GENERAL: Thin somewhat disheveled gentleman, no acute distress. Uses accessories of respiration. HEAD: Normocephalic, atraumatic.  EYES: Pupils equal, round, reactive to light.  No scleral icterus.  MOUTH: Nose/mouth/throat not examined due to masking requirements for COVID 19. NECK: Supple. No thyromegaly. Trachea midline. No JVD.  No adenopathy. PULMONARY: Distant breath sounds bilaterally. Coarse, rhonchi throughout, no wheezes. CARDIOVASCULAR: S1 and S2. Regular rate and rhythm. No rubs, murmurs or gallops heard. ABDOMEN: Benign. MUSCULOSKELETAL: No joint deformity, no clubbing, no edema.  NEUROLOGIC: No focal deficits, awake alert, speech is fluent. SKIN: Intact,warm,dry. Limited exam: No rashes  PSYCH: Mood and behavior normal.  Ambulatory oximetry: On room air patient readily desaturated to 84%. He was then placed on 2 L/min and was able to maintain oxygen saturations between 92 to 94% with ambulation. Patient noted marked improvement on his dyspnea.  Chest CT performed August 2021 as follows:       Assessment & Plan:     ICD-10-CM   1. COPD suggested by initial evaluation [facility])  J44.9 Ambulatory Referral for DME   Initiate Breztri 2 puffs twice a day Dulera has been ineffective Patient was provided spacer for delivery of the medication  2. Acute bronchitis with COPD ([place])  J44.0    J20.9    Azithromycin Z-Pak and  prednisone taper  3. Acute respiratory failure with hypoxia ([facility])  J96.01    Oxygen at 2 L/min  4. Lung cancer metastatic to bone [facility])  C34.90    C79.51    Work-up in progress Sacral lesion biopsy 8/16 Consider bronchoscopy if biopsy of the sacral lesion negative  5. Tobacco dependence due to cigarettes  F17.210    Patient counseled regards to discontinuation of smoking Total counseling time 3 to 5 minutes   Orders Placed This Encounter  Procedures  . Ambulatory Referral for DME    Referral Priority:   Routine    Referral Type:   Durable Medical Equipment Purchase    Number of Visits Requested:   1   Meds ordered this encounter  Medications  . predniSONE (STERAPRED UNI-PAK 21 TAB) 10 MG (21) TBPK tablet    Sig: Take as directed in package.  This is a taper pack.    Dispense:  1 each    Refill:  0  . azithromycin (  ZITHROMAX Z-PAK) 250 MG tablet    Sig: Take 2 tablets (500 mg) on  Day 1,  followed by 1 tablet (250 mg) once daily on Days 2 through 5.    Dispense:  6 each    Refill:  0  . Budeson-Glycopyrrol-Formoterol (BREZTRI AEROSPHERE) 160-9-4.8 MCG/ACT AERO    Sig: Inhale 2 puffs into the lungs in the morning and at bedtime.    Dispense:  2 g    Refill:  0    Order Specific Question:   Lot Number?    Answer:   [id number]    Order Specific Question:   Expiration Date?    Answer:   10/10/2021    Order Specific Question:   Manufacturer?    Answer:   [employer] [[id number]]    Order Specific Question:   Quantity    Answer:   2   Discussion:  Patient has been having issues with dyspnea on exertion for approximately 1 to 2 years worse over the last 6 months. Evaluation today shows significant oxygen desaturations with ambulation. He appears to be in a fairly compensated state though this can be optimized somewhat. For his desaturations were significant to 84% and he will need oxygen supplementation.  Other issues as noted above. He is currently being evaluated for lung cancer with  bony metastasis. Will consider bronchoscopy if sacral bone biopsy is negative.  The proper use of metered-dose inhaler as well as the use of the spacer device. Provided spacer device today.  We'll see the patient in follow-up in 2 to 3 months time he is to contact us prior to that time should any new difficulties arise.   Renold Don, MD [facility] [place] [facility]   *This note was dictated using voice recognition software/Dragon.  Despite best efforts to proofread, errors can occur which can change the meaning.  Any change was purely unintentional.

## 2020-05-28 NOTE — Patient Instructions (Signed)
Your oxygen level dropped to 84% when you walk today.  You will need oxygen at 2 L/min.  This maintain your oxygen level doing well.  We are giving you a trial of Breztri 2 puffs twice a day you will use a spacer to get the medication into your lungs.  Make sure you rinse your mouth well after you use it.   We will await the results of the biopsy that was done yesterday.  I will discuss your case with Dr. Mike Gip.   I am sending a prescription of prednisone and an antibiotic your pharmacy.

## 2020-05-28 NOTE — Telephone Encounter (Signed)
  Was this taken care of?  M

## 2020-05-29 ENCOUNTER — Other Ambulatory Visit: Payer: Self-pay

## 2020-05-29 ENCOUNTER — Telehealth: Payer: Self-pay | Admitting: *Deleted

## 2020-05-29 ENCOUNTER — Ambulatory Visit
Admission: RE | Admit: 2020-05-29 | Discharge: 2020-05-29 | Disposition: A | Discharge status: Home / Self Care | Payer: Medicare Other | Source: Ambulatory Visit | Attending: Hematology and Oncology | Admitting: Hematology and Oncology

## 2020-05-29 ENCOUNTER — Encounter: Payer: Self-pay | Admitting: Pulmonary Disease

## 2020-05-29 DIAGNOSIS — R918 Other nonspecific abnormal finding of lung field: Secondary | ICD-10-CM | POA: Insufficient documentation

## 2020-05-29 MED ORDER — GADOBUTROL 1 MMOL/ML IV SOLN
6.0000 mL | Freq: Once | INTRAVENOUS | Status: AC | PRN
  Administered 2020-05-29: 6 mL via INTRAVENOUS

## 2020-05-29 NOTE — Progress Notes (Signed)
[facility]  [address] [place], [place] [address] Phone: [phone]  Fax: [phone]   Clinic Day:  05/30/2020  Referring physician: [name], MD  Chief Complaint: Douglas [name] is a 64 y.o. male with a right upper lobe lung mass who is seen for review of work up and discussion regarding direction of therapy.   HPI: The patient was last seen in the medical oncology clinic on 05/23/2020. At that time, he had diffuse bone pain. He was eating poorly and losing weight.   PET scan revealed a 2.2 cm RUL hypermetabolic lesion with associated bilateral mediastinal nodes, bilateral adrenal lesions and multiple lytic bone lesions.   Right sacral ala biopsy was ordered.  Additional imaging studies were requested. Oxycodone was prescribed for pain.  He was referred to radiation oncology.  He was referred to GI for dysphagia.  Nutrition was consulted.  The patient had an initial consultation with Douglas Harm, NP on 05/24/2020. Patient had been severely symptomatic with pain, weight loss, and poor appetite.  Patient described severe pain when he swallowed  radiating to his neck. He denied coughing or choking. He was mostly consuming a liquid diet due to odynophagia. Patient was started on OxyContin 10 mg q 12 hours and oxycodone 5 to 10 mg q 4 hours prn.   He underwent CT guided biopsy of the right sacral ala on 05/27/2020. Pathology revealed adenocarcinoma of unknown primary.  Additional IHC testing is pending.   He saw Dr. Patsey Berthold on 05/28/2020. He was advised to initiate Breztri 2 puffs BID. He was placed on a Z-pack and prednisone taper.   Head MRI on 05/29/2020 revealed sub-5 mm bilateral cerebellar enhancing foci are concerning for metastases. There was sub-5 mm right parietal bone and basilar occipital enhancing foci, concerning for osseous metastases. There was sequela of remote left thalamic and right midbrain insults. There was mild cerebral  atrophy and chronic microvascular ischemic changes  Cervical spine MRI on 05/29/2020 revealed metastatic soft tissue involving the left C5-6 facet joint and right anterolateral C5 vertebral body. There was extension of tumor into the prevertebral and circumferential epidural space at the C5-6 level without significant spinal canal narrowing. Tumor also extends to involve the left C5-6 and C6-7 transverse and neural foramen with partial encasement of the vertebral artery.  There was no evidence of spinal cord enhancement.  Lumbar spine MRI on 05/29/2020 revealed L3 metastatic lesion and pathologic fracture with approximately 20% height loss. There was extension of tumor into the prevertebral and anterior epidural space at the L3 level with mild spinal canal and bilateral neural foraminal narrowing.  There was partially imaged 4.2 cm right lumbosacral junction/SI joint metastasis with extension of tumor into the right L4-5 neural foramen. There was possible associated pathologic fracture.  Sacrum SI joint MRI on 05/29/2020 revealed a 4.4 x 2.5 x 4.7 cm destructive enhancing mass in the right sacrum and a 3.1 x 1.0 x 4.8 cm posterior right ilium mass with impingement on the right L5 root in the periphery of the foramen and beyond the foramen.  During the interim, he felt much better.  He reports upper back and neck pain are better with pain medication.  Whole body pain is 7 out of 10 today.  We discussed Delton See; he is edentulous and had no dental issues.  He is eating better.  Dysphagia is slightly improved. He is taking small bites and chewing his food well. He is drinking Ensure and Boost daily.  He is taking his pain medication as prescribed and notes improvement. He is taking short acting oxycodone about 4 times per day.   His daughter reports he will see Dr. Baruch [name] next week.    Past Medical History:  Diagnosis Date  . COPD (chronic obstructive pulmonary disease) ([place])   . Hyperlipidemia   .  Hypertension   . Lesion of adrenal gland ([place])   . Lung mass     History reviewed. No pertinent surgical history.  Family History  Problem Relation Age of Onset  . Cancer Neg Hx     Social History:  reports that he has been smoking cigarettes. He started smoking about 45 years ago. He has a 22.50 pack-year smoking history. He has never used smokeless tobacco. He reports current alcohol use.  Drug: Marijuana. He smoked 1 pack a day for 50 years.  He stopped smoking 2 weeks ago.  The patient denies any exposure to radiation or toxins.  He is retired.  He used to cut down trees.  The patient lives in [place]. He has son ("Douglas [name]") and a daughter Douglas [name]). The patient is accompanied by Douglas [name] in person and his daughter, Douglas [name], via iPad.  Allergies: No Known Allergies  Current Medications: Current Outpatient Medications  Medication Sig Dispense Refill  . albuterol (VENTOLIN HFA) 108 (90 Base) MCG/ACT inhaler Inhale 1-2 puffs into the lungs every 6 (six) hours as needed for wheezing or shortness of breath. 6.7 g 0  . albuterol (VENTOLIN HFA) 108 (90 Base) MCG/ACT inhaler Inhale 2 puffs into the lungs every 6 (six) hours as needed for wheezing or shortness of breath. 18 g 1  . Budeson-Glycopyrrol-Formoterol (BREZTRI AEROSPHERE) 160-9-4.8 MCG/ACT AERO Inhale 2 puffs into the lungs in the morning and at bedtime. 2 g 0  . gabapentin (NEURONTIN) 300 MG capsule Take 300 mg by mouth 2 (two) times daily.    Marland Kitchen losartan-hydrochlorothiazide (HYZAAR) 50-12.5 MG tablet Take 1 tablet by mouth daily.    . mometasone-formoterol (DULERA) 200-5 MCG/ACT AERO Inhale 2 puffs into the lungs 2 (two) times daily. 8.8 g 1  . oxyCODONE (OXY IR/ROXICODONE) 5 MG immediate release tablet Take 1 tablet (5 mg total) by mouth every 4 (four) hours as needed for severe pain. 42 tablet 0  . oxyCODONE (OXYCONTIN) 10 mg 12 hr tablet Take 1 tablet (10 mg total) by mouth every 12 (twelve) hours. 30 tablet 0  . pantoprazole (PROTONIX) 40 MG  tablet Take 1 tablet (40 mg total) by mouth daily. 90 tablet 0  . predniSONE (STERAPRED UNI-PAK 21 TAB) 10 MG (21) TBPK tablet Take as directed in package.  This is a taper pack. 1 each 0  . sucralfate (CARAFATE) 1 g tablet Take 1 tablet (1 g total) by mouth 4 (four) times daily -  with meals and at bedtime for 7 days. 28 tablet 0  . azithromycin (ZITHROMAX Z-PAK) 250 MG tablet Take 2 tablets (500 mg) on  Day 1,  followed by 1 tablet (250 mg) once daily on Days 2 through 5. 6 each 0  . LORazepam (ATIVAN) 0.5 MG tablet Take 1 tablet (0.5 mg total) by mouth once as needed for up to 1 dose for anxiety (prior to MRI). (Patient not taking: Reported on 05/30/2020) 1 tablet 0  . naloxone (NARCAN) nasal spray 4 mg/0.1 mL 1 spray into nostril upon signs of opioid overdose. Call 911. May repeat once if no response within 2-3 minutes. (Patient not taking: Reported on 05/30/2020) 1 each 0  .  rosuvastatin (CRESTOR) 10 MG tablet Take 10 mg by mouth daily. (Patient not taking: Reported on 05/28/2020)    . tamsulosin (FLOMAX) 0.4 MG CAPS capsule Take 0.4 mg by mouth every evening. (Patient not taking: Reported on 05/28/2020)     No current facility-administered medications for this visit.    Review of Systems  Constitutional: Negative for chills, diaphoresis, fever, malaise/fatigue and weight loss (stable).       Feels much better.  HENT: Negative for congestion, ear discharge, ear pain, hearing loss, nosebleeds, sinus pain, sore throat and tinnitus.        Edentulous.  Eyes: Negative for blurred vision.  Respiratory: Negative for cough, hemoptysis, sputum production and shortness of breath.        COPD. Using nebulizer and albuterol.  Cardiovascular: Negative for chest pain, palpitations, orthopnea, claudication and leg swelling.  Gastrointestinal: Negative for abdominal pain, blood in stool, constipation, diarrhea, heartburn, melena, nausea and vomiting.       Eating better. Drinking Ensure and Boost. Dysphagia,  improved.  Genitourinary: Negative for dysuria, frequency, hematuria and urgency.  Musculoskeletal: Positive for back pain (lumbar spine, improving), joint pain (ankles), myalgias and neck pain (improving).       Whole body pain (7/10).  Skin: Negative for itching and rash.  Neurological: Positive for weakness (generalized). Negative for dizziness, tingling, sensory change and headaches.       Balance and coordination off.  Endo/Heme/Allergies: Bruises/bleeds easily (forearm bruising).  Psychiatric/Behavioral: Negative for depression and memory loss. The patient is not nervous/anxious and does not have insomnia.   All other systems reviewed and are negative.  Performance status (ECOG):  1-2  Vitals Blood pressure 137/78, pulse 71, temperature 97.7 F (36.5 C), temperature source Tympanic, resp. rate 18, height 5\' 5"  (1.651 m), weight 139 lb 8.8 oz (63.3 kg), SpO2 97 %.   Physical Exam Vitals and nursing note reviewed.  Constitutional:      General: He is not in acute distress.    Appearance: Normal appearance.     Interventions: Face mask in place.  HENT:     Head: Normocephalic and atraumatic.     Comments: Dark shoulder length hair.    Mouth/Throat:     Mouth: Mucous membranes are moist.     Pharynx: Oropharynx is clear.  Eyes:     General: No scleral icterus.    Extraocular Movements: Extraocular movements intact.     Conjunctiva/sclera: Conjunctivae normal.     Pupils: Pupils are equal, round, and reactive to light.     Comments: Brown/hazel eyes.  Cardiovascular:     Rate and Rhythm: Normal rate and regular rhythm.     Pulses: Normal pulses.     Heart sounds: Normal heart sounds. No murmur heard.   Pulmonary:     Effort: Pulmonary effort is normal. No respiratory distress.     Breath sounds: Normal breath sounds. No wheezing or rales.  Chest:     Chest wall: No tenderness.  Abdominal:     General: Bowel sounds are normal. There is no distension.     Palpations:  Abdomen is soft. There is no hepatomegaly, splenomegaly or mass.     Tenderness: There is no abdominal tenderness.  Musculoskeletal:        General: No swelling or tenderness. Normal range of motion.     Cervical back: Normal range of motion and neck supple. No tenderness.  Lymphadenopathy:     Head:     Right side of head: No  preauricular, posterior auricular or occipital adenopathy.     Left side of head: No preauricular, posterior auricular or occipital adenopathy.     Cervical: No cervical adenopathy.     Upper Body:     Right upper body: No supraclavicular or axillary adenopathy.     Left upper body: No supraclavicular or axillary adenopathy.     Lower Body: No right inguinal adenopathy. No left inguinal adenopathy.  Skin:    General: Skin is warm and dry.     Coloration: Skin is not jaundiced.     Findings: No rash.  Neurological:     Mental Status: He is alert and oriented to person, place, and time. Mental status is at baseline.     Cranial Nerves: No cranial nerve deficit.     Sensory: No sensory deficit.     Motor: No weakness.  Psychiatric:        Mood and Affect: Mood normal.        Behavior: Behavior normal.        Thought Content: Thought content normal.        Judgment: Judgment normal.    No visits with results within 3 Day(s) from this visit.  Latest known visit with results is:  Hospital Outpatient Visit on 05/22/2020  Component Date Value Ref Range Status  . Glucose-Capillary 05/22/2020 94  70 - 99 mg/dL Final   Glucose reference range applies only to samples taken after fasting for at least 8 hours.    Assessment:  MACALLAN ORD is a 64 y.o. male with a 50-pack-year smoking history and a RUL mass.  Chest CT angiogram on 05/16/2020 revealed no evidence of pulmonary embolism.  There was a 2.1 x 1.7 cm spiculated nodule in the right lung apex suspicious for bronchogenic carcinoma.  In addition there was a 2.7 x 1.5 cm heterogeneously enhancing lesion in  the right adrenal gland concerning for metastatic disease.  There was diffuse circumferential thickening of the thoracic esophagus likely correlating with the symptoms of esophagitis.  PET scan on 05/22/2020 revealed a hypermetabolic spiculated 1.9 x 2.2 cm (SUV 10.9) right upper lobe nodule with hypermetabolic bilateral mediastinal and hilar lymph nodes (5-7 mm; SUV 3.1 - 4.0), bilateral adrenal lesions (right 1.5 x 2.5 cm; SUV 28.6; left SUV 5.6) and lytic osseous lesions (cervical spine, right scapula (2.9 x 3.4 cm; SUV 41.3), left 10th rib, L3, right sacrum, right pubic bone) c/w stage IV primary bronchogenic carcinoma.  There was focal hypermetabolism in the sellar region, without a definite CT correlate. There was new peribronchovascular ground-glass in the superior segment left lower lobe, likely infectious or inflammatory in etiology.  There was aortic atherosclerosis, coronary artery calcification, and emphysema.  Barium swallow on 05/17/2020 revealed mild mucosal thickening and irregularity without ulceration.  There was mild gastroesophageal reflux and no esophageal stricture.  Head MRI on 05/29/2020 revealed sub-5 mm bilateral cerebellar enhancing foci concerning for metastases. There was sub-5 mm right parietal bone and basilar occipital enhancing foci, concerning for osseous metastases. There was sequela of remote left thalamic and right midbrain insults. There was mild cerebral atrophy and chronic microvascular ischemic changes  Cervical spine MRI on 05/29/2020 revealed metastatic soft tissue involving the left C5-6 facet joint and right anterolateral C5 vertebral body. There was extension of tumor into the prevertebral and circumferential epidural space at the C5-6 level without significant spinal canal narrowing. Tumor also extends to involve the left C5-6 and C6-7 transverse and neural foramen with partial encasement  of the vertebral artery.  There was no evidence of spinal cord  enhancement.  Lumbar spine MRI on 05/29/2020 revealed L3 metastatic lesion and pathologic fracture with approximately 20% height loss. There was extension of tumor into the prevertebral and anterior epidural space at the L3 level with mild spinal canal and bilateral neural foraminal narrowing.  There was partially imaged 4.2 cm right lumbosacral junction/SI joint metastasis with extension of tumor into the right L4-5 neural foramen. There was possible associated pathologic fracture.  Sacrum SI joint MRI on 05/29/2020 revealed a 4.4 x 2.5 x 4.7 cm destructive enhancing mass in the right sacrum and a 3.1 x 1.0 x 4.8 cm posterior right ilium mass with impingement on the right L5 root in the periphery of the foramen and beyond the foramen.  Symptomatically, his pain has improved with pain mediations.  Weight is stable.  Plan: 1.   Metastatic adenocarcinoma             Clincally suspect metastatic lung cancer (stage [id number]).  Chest CT on 05/16/2020 revealed a 2.1 cm right upper lobe mass and a 2.7 cm right adrenal lesion.             PET scan on 05/22/2020 revealed a 2.2 cm RUL hypermetabolic lesion with associated bilateral mediastinal nodes.   There are bilateral adrenal lesions and multiple lytic bone lesions.             Review right sacral ala biopsy.   Biopsy reveals adenocarcinoma (unknown primary).   Additional IHC studies pending   Send tissue for [facility].  Discuss treatment based on site of primary origin as well as driver mutations.  Discuss symptom management.  He has pain medications at home to use on a prn bases.  Interventions are adequate.    2.   Bone metastasis  Patient with several symptomatic areas.  Review MRI of the head, cervical spine, lumbar spine and sacrum.   Patient has tiny CNS metastasis.   He has metastasis involving left C5-6 facet joint, right C5 vertebral body, C5-6 epidural space and C6-7 transverse and neuroforamen.   He has involvement of the SI joint  and right sacrum.  Discuss consideration of palliative radiation to painful at risk lesions.   Patient scheduled to see Dr Douglas [name] next week.  Discuss Xgeva to prevent bone related events.   Potential side effects of Xgeva were reviewed.   Patient is edentulous and has no dental issues.   Douglas [name].  Continue current pain medications.   He has been on OxyContin 10 mg q 12 hours since 05/26/2020.  He continues oxycodone 5 mg q 4 hours prn.  Continue pain diary to adjust long-term pain medications. 3.   Dysphagia             Etiology of circumferential thickening of the esophagus likely related to esophagitis.             Patient has had significant dysphagia despite Protonix.  Follow-up pending GI consult. 4.   Weight loss  Weight up 1 pound.  Patient with dysphagia but able to tolerate liquids.  Await upper endoscopy by GI.  Follow-up nutrition consult. 5.   Code status  DNR/DNI code status.  MOST form completed. 6.   Tumor board on 05/30/2020. 7.   RTC in 1 week for MD assessment, review of final pathology, and finalization of treatment plan.  Addendum:   Right sacral biopsy on 05/27/2020 revealed metastatic CK7+ adenocarcinoma.  Tumor was  negative for TTF-1, Napsin A, CK20, GATA3, p40, CDX2, PSA and PSA-P.  Findings are compatible with poorly differentiated CK7 positive adenocarcinoma.  Together with the patient's radiographic findings, features are highly concerning for metastatic pulmonary adenocarcinoma.  However additional pulmonary markers TTF-1 and Napsin a were negative.  Other potential CK positive adenocarcinoma should be considered including upper GI tract, pancreaticobiliary and mammary primaries.  The lack of staining for CDX2 and GATA3 would suggest against these sites.  Clinical and radiographic correlation is essential.  Await follow-up with GI.  Additional labs were requested including CA 19-9 and CEA.  I discussed the assessment and treatment plan with the patient.   The patient was provided an opportunity to ask questions and all were answered.  The patient agreed with the plan and demonstrated an understanding of the instructions.  The patient was advised to call back if the symptoms worsen or if the condition fails to improve as anticipated.  I provided 20 minutes of face-to-face time during this this encounter and > 50% was spent counseling as documented under my assessment and plan. An additional 10 minutes were spent reviewing his chart (Epic and Care Everywhere) including notes, labs, and imaging studies.  An additional 10 minutes were spent reviewing his chart (Epic and Care Everywhere) including notes, labs, and imaging studies.    Laquiesha Piacente C. Mike Gip, MD, PhD    05/30/2020, 11:58 AM  I, Selena Batten, am acting as scribe for [employer]. Mike Gip, MD, PhD.  I, Paiton Boultinghouse C. Mike Gip, MD, have reviewed the above documentation for accuracy and completeness, and I agree with the above.

## 2020-05-29 NOTE — Telephone Encounter (Signed)
Called report IMPRESSION: MRI brain:  -Sub-5 mm bilateral cerebellar enhancing foci are concerning for metastases.  -Sub-5 mm right parietal bone and basilar occipital enhancing foci, concerning for osseous metastases.  -Sequela of remote left thalamic and right midbrain insults. Mild cerebral atrophy and chronic microvascular ischemic changes.  MRI cervical spine:  -Metastatic soft tissue involving the left C5-6 facet joint and right anterolateral C5 vertebral body. There is extension of tumor into the prevertebral and circumferential epidural space at the C5-6 level without significant spinal canal narrowing.  -Tumor also extends to involve the left C5-6 and C6-7 transverse and neural foramen with partial encasement of the vertebral artery.  -No evidence of spinal cord enhancement.  MRI lumbar spine:  - L3 metastatic lesion and pathologic fracture with approximately 20% height loss. There is extension of tumor into the prevertebral and anterior epidural space at the L3 level with mild spinal canal and bilateral neural foraminal narrowing.  - Partially imaged 4.2 cm right lumbosacral junction/SI joint metastasis with extension of tumor into the right L4-5 neural foramen. Query associated pathologic fracture.  - Please see concurrent MR sacrum for better characterization.  These results will be called to the ordering clinician or representative by the Radiologist Assistant, and communication documented in the PACS or [employer].   Electronically Signed   By: Primitivo Gauze M.D.   On: 05/29/2020 14:49

## 2020-05-30 ENCOUNTER — Inpatient Hospital Stay (HOSPITAL_BASED_OUTPATIENT_CLINIC_OR_DEPARTMENT_OTHER): Payer: Medicare Other | Admitting: Hematology and Oncology

## 2020-05-30 ENCOUNTER — Ambulatory Visit: Payer: Medicare Other | Admitting: Gastroenterology

## 2020-05-30 ENCOUNTER — Encounter: Payer: Self-pay | Admitting: *Deleted

## 2020-05-30 ENCOUNTER — Encounter: Payer: Self-pay | Admitting: Hematology and Oncology

## 2020-05-30 VITALS — BP 137/78 | HR 71 | Temp 97.7°F | Resp 18 | Ht 65.0 in | Wt 139.6 lb

## 2020-05-30 DIAGNOSIS — G893 Neoplasm related pain (acute) (chronic): Secondary | ICD-10-CM | POA: Diagnosis not present

## 2020-05-30 DIAGNOSIS — R918 Other nonspecific abnormal finding of lung field: Secondary | ICD-10-CM

## 2020-05-30 DIAGNOSIS — E278 Other specified disorders of adrenal gland: Secondary | ICD-10-CM

## 2020-05-30 DIAGNOSIS — C7951 Secondary malignant neoplasm of bone: Secondary | ICD-10-CM

## 2020-05-30 DIAGNOSIS — R1310 Dysphagia, unspecified: Secondary | ICD-10-CM

## 2020-05-30 DIAGNOSIS — Z7189 Other specified counseling: Secondary | ICD-10-CM

## 2020-05-30 MED ORDER — OXYCODONE HCL 5 MG PO TABS: 5.0000 mg | ORAL_TABLET | ORAL | 0 refills | Status: DC | PRN

## 2020-05-30 NOTE — Progress Notes (Signed)
Pain level today 7 upper back and neck /  but better.

## 2020-05-30 NOTE — Progress Notes (Deleted)
Gastroenterology Consultation  Referring Provider:     [name], MD Primary Care Physician:  Patient, No Pcp Per Primary Gastroenterologist:  Dr. Allen Norris     Reason for Consultation:     Dysphagia        HPI:   Douglas [name] is a 64 y.o. y/o male referred for consultation & management of Dysphagia by Dr. Patient, No Pcp Per.  This patient comes to me with a history of a lung mass and he is being followed by oncology.  The patient was found to have a lung mass With an MRI staging showing some lesions suggestive of metastasis. The patient had a double contrast barium swallow that showed:  IMPRESSION: 1. Mild mucosal thickening and irregularity without ulceration as can be seen with mild esophagitis. 2. Mild gastroesophageal reflux. 3. No esophageal stricture.  Past Medical History:  Diagnosis Date  . COPD (chronic obstructive pulmonary disease) ([place])   . Hyperlipidemia   . Hypertension   . Lesion of adrenal gland ([place])   . Lung mass     No past surgical history on file.  Prior to Admission medications   Medication Sig Start Date End Date Taking? Authorizing Provider  albuterol (VENTOLIN HFA) 108 (90 Base) MCG/ACT inhaler Inhale 1-2 puffs into the lungs every 6 (six) hours as needed for wheezing or shortness of breath. 05/16/20   [name], MD  albuterol (VENTOLIN HFA) 108 (90 Base) MCG/ACT inhaler Inhale 2 puffs into the lungs every 6 (six) hours as needed for wheezing or shortness of breath. 05/17/20   [name], MD  azithromycin (ZITHROMAX Z-PAK) 250 MG tablet Take 2 tablets (500 mg) on  Day 1,  followed by 1 tablet (250 mg) once daily on Days 2 through 5. 05/28/20 06/02/20  Tyler Pita, MD  Budeson-Glycopyrrol-Formoterol (BREZTRI AEROSPHERE) 160-9-4.8 MCG/ACT AERO Inhale 2 puffs into the lungs in the morning and at bedtime. 05/28/20   Tyler Pita, MD  gabapentin (NEURONTIN) 300 MG capsule Take 300 mg by mouth 2 (two) times daily. 05/02/20   [provider]  LORazepam (ATIVAN) 0.5 MG tablet Take 1 tablet (0.5 mg total) by mouth once as needed for up to 1 dose for anxiety (prior to MRI). Patient not taking: Reported on 05/30/2020 05/28/20   [name], MD  losartan-hydrochlorothiazide (HYZAAR) 50-12.5 MG tablet Take 1 tablet by mouth daily. 04/30/20   [provider]  mometasone-formoterol (DULERA) 200-5 MCG/ACT AERO Inhale 2 puffs into the lungs 2 (two) times daily. 05/17/20   [name], MD  naloxone [facility]) nasal spray 4 mg/0.1 mL 1 spray into nostril upon signs of opioid overdose. Call 911. May repeat once if no response within 2-3 minutes. Patient not taking: Reported on 05/30/2020 05/24/20   Borders, [name], NP  oxyCODONE (OXY IR/ROXICODONE) 5 MG immediate release tablet Take 1 tablet (5 mg total) by mouth every 4 (four) hours as needed for severe pain. 05/23/20   [name], MD  oxyCODONE (OXYCONTIN) 10 mg 12 hr tablet Take 1 tablet (10 mg total) by mouth every 12 (twelve) hours. 05/24/20   Borders, [name], NP  pantoprazole (PROTONIX) 40 MG tablet Take 1 tablet (40 mg total) by mouth daily. 05/17/20 08/15/20  [name], MD  predniSONE (STERAPRED UNI-PAK 21 TAB) 10 MG (21) TBPK tablet Take as directed in package.  This is a taper pack. 05/28/20   Tyler Pita, MD  rosuvastatin (CRESTOR) 10 MG tablet  Take 10 mg by mouth daily. Patient not taking: Reported on 05/28/2020 03/13/20   [provider]  sucralfate (CARAFATE) 1 g tablet Take 1 tablet (1 g total) by mouth 4 (four) times daily -  with meals and at bedtime for 7 days. 05/24/20 05/31/20  Borders, [name], NP  tamsulosin (FLOMAX) 0.4 MG CAPS capsule Take 0.4 mg by mouth every evening. Patient not taking: Reported on 05/28/2020 04/01/20   [provider]    Family History  Problem Relation Age of Onset  . Cancer Neg Hx      Social History   Tobacco Use  . Smoking status: Current Every Day Smoker    Packs/day: 0.50    Years:  45.00    Pack years: 22.50    Types: Cigarettes    Start date: 05/29/1975  . Smokeless tobacco: Never Used  . Tobacco comment: 1 pack/week  Vaping Use  . Vaping Use: Never used  Substance Use Topics  . Alcohol use: Yes    Comment: 2 beers per day  . Drug use: Not on file    Allergies as of 05/30/2020  . (No Known Allergies)    Review of Systems:    All systems reviewed and negative except where noted in HPI.   Physical Exam:  There were no vitals taken for this visit. No LMP for male patient. General:   Alert,  Well-developed, well-nourished, pleasant and cooperative in NAD Head:  Normocephalic and atraumatic. Eyes:  Sclera clear, no icterus.   Conjunctiva pink. Ears:  Normal auditory acuity. Neck:  Supple; no masses or thyromegaly. Lungs:  Respirations even and unlabored.  Clear throughout to auscultation.   No wheezes, crackles, or rhonchi. No acute distress. Heart:  Regular rate and rhythm; no murmurs, clicks, rubs, or gallops. Abdomen:  Normal bowel sounds.  No bruits.  Soft, non-tender and non-distended without masses, hepatosplenomegaly or hernias noted.  No guarding or rebound tenderness.  Negative Carnett sign.   Rectal:  Deferred.  Pulses:  Normal pulses noted. Extremities:  No clubbing or edema.  No cyanosis. Neurologic:  Alert and oriented x3;  grossly normal neurologically. Skin:  Intact without significant lesions or rashes.  No jaundice. Lymph Nodes:  No significant cervical adenopathy. Psych:  Alert and cooperative. Normal mood and affect.  Imaging Studies: DG Chest 2 View  Result Date: 05/16/2020 CLINICAL DATA:  Dysphagia, chest pain EXAM: CHEST - 2 VIEW COMPARISON:  05/02/2020 small FINDINGS: The heart size and mediastinal contours are within normal limits. Atherosclerotic calcification of the aortic knob. Mildly hyperinflated lungs. Streaky interstitial opacity within the right upper lobe. No pleural effusion or pneumothorax. The visualized skeletal  structures are unremarkable. IMPRESSION: 1. Streaky interstitial opacity within the right upper lobe suspicious for developing infection. 2. Mildly hyperinflated lungs, suggesting underlying COPD. Electronically Signed   By: [name] D.O.   On: 05/16/2020 08:25   CT Angio Chest PE W and/or Wo Contrast  Result Date: 05/16/2020 CLINICAL DATA:  Concern for pill stuck in throat for 2 days, now with pain with swallowing EXAM: CT ANGIOGRAPHY CHEST WITH CONTRAST TECHNIQUE: Multidetector CT imaging of the chest was performed using the standard protocol during bolus administration of intravenous contrast. Multiplanar CT image reconstructions and MIPs were obtained to evaluate the vascular anatomy. CONTRAST:  80mL OMNIPAQUE IOHEXOL 350 MG/ML SOLN COMPARISON:  Radiograph 05/16/2020 FINDINGS: Cardiovascular: Satisfactory opacification the pulmonary arteries to the segmental level. No pulmonary artery filling defects are identified. Central pulmonary arteries are normal caliber.  Atherosclerotic plaque within the normal caliber aorta. No acute luminal abnormality nor periaortic stranding or hemorrhage. Normal 3 vessel branching of the aortic arch. Atheromatous plaque in the otherwise normal proximal great vessels. Normal heart size. No pericardial effusion. Coronary artery atherosclerosis is present. Mediastinum/Nodes: No mediastinal fluid or gas. Normal thyroid gland and thoracic inlet. No acute abnormality of the trachea. Diffuse circumferential thickening of the thoracic esophagus without para esophageal fluid, gas or adenopathy. No worrisome mediastinal, hilar or axillary adenopathy. Lungs/Pleura: Corresponding to the streaky opacity present in the right upper lobe on comparison chest radiograph spiculated nodule in the right lung apex measuring 2.1 x 1.7 cm in size highly conspicuous for a bronchogenic carcinoma. Additional subpleural 3 mm nodule in the posterior segment right upper lobe (5/34). 2 mm nodule along  the left major fissure in the left upper lobe (5/44). No other conspicuous nodules or masses. Background of centrilobular and paraseptal emphysema in the lungs. No consolidation, features of edema, pneumothorax, or effusion. Upper Abdomen: 2.7 x 1.5 cm heterogeneously enhancing which appears to arise from the adrenal gland concerning for potential metastatic disease. No other acute upper abdominal abnormality. Musculoskeletal: The osseous structures appear diffusely demineralized which may limit detection of small or nondisplaced fractures. No acute osseous abnormality or suspicious osseous lesion. Review of the MIP images confirms the above findings. IMPRESSION: 1. No evidence of pulmonary embolism. 2. Corresponding to the streaky opacity present in the right upper lobe on comparison chest radiograph, spiculated nodule in the right lung apex measuring 2.1 x 1.7 cm in size highly conspicuous for a bronchogenic carcinoma. Consider referral to multi disciplinary case conference for further management decisions. 3. 2.7 x 1.5 cm heterogeneously enhancing which appears to arise from the right adrenal gland concerning for potential metastatic disease. 4. Diffuse circumferential thickening of the thoracic esophagus, likely to correlate with patient's symptoms of esophagitis. 5. Aortic Atherosclerosis (ICD10-I70.0). 6. 7. These results were called by telephone at the time of interpretation on 05/16/2020 at 3:10 pm to provider [name] , who verbally acknowledged these results. Electronically Signed   By: Lovena Le M.D.   On: 05/16/2020 15:10   MR BRAIN W WO CONTRAST  Result Date: 05/29/2020 CLINICAL DATA:  Neoplasm, staging. EXAM: MRI HEAD WITHOUT AND WITH CONTRAST MRI CERVICAL SPINE WITHOUT AND WITH CONTRAST MRI LUMBAR SPINE WITHOUT AND WITH CONTRAST TECHNIQUE: Multiplanar, multiecho pulse sequences of the brain and surrounding structures, and cervical spine, to include the craniocervical junction and  cervicothoracic junction, were obtained without and with intravenous contrast. Multiplanar and multiecho pulse sequences of the lumbar spine were obtained without and with intravenous contrast. CONTRAST:  60mL GADAVIST GADOBUTROL 1 MMOL/ML IV SOLN COMPARISON:  05/02/2020 lumbar spine radiographs. 05/22/2020 nuclear medicine PET scan. FINDINGS: MRI HEAD FINDINGS Brain: Scattered T2 hyperintense periventricular and subcortical white matter foci are nonspecific however commonly associated with chronic microvascular ischemic changes. Mild cerebral atrophy with ex vacuo dilatation. No acute infarct or intracranial hemorrhage. Tiny left temporal remote microhemorrhage. Sequela of remote left thalamic and right midbrain insults. No midline shift, ventriculomegaly or extra-axial fluid collection. Normal appearance of the sella turcica and pituitary gland. -3 mm inferior right cerebellar enhancing focus (18:14). -punctate medial left cerebellar enhancement (18:34). Vascular: Major intracranial flow voids are preserved. Skull and upper cervical spine: 2 mm right parietal enhancing focus (18:120). Scattered enhancing foci measuring up to 4 mm within the basilar and anterolateral occipital bone (18:24). Sinuses/Orbits: Normal orbits. Minimal ethmoid and left maxillary sinus mucosal thickening. Trace right  mastoid effusion. Other: 6 mm nonenhancing diffusion restricting right occipital scalp nodule (5:16), likely an epidermal inclusion cyst. MRI CERVICAL SPINE FINDINGS Alignment: Normal. Vertebrae: Diffusely T1 hypointense appearance of the bone marrow. Vertebral body heights are preserved. Bone marrow edema involving the left C4-5 and C5-6 facet joints. Cord: Normal signal intensity morphology.  No abnormal enhancement. Posterior Fossa, vertebral arteries: Please see MRI brain. Disc levels: Multilevel desiccation.  Mild C5-6 disc space loss. C2-3: No significant disc bulge, spinal canal or neural foraminal narrowing. C3-4: Small  central protrusion with uncovertebral and facet hypertrophy. Patent spinal canal and left neural foramen. Mild right neural foraminal narrowing. C4-5: There is ill-defined enhancing soft tissue encasing the left C4-5 facet joint measuring 2.0 x 1.6 cm (12:16). The soft tissue extends into the left neural and transverse foramen, encasing the left vertebral artery (12:15). Left predominant and dorsal C5 vertebral body erosions. Prevertebral soft tissue thickening and enhancement is concerning for extension of tumor. There is also ill-defined enhancement along the right anterolateral C5 vertebral body (12:15). Inflammatory changes are seen within the left paraspinal region (11:13). Circumferential soft tissue thickening and enhancement involving the epidural space at the C4-5 level. Small disc osteophyte complex with uncovertebral and right facet hypertrophy. Mild spinal canal narrowing. Patent right neural foramen. Moderate left neural foraminal narrowing secondary to soft tissue encroachment. C5-6: There is inferior extension of ill-defined soft tissue along the left transverse/neural foramen and anterior left facet joint (12:12) left facet joint bone marrow edema. Prominence of mild enhancement involving the epidural space. Disc osteophyte complex with uncovertebral and facet hypertrophy. Mild spinal canal and right neural foraminal narrowing. Soft tissue encroachment narrows the left neural foramen. C6-7: Disc osteophyte complex with uncovertebral and facet hypertrophy. Patent spinal canal and bilateral neural foramina. C7-T1: Disc osteophyte complex with uncovertebral and facet hypertrophy. Patent spinal canal and neural foramen. Paraspinal tissues: As detailed above. MRI LUMBAR SPINE FINDINGS Segmentation:  Standard. Alignment:  Normal. Vertebrae: Diffuse bone marrow heterogeneity. Multilevel osteophytosis. Metastases are detailed below. Conus medullaris and cauda equina: Conus extends to the L2 level. Conus and  cauda equina appear normal. Filar lipoma. Disc levels: Multilevel desiccation and disc space loss most prominent at the L5-S1 level. L1-2: No significant disc bulge, spinal canal or neural foraminal narrowing. L2-3: Disc bulge, ligamentum flavum and bilateral facet hypertrophy. Mild spinal canal and bilateral neural foraminal narrowing. L3-4: L3 superior endplate pathologic fracture deformity with approximately 10-20% height loss. 4.7 x 3.4 cm enhancing metastatic lesion involving the right L3 vertebral body and posterior elements (13:19). There is extension of enhancing tumor into the prevertebral and anterior epidural spaces at the L3 level (13:20, 8:19) effacing the lateral recesses and abutting the exiting bilateral L3 nerve roots. Ligamentum flavum and bilateral facet hypertrophy. Mild spinal canal and bilateral neural foraminal narrowing. L4-5: Disc bulge abutting the exiting left L4 nerve root, ligamentum flavum and bilateral facet hypertrophy. Mild spinal canal and moderate bilateral neural foraminal narrowing. Abnormal right L4-5 neural foramen soft tissue is discussed below. L5-S1: Disc bulge with superimposed central protrusion abutting the descending bilateral S1 nerve roots, ligamentum flavum and bilateral facet hypertrophy. Patent spinal canal. Mild bilateral neural foraminal narrowing. Paraspinal and other soft tissues: At the level of the lumbosacral junction and extending along the right sacroiliac joint there is enhancing soft tissue measuring 4.2 x 2.6 cm (13:34). Soft tissue extends into the right L4-5 neural foramen (8:31) there are adjacent erosions involving the right lateral L5 vertebral body and superior right sacral ala.  Anterior cortical defect is concerning for an associated pathologic fracture (8:36). IMPRESSION: MRI brain: -Sub-5 mm bilateral cerebellar enhancing foci are concerning for metastases. -Sub-5 mm right parietal bone and basilar occipital enhancing foci, concerning for  osseous metastases. -Sequela of remote left thalamic and right midbrain insults. Mild cerebral atrophy and chronic microvascular ischemic changes. MRI cervical spine: -Metastatic soft tissue involving the left C5-6 facet joint and right anterolateral C5 vertebral body. There is extension of tumor into the prevertebral and circumferential epidural space at the C5-6 level without significant spinal canal narrowing. -Tumor also extends to involve the left C5-6 and C6-7 transverse and neural foramen with partial encasement of the vertebral artery. -No evidence of spinal cord enhancement. MRI lumbar spine: - L3 metastatic lesion and pathologic fracture with approximately 20% height loss. There is extension of tumor into the prevertebral and anterior epidural space at the L3 level with mild spinal canal and bilateral neural foraminal narrowing. - Partially imaged 4.2 cm right lumbosacral junction/SI joint metastasis with extension of tumor into the right L4-5 neural foramen. Query associated pathologic fracture. - Please see concurrent MR sacrum for better characterization. These results will be called to the ordering clinician or representative by the Radiologist Assistant, and communication documented in the PACS or [employer]. Electronically Signed   By: Primitivo Gauze M.D.   On: 05/29/2020 14:49   MR Cervical Spine W Wo Contrast  Result Date: 05/29/2020 CLINICAL DATA:  Neoplasm, staging. EXAM: MRI HEAD WITHOUT AND WITH CONTRAST MRI CERVICAL SPINE WITHOUT AND WITH CONTRAST MRI LUMBAR SPINE WITHOUT AND WITH CONTRAST TECHNIQUE: Multiplanar, multiecho pulse sequences of the brain and surrounding structures, and cervical spine, to include the craniocervical junction and cervicothoracic junction, were obtained without and with intravenous contrast. Multiplanar and multiecho pulse sequences of the lumbar spine were obtained without and with intravenous contrast. CONTRAST:  86mL GADAVIST GADOBUTROL 1 MMOL/ML IV  SOLN COMPARISON:  05/02/2020 lumbar spine radiographs. 05/22/2020 nuclear medicine PET scan. FINDINGS: MRI HEAD FINDINGS Brain: Scattered T2 hyperintense periventricular and subcortical white matter foci are nonspecific however commonly associated with chronic microvascular ischemic changes. Mild cerebral atrophy with ex vacuo dilatation. No acute infarct or intracranial hemorrhage. Tiny left temporal remote microhemorrhage. Sequela of remote left thalamic and right midbrain insults. No midline shift, ventriculomegaly or extra-axial fluid collection. Normal appearance of the sella turcica and pituitary gland. -3 mm inferior right cerebellar enhancing focus (18:14). -punctate medial left cerebellar enhancement (18:34). Vascular: Major intracranial flow voids are preserved. Skull and upper cervical spine: 2 mm right parietal enhancing focus (18:120). Scattered enhancing foci measuring up to 4 mm within the basilar and anterolateral occipital bone (18:24). Sinuses/Orbits: Normal orbits. Minimal ethmoid and left maxillary sinus mucosal thickening. Trace right mastoid effusion. Other: 6 mm nonenhancing diffusion restricting right occipital scalp nodule (5:16), likely an epidermal inclusion cyst. MRI CERVICAL SPINE FINDINGS Alignment: Normal. Vertebrae: Diffusely T1 hypointense appearance of the bone marrow. Vertebral body heights are preserved. Bone marrow edema involving the left C4-5 and C5-6 facet joints. Cord: Normal signal intensity morphology.  No abnormal enhancement. Posterior Fossa, vertebral arteries: Please see MRI brain. Disc levels: Multilevel desiccation.  Mild C5-6 disc space loss. C2-3: No significant disc bulge, spinal canal or neural foraminal narrowing. C3-4: Small central protrusion with uncovertebral and facet hypertrophy. Patent spinal canal and left neural foramen. Mild right neural foraminal narrowing. C4-5: There is ill-defined enhancing soft tissue encasing the left C4-5 facet joint measuring  2.0 x 1.6 cm (12:16). The soft tissue extends into  the left neural and transverse foramen, encasing the left vertebral artery (12:15). Left predominant and dorsal C5 vertebral body erosions. Prevertebral soft tissue thickening and enhancement is concerning for extension of tumor. There is also ill-defined enhancement along the right anterolateral C5 vertebral body (12:15). Inflammatory changes are seen within the left paraspinal region (11:13). Circumferential soft tissue thickening and enhancement involving the epidural space at the C4-5 level. Small disc osteophyte complex with uncovertebral and right facet hypertrophy. Mild spinal canal narrowing. Patent right neural foramen. Moderate left neural foraminal narrowing secondary to soft tissue encroachment. C5-6: There is inferior extension of ill-defined soft tissue along the left transverse/neural foramen and anterior left facet joint (12:12) left facet joint bone marrow edema. Prominence of mild enhancement involving the epidural space. Disc osteophyte complex with uncovertebral and facet hypertrophy. Mild spinal canal and right neural foraminal narrowing. Soft tissue encroachment narrows the left neural foramen. C6-7: Disc osteophyte complex with uncovertebral and facet hypertrophy. Patent spinal canal and bilateral neural foramina. C7-T1: Disc osteophyte complex with uncovertebral and facet hypertrophy. Patent spinal canal and neural foramen. Paraspinal tissues: As detailed above. MRI LUMBAR SPINE FINDINGS Segmentation:  Standard. Alignment:  Normal. Vertebrae: Diffuse bone marrow heterogeneity. Multilevel osteophytosis. Metastases are detailed below. Conus medullaris and cauda equina: Conus extends to the L2 level. Conus and cauda equina appear normal. Filar lipoma. Disc levels: Multilevel desiccation and disc space loss most prominent at the L5-S1 level. L1-2: No significant disc bulge, spinal canal or neural foraminal narrowing. L2-3: Disc bulge, ligamentum  flavum and bilateral facet hypertrophy. Mild spinal canal and bilateral neural foraminal narrowing. L3-4: L3 superior endplate pathologic fracture deformity with approximately 10-20% height loss. 4.7 x 3.4 cm enhancing metastatic lesion involving the right L3 vertebral body and posterior elements (13:19). There is extension of enhancing tumor into the prevertebral and anterior epidural spaces at the L3 level (13:20, 8:19) effacing the lateral recesses and abutting the exiting bilateral L3 nerve roots. Ligamentum flavum and bilateral facet hypertrophy. Mild spinal canal and bilateral neural foraminal narrowing. L4-5: Disc bulge abutting the exiting left L4 nerve root, ligamentum flavum and bilateral facet hypertrophy. Mild spinal canal and moderate bilateral neural foraminal narrowing. Abnormal right L4-5 neural foramen soft tissue is discussed below. L5-S1: Disc bulge with superimposed central protrusion abutting the descending bilateral S1 nerve roots, ligamentum flavum and bilateral facet hypertrophy. Patent spinal canal. Mild bilateral neural foraminal narrowing. Paraspinal and other soft tissues: At the level of the lumbosacral junction and extending along the right sacroiliac joint there is enhancing soft tissue measuring 4.2 x 2.6 cm (13:34). Soft tissue extends into the right L4-5 neural foramen (8:31) there are adjacent erosions involving the right lateral L5 vertebral body and superior right sacral ala. Anterior cortical defect is concerning for an associated pathologic fracture (8:36). IMPRESSION: MRI brain: -Sub-5 mm bilateral cerebellar enhancing foci are concerning for metastases. -Sub-5 mm right parietal bone and basilar occipital enhancing foci, concerning for osseous metastases. -Sequela of remote left thalamic and right midbrain insults. Mild cerebral atrophy and chronic microvascular ischemic changes. MRI cervical spine: -Metastatic soft tissue involving the left C5-6 facet joint and right  anterolateral C5 vertebral body. There is extension of tumor into the prevertebral and circumferential epidural space at the C5-6 level without significant spinal canal narrowing. -Tumor also extends to involve the left C5-6 and C6-7 transverse and neural foramen with partial encasement of the vertebral artery. -No evidence of spinal cord enhancement. MRI lumbar spine: - L3 metastatic lesion and pathologic fracture with  approximately 20% height loss. There is extension of tumor into the prevertebral and anterior epidural space at the L3 level with mild spinal canal and bilateral neural foraminal narrowing. - Partially imaged 4.2 cm right lumbosacral junction/SI joint metastasis with extension of tumor into the right L4-5 neural foramen. Query associated pathologic fracture. - Please see concurrent MR sacrum for better characterization. These results will be called to the ordering clinician or representative by the Radiologist Assistant, and communication documented in the PACS or [employer]. Electronically Signed   By: Primitivo Gauze M.D.   On: 05/29/2020 14:49   MR Lumbar Spine W Wo Contrast  Result Date: 05/29/2020 CLINICAL DATA:  Neoplasm, staging. EXAM: MRI HEAD WITHOUT AND WITH CONTRAST MRI CERVICAL SPINE WITHOUT AND WITH CONTRAST MRI LUMBAR SPINE WITHOUT AND WITH CONTRAST TECHNIQUE: Multiplanar, multiecho pulse sequences of the brain and surrounding structures, and cervical spine, to include the craniocervical junction and cervicothoracic junction, were obtained without and with intravenous contrast. Multiplanar and multiecho pulse sequences of the lumbar spine were obtained without and with intravenous contrast. CONTRAST:  74mL GADAVIST GADOBUTROL 1 MMOL/ML IV SOLN COMPARISON:  05/02/2020 lumbar spine radiographs. 05/22/2020 nuclear medicine PET scan. FINDINGS: MRI HEAD FINDINGS Brain: Scattered T2 hyperintense periventricular and subcortical white matter foci are nonspecific however commonly  associated with chronic microvascular ischemic changes. Mild cerebral atrophy with ex vacuo dilatation. No acute infarct or intracranial hemorrhage. Tiny left temporal remote microhemorrhage. Sequela of remote left thalamic and right midbrain insults. No midline shift, ventriculomegaly or extra-axial fluid collection. Normal appearance of the sella turcica and pituitary gland. -3 mm inferior right cerebellar enhancing focus (18:14). -punctate medial left cerebellar enhancement (18:34). Vascular: Major intracranial flow voids are preserved. Skull and upper cervical spine: 2 mm right parietal enhancing focus (18:120). Scattered enhancing foci measuring up to 4 mm within the basilar and anterolateral occipital bone (18:24). Sinuses/Orbits: Normal orbits. Minimal ethmoid and left maxillary sinus mucosal thickening. Trace right mastoid effusion. Other: 6 mm nonenhancing diffusion restricting right occipital scalp nodule (5:16), likely an epidermal inclusion cyst. MRI CERVICAL SPINE FINDINGS Alignment: Normal. Vertebrae: Diffusely T1 hypointense appearance of the bone marrow. Vertebral body heights are preserved. Bone marrow edema involving the left C4-5 and C5-6 facet joints. Cord: Normal signal intensity morphology.  No abnormal enhancement. Posterior Fossa, vertebral arteries: Please see MRI brain. Disc levels: Multilevel desiccation.  Mild C5-6 disc space loss. C2-3: No significant disc bulge, spinal canal or neural foraminal narrowing. C3-4: Small central protrusion with uncovertebral and facet hypertrophy. Patent spinal canal and left neural foramen. Mild right neural foraminal narrowing. C4-5: There is ill-defined enhancing soft tissue encasing the left C4-5 facet joint measuring 2.0 x 1.6 cm (12:16). The soft tissue extends into the left neural and transverse foramen, encasing the left vertebral artery (12:15). Left predominant and dorsal C5 vertebral body erosions. Prevertebral soft tissue thickening and  enhancement is concerning for extension of tumor. There is also ill-defined enhancement along the right anterolateral C5 vertebral body (12:15). Inflammatory changes are seen within the left paraspinal region (11:13). Circumferential soft tissue thickening and enhancement involving the epidural space at the C4-5 level. Small disc osteophyte complex with uncovertebral and right facet hypertrophy. Mild spinal canal narrowing. Patent right neural foramen. Moderate left neural foraminal narrowing secondary to soft tissue encroachment. C5-6: There is inferior extension of ill-defined soft tissue along the left transverse/neural foramen and anterior left facet joint (12:12) left facet joint bone marrow edema. Prominence of mild enhancement involving the epidural space. Disc osteophyte  complex with uncovertebral and facet hypertrophy. Mild spinal canal and right neural foraminal narrowing. Soft tissue encroachment narrows the left neural foramen. C6-7: Disc osteophyte complex with uncovertebral and facet hypertrophy. Patent spinal canal and bilateral neural foramina. C7-T1: Disc osteophyte complex with uncovertebral and facet hypertrophy. Patent spinal canal and neural foramen. Paraspinal tissues: As detailed above. MRI LUMBAR SPINE FINDINGS Segmentation:  Standard. Alignment:  Normal. Vertebrae: Diffuse bone marrow heterogeneity. Multilevel osteophytosis. Metastases are detailed below. Conus medullaris and cauda equina: Conus extends to the L2 level. Conus and cauda equina appear normal. Filar lipoma. Disc levels: Multilevel desiccation and disc space loss most prominent at the L5-S1 level. L1-2: No significant disc bulge, spinal canal or neural foraminal narrowing. L2-3: Disc bulge, ligamentum flavum and bilateral facet hypertrophy. Mild spinal canal and bilateral neural foraminal narrowing. L3-4: L3 superior endplate pathologic fracture deformity with approximately 10-20% height loss. 4.7 x 3.4 cm enhancing metastatic  lesion involving the right L3 vertebral body and posterior elements (13:19). There is extension of enhancing tumor into the prevertebral and anterior epidural spaces at the L3 level (13:20, 8:19) effacing the lateral recesses and abutting the exiting bilateral L3 nerve roots. Ligamentum flavum and bilateral facet hypertrophy. Mild spinal canal and bilateral neural foraminal narrowing. L4-5: Disc bulge abutting the exiting left L4 nerve root, ligamentum flavum and bilateral facet hypertrophy. Mild spinal canal and moderate bilateral neural foraminal narrowing. Abnormal right L4-5 neural foramen soft tissue is discussed below. L5-S1: Disc bulge with superimposed central protrusion abutting the descending bilateral S1 nerve roots, ligamentum flavum and bilateral facet hypertrophy. Patent spinal canal. Mild bilateral neural foraminal narrowing. Paraspinal and other soft tissues: At the level of the lumbosacral junction and extending along the right sacroiliac joint there is enhancing soft tissue measuring 4.2 x 2.6 cm (13:34). Soft tissue extends into the right L4-5 neural foramen (8:31) there are adjacent erosions involving the right lateral L5 vertebral body and superior right sacral ala. Anterior cortical defect is concerning for an associated pathologic fracture (8:36). IMPRESSION: MRI brain: -Sub-5 mm bilateral cerebellar enhancing foci are concerning for metastases. -Sub-5 mm right parietal bone and basilar occipital enhancing foci, concerning for osseous metastases. -Sequela of remote left thalamic and right midbrain insults. Mild cerebral atrophy and chronic microvascular ischemic changes. MRI cervical spine: -Metastatic soft tissue involving the left C5-6 facet joint and right anterolateral C5 vertebral body. There is extension of tumor into the prevertebral and circumferential epidural space at the C5-6 level without significant spinal canal narrowing. -Tumor also extends to involve the left C5-6 and C6-7  transverse and neural foramen with partial encasement of the vertebral artery. -No evidence of spinal cord enhancement. MRI lumbar spine: - L3 metastatic lesion and pathologic fracture with approximately 20% height loss. There is extension of tumor into the prevertebral and anterior epidural space at the L3 level with mild spinal canal and bilateral neural foraminal narrowing. - Partially imaged 4.2 cm right lumbosacral junction/SI joint metastasis with extension of tumor into the right L4-5 neural foramen. Query associated pathologic fracture. - Please see concurrent MR sacrum for better characterization. These results will be called to the ordering clinician or representative by the Radiologist Assistant, and communication documented in the PACS or [employer]. Electronically Signed   By: Primitivo Gauze M.D.   On: 05/29/2020 14:49   MR SACRUM SI JOINTS W WO CONTRAST  Result Date: 05/30/2020 CLINICAL DATA:  Patient with a history of metastatic lung carcinoma. Onset low back pain approximately 1 month ago. Hypermetabolic  lesion in the right sacrum on PET scan 05/22/2020. EXAM: MRI SACRUM WITHOUT AND WITH CONTRAST TECHNIQUE: Multiplanar, multisequence MR imaging of the sacrum was performed both before and after administration of intravenous contrast. CONTRAST:  6 mL GADAVIST IV SOLN COMPARISON:  PET scan 05/22/2020. FINDINGS: Bones/Joint/Cartilage As seen on the prior PET scan, there is a destructive, enhancing mass lesion in the right sacrum measuring approximately 4.4 cm transverse by 2.5 cm AP by 4.7 cm craniocaudal. The right sacral component of the lesion extends into the inferior aspect of the right L5-S1 foramen and impinges on the right L5 root within and beyond the foramen. Abnormal signal and enhancement in the right ilium consistent with tumor immediately posterior to the soft tissue mass measures 3.1 cm transverse by 1.0 cm AP by 4.8 cm craniocaudal. Tumor does not appear to impact the right  S1 root. Ligaments Intact. Muscles and Tendons Intact and normal in appearance. Soft tissues Imaged intrapelvic contents are unremarkable. IMPRESSION: Metastatic disease in the right sacrum and posterior right ilium results in impingement on the right L5 root in the periphery of the foramen and beyond the foramen. Electronically Signed   By: Inge Rise M.D.   On: 05/30/2020 09:27   NM PET Image Initial (PI) Skull Base To Thigh  Result Date: 05/22/2020 CLINICAL DATA:  Initial treatment strategy for lung nodule, adrenal mass. EXAM: NUCLEAR MEDICINE PET SKULL BASE TO THIGH TECHNIQUE: 7.8 mCi F-18 FDG was injected intravenously. Full-ring PET imaging was performed from the skull base to thigh after the radiotracer. CT data was obtained and used for attenuation correction and anatomic localization. Fasting blood glucose: 94 mg/dl COMPARISON:  CT chest 05/16/2020. FINDINGS: Mediastinal blood pool activity: SUV max 2.0 Liver activity: SUV max NA NECK: There is focal hypermetabolism in the sellar region, SUV max 8.4, without a definite CT correlate. No hypermetabolic lymph nodes. Incidental CT findings: None. CHEST: Spiculated nodule in the apical segment right upper lobe measures 1.9 x 2.2 cm with an SUV max of 10.9. There is abnormal hypermetabolism associated with apical segmental bronchial wall thickening and mucoid impaction (3/79, 84 and 87), SUV max 3.8. Hypermetabolic right hilar lymph node measures 7 mm (3/95) with an SUV max of 4.0. Right paratracheal lymph node measures 5 mm (3/81) with an SUV max of 3.1. AP window lymph node measures 6 mm (3/81) with an SUV max 4.9. Focal left hilar metabolism has an SUV max of 3.3 and corresponds to a 5 mm lymph node, better seen on contrast infused CT chest 05/16/2020. New peribronchovascular ground-glass in the superior segment left lower lobe (3/97) has an SUV max 2.8. Mild mid esophageal long segment hypermetabolism, without a definite CT correlate. Incidental CT  findings: Atherosclerotic calcification of the aorta and coronary arteries. Heart size normal. No pericardial or pleural effusion. Centrilobular and paraseptal emphysema. ABDOMEN/PELVIS: Mildly hyperattenuating right adrenal mass measures 1.5 x 2.5 cm with an SUV max 28.6. There is focal hypermetabolism in the left adrenal gland, SUV max 5.6, without a definite CT correlate. No abnormal hypermetabolism in the liver, spleen, pancreas or lymph nodes. Incidental CT findings: Liver, gallbladder, kidneys, spleen, pancreas, stomach and bowel are otherwise unremarkable. Atherosclerotic calcification of the aorta without aneurysm. Slight bladder wall thickening. Prostate is normal in size. SKELETON: There are multiple hypermetabolic lytic lesions in the cervical spine, right scapula, posterior left tenth rib, L3, right sacrum and right pubic bone. Index lesion in the right sacral ala measures 2.9 x 3.4 cm (3/194) with an SUV  max of 41.3. Incidental CT findings: None. IMPRESSION: 1. 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 2. Focal hypermetabolism in the sellar region, without a definite CT correlate. Consider MR brain without and with contrast in further evaluation, as clinically indicated. 3. New peribronchovascular ground-glass in the superior segment left lower lobe, likely infectious or inflammatory in etiology. 4. Aortic atherosclerosis (ICD10-I70.0). Coronary artery calcification. 5.  Emphysema (ICD10-J43.9). Electronically Signed   By: Lorin Picket M.D.   On: 05/22/2020 11:11   CT Biopsy  Result Date: 05/27/2020 INDICATION: 64 year old with a suspicious right lung nodule and suspect metastatic lung disease. Patient has a lucent bone lesion in the right sacral ala. Tissue diagnosis is needed. EXAM: CT-GUIDED CORE  BIOPSY OF RIGHT SACRAL BONE LESION Physician: Stephan Minister. Anselm Pancoast, MD MEDICATIONS: None. ANESTHESIA/SEDATION: Fentanyl 50 mcg IV; Versed 1.0 mg IV Moderate Sedation Time:  19 minutes The patient was continuously monitored during the procedure by the interventional radiology nurse under my direct supervision. COMPLICATIONS: None immediate. PROCEDURE: The procedure was explained to the patient. The risks and benefits of the procedure were discussed and the patient's questions were addressed. Informed consent was obtained from the patient. Time-out was performed. Patient was placed supine on CT scanner. CT images through the lower abdomen were obtained. Right side of the abdomen was prepped with chlorhexidine and sterile field was created. Skin was anesthetized with 1% lidocaine. Small skin incision was made. Using CT guidance, a 17 gauge coaxial needle was directed into the right sacral lesion. Needle was positioned along the lateral aspect of the lesion in order to avoid the nerve. Multiple core biopsies were obtained with an 18 gauge core device. Specimens placed in formalin. 17 gauge needle was removed without complication. Bandage placed over the puncture site. FINDINGS: Destructive lucent lesion involving the right sacral ala. Biopsy needle was confirmed within the lesion. Core specimens obtained. IMPRESSION: CT-guided core biopsies of the right sacral bone lesion. Electronically Signed   By: Markus Daft M.D.   On: 05/27/2020 12:56   DG ESOPHAGUS W DOUBLE CM (HD)  Result Date: 05/17/2020 CLINICAL DATA:  Odynophagia.  Difficulty swallowing. EXAM: ESOPHOGRAM / BARIUM SWALLOW / BARIUM TABLET STUDY TECHNIQUE: Combined double contrast and single contrast examination performed using effervescent crystals, thick barium liquid, and thin barium liquid. The patient was observed with fluoroscopy swallowing a 13 mm barium sulphate tablet. FLUOROSCOPY TIME:  Fluoroscopy Time:  1 minutes 6 seconds Radiation Exposure Index (if  provided by the fluoroscopic device): 3.7 mGy Number of Acquired Spot Images: 0 COMPARISON:  None. FINDINGS: Limited evaluation secondary to patient motion resulting from pain. Normal pharyngeal anatomy and motility. Contrast flowed freely through the esophagus without evidence of a stricture or mass. Mild mucosal thickening and irregularity without ulceration. Esophageal motility was normal. Mild gastroesophageal reflux. No definite hiatal hernia was demonstrated. At the end of the examination a 13 mm barium tablet was administered which transited through the esophagus and esophagogastric junction without delay. IMPRESSION: 1. Mild mucosal thickening and irregularity without ulceration as can be seen with mild esophagitis. 2. Mild gastroesophageal reflux. 3. No esophageal stricture. Electronically Signed   By: Kathreen Devoid   On: 05/17/2020 08:32    Assessment and Plan:   Douglas [name] is a 64 y.o. y/o male ***    Lucilla Lame, MD. Marval Regal    Note: This dictation was  prepared with Dragon dictation along with smaller phrase technology. Any transcriptional errors that result from this process are unintentional.

## 2020-05-31 ENCOUNTER — Other Ambulatory Visit: Payer: Self-pay | Admitting: Anatomic Pathology & Clinical Pathology

## 2020-05-31 LAB — SURGICAL PATHOLOGY

## 2020-05-31 NOTE — Progress Notes (Signed)
  Please call patient.  He did not go to his GI appointment.  M

## 2020-06-04 ENCOUNTER — Ambulatory Visit
Admission: RE | Admit: 2020-06-04 | Discharge: 2020-06-04 | Disposition: A | Discharge status: Home / Self Care | Payer: Medicare Other | Source: Ambulatory Visit | Attending: Radiation Oncology | Admitting: Radiation Oncology

## 2020-06-04 ENCOUNTER — Encounter: Payer: Self-pay | Admitting: Radiation Oncology

## 2020-06-04 ENCOUNTER — Other Ambulatory Visit: Payer: Self-pay

## 2020-06-04 ENCOUNTER — Inpatient Hospital Stay: Payer: Medicare Other

## 2020-06-04 VITALS — BP 166/100 | HR 111 | Resp 20 | Wt 139.6 lb

## 2020-06-04 DIAGNOSIS — R634 Abnormal weight loss: Secondary | ICD-10-CM | POA: Diagnosis not present

## 2020-06-04 DIAGNOSIS — J449 Chronic obstructive pulmonary disease, unspecified: Secondary | ICD-10-CM | POA: Diagnosis not present

## 2020-06-04 DIAGNOSIS — F17210 Nicotine dependence, cigarettes, uncomplicated: Secondary | ICD-10-CM | POA: Diagnosis not present

## 2020-06-04 DIAGNOSIS — C7951 Secondary malignant neoplasm of bone: Secondary | ICD-10-CM | POA: Insufficient documentation

## 2020-06-04 DIAGNOSIS — C7971 Secondary malignant neoplasm of right adrenal gland: Secondary | ICD-10-CM | POA: Diagnosis not present

## 2020-06-04 DIAGNOSIS — I10 Essential (primary) hypertension: Secondary | ICD-10-CM | POA: Insufficient documentation

## 2020-06-04 DIAGNOSIS — R1310 Dysphagia, unspecified: Secondary | ICD-10-CM

## 2020-06-04 DIAGNOSIS — C801 Malignant (primary) neoplasm, unspecified: Secondary | ICD-10-CM | POA: Insufficient documentation

## 2020-06-04 DIAGNOSIS — E785 Hyperlipidemia, unspecified: Secondary | ICD-10-CM | POA: Diagnosis not present

## 2020-06-04 DIAGNOSIS — Z79899 Other long term (current) drug therapy: Secondary | ICD-10-CM | POA: Diagnosis not present

## 2020-06-04 DIAGNOSIS — R918 Other nonspecific abnormal finding of lung field: Secondary | ICD-10-CM

## 2020-06-04 DIAGNOSIS — M545 Low back pain: Secondary | ICD-10-CM | POA: Insufficient documentation

## 2020-06-04 DIAGNOSIS — E278 Other specified disorders of adrenal gland: Secondary | ICD-10-CM

## 2020-06-04 DIAGNOSIS — C7931 Secondary malignant neoplasm of brain: Secondary | ICD-10-CM | POA: Diagnosis not present

## 2020-06-04 NOTE — Consult Note (Signed)
NEW PATIENT EVALUATION  Name: Douglas [name]  MRN: [id number]  Date:   06/04/2020     DOB: 1955/12/30   This 64 y.o. male patient presents to the clinic for initial evaluation of patient with stage IV cancer.  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 Cancer    initial consultation    DIAGNOSIS: The encounter diagnosis was Bone metastasis ([place]).   PREVIOUS INVESTIGATIONS:  Pathology report reviewed Imaging reviewed including multiple MRI scans of sacrum lumbar spine cervical spine and brain as well as PET CT scan Clinical notes reviewed  HPI: Patient is a 64 year old male who presented with a right upper lobe lung mass.  He had weight loss significant neck and lower back pain.  PET scan showed a 2.2 cm right upper lobe hypermetabolic lesion with bilateral mediastinal nodes bilateral adrenal lesions consistent with metastatic disease as well as multiple lytic bone lesions most prominent in the cervical lumbar and pelvic region.  He is currently on narcotic analgesics for his pain although he states it is not completely covering his pain threshold at this time.  He underwent CT-guided biopsy of his right sacral alla on 816 showing adenocarcinoma of unknown primary.  Head MRI on 818 showed sub-5 mm bilateral cerebral enhancing foci concerning for brain metastasis.  MRI of his cervical spine showed involvement of the C5 vertebral body with tumor extending to the left C5-C6 and C6-C7 levels.  Lumbar spine MRI also showed L3 metastatic lesion with pathologic fracture.  He also has lesion in the SI joint on the right measuring 4.4 x 4.7 cm with enhancing mass in the right sacrum with impingement on the right L5 root in the periphery.  Currently he is seen today for consideration of palliative radiation therapy.  He is in significant pain still again centered  around his lower back and cervical spine region.  PLANNED TREATMENT REGIMEN: Palliative radiation therapy to cervical and lumbar spine and SI joint  PAST MEDICAL HISTORY:  has a past medical history of COPD (chronic obstructive pulmonary disease) ([place]), Hyperlipidemia, Hypertension, Lesion of adrenal gland ([place]), and Lung mass.    PAST SURGICAL HISTORY: History reviewed. No pertinent surgical history.  FAMILY HISTORY: family history is not on file.  SOCIAL HISTORY:  reports that he has been smoking cigarettes. He started smoking about 45 years ago. He has a 22.50 pack-year smoking history. He has never used smokeless tobacco. He reports current alcohol use.  Drug: Marijuana.  ALLERGIES: Patient has no known allergies.  MEDICATIONS:  Current Outpatient Medications  Medication Sig Dispense Refill  . albuterol (VENTOLIN HFA) 108 (90 Base) MCG/ACT inhaler Inhale 1-2 puffs into the lungs every 6 (six) hours as needed for wheezing or shortness of breath. 6.7 g 0  . albuterol (VENTOLIN HFA) 108 (90 Base) MCG/ACT inhaler Inhale 2 puffs into the lungs every 6 (six) hours as needed for wheezing or shortness of breath. 18 g 1  . Budeson-Glycopyrrol-Formoterol (BREZTRI AEROSPHERE) 160-9-4.8 MCG/ACT AERO Inhale 2 puffs into the lungs in the morning and at bedtime. 2 g 0  . gabapentin (NEURONTIN) 300 MG capsule Take 300 mg by mouth 2 (two) times daily.    Marland Kitchen losartan-hydrochlorothiazide (HYZAAR) 50-12.5 MG tablet Take 1 tablet by mouth daily.    . mometasone-formoterol (DULERA) 200-5 MCG/ACT AERO Inhale 2 puffs into the lungs 2 (two) times daily. 8.8 g 1  .  oxyCODONE (OXY IR/ROXICODONE) 5 MG immediate release tablet Take 1 tablet (5 mg total) by mouth every 4 (four) hours as needed for severe pain. 50 tablet 0  . oxyCODONE (OXYCONTIN) 10 mg 12 hr tablet Take 1 tablet (10 mg total) by mouth every 12 (twelve) hours. 30 tablet 0  . pantoprazole (PROTONIX) 40 MG tablet Take 1 tablet (40 mg total) by mouth  daily. 90 tablet 0  . predniSONE (STERAPRED UNI-PAK 21 TAB) 10 MG (21) TBPK tablet Take as directed in package.  This is a taper pack. 1 each 0  . LORazepam (ATIVAN) 0.5 MG tablet Take 1 tablet (0.5 mg total) by mouth once as needed for up to 1 dose for anxiety (prior to MRI). (Patient not taking: Reported on 05/30/2020) 1 tablet 0  . naloxone (NARCAN) nasal spray 4 mg/0.1 mL 1 spray into nostril upon signs of opioid overdose. Call 911. May repeat once if no response within 2-3 minutes. (Patient not taking: Reported on 05/30/2020) 1 each 0  . rosuvastatin (CRESTOR) 10 MG tablet Take 10 mg by mouth daily. (Patient not taking: Reported on 05/28/2020)    . sucralfate (CARAFATE) 1 g tablet Take 1 tablet (1 g total) by mouth 4 (four) times daily -  with meals and at bedtime for 7 days. 28 tablet 0  . tamsulosin (FLOMAX) 0.4 MG CAPS capsule Take 0.4 mg by mouth every evening. (Patient not taking: Reported on 05/28/2020)     No current facility-administered medications for this encounter.    ECOG PERFORMANCE STATUS:  1 - Symptomatic but completely ambulatory  REVIEW OF SYSTEMS: Patient denies any weight loss, fatigue, weakness, fever, chills or night sweats. Patient denies any loss of vision, blurred vision. Patient denies any ringing  of the ears or hearing loss. No irregular heartbeat. Patient denies heart murmur or history of fainting. Patient denies any chest pain or pain radiating to her upper extremities. Patient denies any shortness of breath, difficulty breathing at night, cough or hemoptysis. Patient denies any swelling in the lower legs. Patient denies any nausea vomiting, vomiting of blood, or coffee ground material in the vomitus. Patient denies any stomach pain. Patient states has had normal bowel movements no significant constipation or diarrhea. Patient denies any dysuria, hematuria or significant nocturia. Patient denies any problems walking, swelling in the joints or loss of balance. Patient  denies any skin changes, loss of hair or loss of weight. Patient denies any excessive worrying or anxiety or significant depression. Patient denies any problems with insomnia. Patient denies excessive thirst, polyuria, polydipsia. Patient denies any swollen glands, patient denies easy bruising or easy bleeding. Patient denies any recent infections, allergies or URI. Patient "s visual fields have not changed significantly in recent time.   PHYSICAL EXAM: BP (!) 166/100 (BP Location: Left Arm, Patient Position: Sitting)   Pulse (!) 111   Resp 20   Wt 139 lb 9.6 oz (63.3 kg)   SpO2 97%   BMI 23.23 kg/m  Patient has significant pain on range of motion of his neck.  Motor or sensory and DTR levels are equal symmetric in the upper and lower extremities.  He does have also pain on deep palpation of his right sacral region.  Well-developed well-nourished patient in NAD. HEENT reveals PERLA, EOMI, discs not visualized.  Oral cavity is clear. No oral mucosal lesions are identified. Neck is clear without evidence of cervical or supraclavicular adenopathy. Lungs are clear to A&P. Cardiac examination is essentially unremarkable with regular rate and  rhythm without murmur rub or thrill. Abdomen is benign with no organomegaly or masses noted. Motor sensory and DTR levels are equal and symmetric in the upper and lower extremities. Cranial nerves II through XII are grossly intact. Proprioception is intact. No peripheral adenopathy or edema is identified. No motor or sensory levels are noted. Crude visual fields are within normal range.  LABORATORY DATA: Pathology report reviewed    RADIOLOGY RESULTS: All MRI scans PET scan and CT scans reviewed compatible with above-stated findings   IMPRESSION: Most likely stage IV lung cancer with brain bone involvement as well as adrenal metastasis in 64 year old male  PLAN: At this time I can start with palliative radiation therapy to his cervical spine lumbar spine and SI  joint region.  I would treat both areas to 3000 cGy in 10 fractions.  Risks and benefits of treatment including skin reaction fatigue alteration of blood counts possible radiation esophagitis possible chance of diarrhea all were described in detail to the patient.  At this time based on the small lesions in his brain with very little edema would hold off therapy until there is any sign for palliation of his progressive neurologic symptoms.  He may be on immunotherapy which may cross the blood-brain barrier and treat these lesions.  We will discuss that treatment in the future with Dr. Mike Gip.  Patient comprehends my recommendations well I have set him up on emergent basis tomorrow for simulation and will try to start his treatments as soon as possible.  I would like to take this opportunity to thank you for allowing me to participate in the care of your patient.Noreene Filbert, MD

## 2020-06-05 ENCOUNTER — Ambulatory Visit
Admission: RE | Admit: 2020-06-05 | Discharge: 2020-06-05 | Disposition: A | Discharge status: Home / Self Care | Payer: Medicare Other | Source: Ambulatory Visit | Attending: Radiation Oncology | Admitting: Radiation Oncology

## 2020-06-05 DIAGNOSIS — C7931 Secondary malignant neoplasm of brain: Secondary | ICD-10-CM | POA: Insufficient documentation

## 2020-06-05 DIAGNOSIS — C7971 Secondary malignant neoplasm of right adrenal gland: Secondary | ICD-10-CM | POA: Insufficient documentation

## 2020-06-05 DIAGNOSIS — C7951 Secondary malignant neoplasm of bone: Secondary | ICD-10-CM | POA: Diagnosis not present

## 2020-06-05 DIAGNOSIS — C7972 Secondary malignant neoplasm of left adrenal gland: Secondary | ICD-10-CM | POA: Diagnosis not present

## 2020-06-05 DIAGNOSIS — C801 Malignant (primary) neoplasm, unspecified: Secondary | ICD-10-CM | POA: Insufficient documentation

## 2020-06-05 LAB — CEA: CEA: 207 ng/mL — ABNORMAL HIGH (ref 0.0–4.7)

## 2020-06-05 LAB — CANCER ANTIGEN 19-9: CA 19-9: 26 U/mL (ref 0–35)

## 2020-06-06 ENCOUNTER — Other Ambulatory Visit: Payer: Medicare Other

## 2020-06-06 ENCOUNTER — Ambulatory Visit: Payer: Medicare Other | Admitting: Gastroenterology

## 2020-06-06 ENCOUNTER — Telehealth: Payer: Self-pay

## 2020-06-06 DIAGNOSIS — C7951 Secondary malignant neoplasm of bone: Secondary | ICD-10-CM | POA: Diagnosis not present

## 2020-06-06 NOTE — Telephone Encounter (Signed)
I spoke with Sherrie the patient daughter, which she states she is home sick with COVID -69 and her brother was supposed to be taking Mr Douglas [name] to his appointment today.She told me she will call me back and informed me whats going on with his appointment.

## 2020-06-07 ENCOUNTER — Inpatient Hospital Stay (HOSPITAL_BASED_OUTPATIENT_CLINIC_OR_DEPARTMENT_OTHER): Payer: Medicare Other | Admitting: Hospice and Palliative Medicine

## 2020-06-07 DIAGNOSIS — Z515 Encounter for palliative care: Secondary | ICD-10-CM

## 2020-06-07 DIAGNOSIS — G893 Neoplasm related pain (acute) (chronic): Secondary | ICD-10-CM

## 2020-06-07 MED ORDER — OXYCODONE HCL 5 MG PO TABS: 5.0000 mg | ORAL_TABLET | ORAL | 0 refills | Status: AC | PRN

## 2020-06-07 MED ORDER — OXYCODONE HCL ER 10 MG PO T12A: 10.0000 mg | EXTENDED_RELEASE_TABLET | Freq: Three times a day (TID) | ORAL | 0 refills | Status: DC

## 2020-06-07 NOTE — Progress Notes (Addendum)
Tumor Board Documentation  Douglas [name] was presented by Dr Mike Gip at our Tumor Board on 06/06/2020, which included representatives from medical oncology, radiation oncology, surgical oncology, internal medicine, navigation, pathology, radiology, surgical, research, pulmonology.  Douglas [name] currently presents as a new patient, for [place], for new positive pathology with history of the following treatments: active survellience, surgical intervention(s).  Additionally, we reviewed previous medical and familial history, history of present illness, and recent lab results along with all available histopathologic and imaging studies. The tumor board considered available treatment options and made the following recommendations: Chemotherapy, Immunotherapy Refer to Radiation Oncology for Brain Metastasis GI evaluation to assess upper tract.  The following procedures/referrals were also placed: No orders of the defined types were placed in this encounter.   Clinical Trial Status: not discussed   Staging used: AJCC Stage Group  AJCC Staging:     M: 1 Group: Stage IV Metastatic Adenocarcinoma compatable with Lung origin   National site-specific guidelines NCCN were discussed with respect to the case.  Tumor board is a meeting of clinicians from various specialty areas who evaluate and discuss patients for whom a multidisciplinary approach is being considered. Final determinations in the plan of care are those of the provider(s). The responsibility for follow up of recommendations given during tumor board is that of the provider.   Today's extended care, comprehensive team conference, Douglas [name] was not present for the discussion and was not examined.   Multidisciplinary Tumor Board is a multidisciplinary case peer review process.  Decisions discussed in the Multidisciplinary Tumor Board reflect the opinions of the specialists present at the conference without having examined the patient.  Ultimately,  treatment and diagnostic decisions rest with the primary provider(s) and the patient.

## 2020-06-07 NOTE — Progress Notes (Signed)
Virtual Visit via Telephone Note  I connected with Douglas [name] on 06/07/20 at 11:30 AM EDT by telephone and verified that I am speaking with the correct person using two identifiers.   I discussed the limitations, risks, security and privacy concerns of performing an evaluation and management service by telephone and the availability of in person appointments. I also discussed with the patient that there may be a patient responsible charge related to this service. The patient expressed understanding and agreed to proceed.   History of Present Illness: Douglas [name] is a 64 y.o. male with multiple medical problems including COPD, tobacco abuse, hypertension, hyperlipidemia, who was hospitalized 05/16/2020-05/17/2020 with dysphagia and unintentional weight loss. Patient was found to have a right lung mass, right adrenal lesion, and diffuse circumferential esophageal thickening on CT. Barium swallow on 05/17/2020 revealed reflux and esophagitis. 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 Patient has been severely symptomatic with pain, weight loss, and poor appetite. He is referred to palliative care to help address goals and manage ongoing symptoms.   Observations/Objective: -Unable to reach patient for MyChart visit.  I called and spoke with his daughter and son by phone.  Also spoke with patient by phone.  Patient reports he is doing reasonably well.  He continues to endorse persistent odynophagia but says it is overall improved from his baseline.  His swallowing is also improved.  He is still requiring oxycodone every 4 hours around-the-clock.  He says there are times when he has to take 2 oxycodone tablets to manage his pain.  He feels like the OxyContin has had end dose failure and feels like it is  wearing off too soon.  Assessment and Plan: Stage IV lung cancer -pending XRT.  Followed by Dr. Mike Gip.  Neoplasm related pain -we will increase OxyContin 10 mg every 8 hours (#60) and oxycodone 5 to 10 mg every 4 hours as needed for breakthrough pain (#60)  Opioid-induced constipation -discussed bowel regimen with recommend MiraLAX daily with use of Senokot as needed  Follow Up Instructions: MyChart visit in 2-3 weeks   I discussed the assessment and treatment plan with the patient. The patient was provided an opportunity to ask questions and all were answered. The patient agreed with the plan and demonstrated an understanding of the instructions.   The patient was advised to call back or seek an in-person evaluation if the symptoms worsen or if the condition fails to improve as anticipated.  I provided 15 minutes of non-face-to-face time during this encounter.   Irean Hong, NP

## 2020-06-07 NOTE — Progress Notes (Signed)
Patient states that he having hip pain on level 9. Patient also stated that he does not have an appetite.Patients also states that he having shortness of breathe.   Please call [phone]

## 2020-06-08 ENCOUNTER — Other Ambulatory Visit: Payer: Self-pay

## 2020-06-08 ENCOUNTER — Inpatient Hospital Stay (HOSPITAL_COMMUNITY)
Admission: EM | Admit: 2020-06-08 | Discharge: 2020-06-12 | DRG: 177 | Disposition: A | Discharge status: Home Health | Payer: Medicare Other | Attending: Internal Medicine | Admitting: Internal Medicine

## 2020-06-08 ENCOUNTER — Emergency Department (HOSPITAL_COMMUNITY): Payer: Medicare Other

## 2020-06-08 ENCOUNTER — Encounter (HOSPITAL_COMMUNITY): Payer: Self-pay

## 2020-06-08 DIAGNOSIS — E785 Hyperlipidemia, unspecified: Secondary | ICD-10-CM | POA: Diagnosis present

## 2020-06-08 DIAGNOSIS — Z79899 Other long term (current) drug therapy: Secondary | ICD-10-CM | POA: Diagnosis not present

## 2020-06-08 DIAGNOSIS — C7951 Secondary malignant neoplasm of bone: Secondary | ICD-10-CM | POA: Diagnosis present

## 2020-06-08 DIAGNOSIS — Z7951 Long term (current) use of inhaled steroids: Secondary | ICD-10-CM | POA: Diagnosis not present

## 2020-06-08 DIAGNOSIS — J449 Chronic obstructive pulmonary disease, unspecified: Secondary | ICD-10-CM | POA: Diagnosis present

## 2020-06-08 DIAGNOSIS — J1282 Pneumonia due to coronavirus disease 2019: Secondary | ICD-10-CM | POA: Diagnosis present

## 2020-06-08 DIAGNOSIS — J440 Chronic obstructive pulmonary disease with acute lower respiratory infection: Secondary | ICD-10-CM | POA: Diagnosis present

## 2020-06-08 DIAGNOSIS — U071 COVID-19: Secondary | ICD-10-CM | POA: Diagnosis present

## 2020-06-08 DIAGNOSIS — R6520 Severe sepsis without septic shock: Secondary | ICD-10-CM | POA: Diagnosis not present

## 2020-06-08 DIAGNOSIS — J441 Chronic obstructive pulmonary disease with (acute) exacerbation: Secondary | ICD-10-CM | POA: Diagnosis present

## 2020-06-08 DIAGNOSIS — C3411 Malignant neoplasm of upper lobe, right bronchus or lung: Secondary | ICD-10-CM | POA: Diagnosis present

## 2020-06-08 DIAGNOSIS — F17210 Nicotine dependence, cigarettes, uncomplicated: Secondary | ICD-10-CM | POA: Diagnosis present

## 2020-06-08 DIAGNOSIS — R0602 Shortness of breath: Secondary | ICD-10-CM

## 2020-06-08 DIAGNOSIS — I10 Essential (primary) hypertension: Secondary | ICD-10-CM | POA: Diagnosis present

## 2020-06-08 DIAGNOSIS — J9621 Acute and chronic respiratory failure with hypoxia: Secondary | ICD-10-CM | POA: Diagnosis present

## 2020-06-08 DIAGNOSIS — C3410 Malignant neoplasm of upper lobe, unspecified bronchus or lung: Secondary | ICD-10-CM | POA: Diagnosis not present

## 2020-06-08 DIAGNOSIS — A419 Sepsis, unspecified organism: Secondary | ICD-10-CM | POA: Diagnosis not present

## 2020-06-08 DIAGNOSIS — Z9981 Dependence on supplemental oxygen: Secondary | ICD-10-CM | POA: Diagnosis not present

## 2020-06-08 DIAGNOSIS — K219 Gastro-esophageal reflux disease without esophagitis: Secondary | ICD-10-CM | POA: Diagnosis present

## 2020-06-08 DIAGNOSIS — J410 Simple chronic bronchitis: Secondary | ICD-10-CM | POA: Diagnosis not present

## 2020-06-08 DIAGNOSIS — C3490 Malignant neoplasm of unspecified part of unspecified bronchus or lung: Secondary | ICD-10-CM | POA: Diagnosis present

## 2020-06-08 DIAGNOSIS — E7800 Pure hypercholesterolemia, unspecified: Secondary | ICD-10-CM | POA: Diagnosis not present

## 2020-06-08 LAB — BRAIN NATRIURETIC PEPTIDE: B Natriuretic Peptide: 79.2 pg/mL (ref 0.0–100.0)

## 2020-06-08 LAB — COMPREHENSIVE METABOLIC PANEL
ALT: 25 U/L (ref 0–44)
AST: 87 U/L — ABNORMAL HIGH (ref 15–41)
Albumin: 2.8 g/dL — ABNORMAL LOW (ref 3.5–5.0)
Alkaline Phosphatase: 66 U/L (ref 38–126)
Anion gap: 12 (ref 5–15)
BUN: 24 mg/dL — ABNORMAL HIGH (ref 8–23)
CO2: 31 mmol/L (ref 22–32)
Calcium: 8 mg/dL — ABNORMAL LOW (ref 8.9–10.3)
Chloride: 91 mmol/L — ABNORMAL LOW (ref 98–111)
Creatinine, Ser: 1.14 mg/dL (ref 0.61–1.24)
GFR calc Af Amer: >60 mL/min (ref >60)
GFR calc non Af Amer: >60 mL/min (ref >60)
Glucose, Bld: 107 mg/dL — ABNORMAL HIGH (ref 70–99)
Potassium: 4.2 mmol/L (ref 3.5–5.1)
Sodium: 134 mmol/L — ABNORMAL LOW (ref 135–145)
Total Bilirubin: 0.7 mg/dL (ref 0.3–1.2)
Total Protein: 6.6 g/dL (ref 6.5–8.1)

## 2020-06-08 LAB — CBC WITH DIFFERENTIAL/PLATELET
Abs Immature Granulocytes: 0.12 10*3/uL — ABNORMAL HIGH (ref 0.00–0.07)
Basophils Absolute: 0 10*3/uL (ref 0.0–0.1)
Basophils Relative: 0 %
Eosinophils Absolute: 0 10*3/uL (ref 0.0–0.5)
Eosinophils Relative: 0 %
HCT: 29.5 % — ABNORMAL LOW (ref 39.0–52.0)
Hemoglobin: 9.4 g/dL — ABNORMAL LOW (ref 13.0–17.0)
Immature Granulocytes: 1 %
Lymphocytes Relative: 5 %
Lymphs Abs: 0.8 10*3/uL (ref 0.7–4.0)
MCH: 29.7 pg (ref 26.0–34.0)
MCHC: 31.9 g/dL (ref 30.0–36.0)
MCV: 93.4 fL (ref 80.0–100.0)
Monocytes Absolute: 0.7 10*3/uL (ref 0.1–1.0)
Monocytes Relative: 4 %
Neutro Abs: 15.1 10*3/uL — ABNORMAL HIGH (ref 1.7–7.7)
Neutrophils Relative %: 90 %
Platelets: 236 10*3/uL (ref 150–400)
RBC: 3.16 MIL/uL — ABNORMAL LOW (ref 4.22–5.81)
RDW: 14 % (ref 11.5–15.5)
WBC: 16.7 10*3/uL — ABNORMAL HIGH (ref 4.0–10.5)
nRBC: 0.1 % (ref 0.0–0.2)

## 2020-06-08 LAB — LACTIC ACID, PLASMA: Lactic Acid, Venous: 1.1 mmol/L (ref 0.5–1.9)

## 2020-06-08 LAB — MAGNESIUM: Magnesium: 2.8 mg/dL — ABNORMAL HIGH (ref 1.7–2.4)

## 2020-06-08 LAB — SARS CORONAVIRUS 2 BY RT PCR (HOSPITAL ORDER, PERFORMED IN ~~LOC~~ HOSPITAL LAB): SARS Coronavirus 2: POSITIVE — AB

## 2020-06-08 MED ORDER — ACETAMINOPHEN 650 MG RE SUPP: 650.0000 mg | Freq: Four times a day (QID) | RECTAL | Status: DC | PRN

## 2020-06-08 MED ORDER — OXYCODONE HCL 5 MG PO TABS: 5.0000 mg | ORAL_TABLET | Freq: Four times a day (QID) | ORAL | Status: DC | PRN

## 2020-06-08 MED ORDER — ALBUTEROL SULFATE HFA 108 (90 BASE) MCG/ACT IN AERS
1.0000 | INHALATION_SPRAY | RESPIRATORY_TRACT | Status: DC | PRN
  Administered 2020-06-09: 2 via RESPIRATORY_TRACT

## 2020-06-08 MED ORDER — IPRATROPIUM BROMIDE HFA 17 MCG/ACT IN AERS
2.0000 | INHALATION_SPRAY | Freq: Once | RESPIRATORY_TRACT | Status: AC
  Administered 2020-06-08: 2 via RESPIRATORY_TRACT

## 2020-06-08 MED ORDER — SODIUM CHLORIDE 0.9 % IV SOLN
100.0000 mg | Freq: Every day | INTRAVENOUS | Status: AC
  Administered 2020-06-09 – 2020-06-12 (×4): 100 mg via INTRAVENOUS
  Filled 2020-06-08 (×4): qty 20

## 2020-06-08 MED ORDER — PANTOPRAZOLE SODIUM 40 MG PO TBEC
40.0000 mg | DELAYED_RELEASE_TABLET | Freq: Every day | ORAL | Status: DC
  Administered 2020-06-09 – 2020-06-11 (×3): 40 mg via ORAL
  Filled 2020-06-08 (×4): qty 1

## 2020-06-08 MED ORDER — METHYLPREDNISOLONE SODIUM SUCC 125 MG IJ SOLR
125.0000 mg | Freq: Once | INTRAMUSCULAR | Status: AC
  Administered 2020-06-08: 125 mg via INTRAVENOUS
  Filled 2020-06-08: qty 2

## 2020-06-08 MED ORDER — ALBUTEROL SULFATE HFA 108 (90 BASE) MCG/ACT IN AERS
8.0000 | INHALATION_SPRAY | Freq: Once | RESPIRATORY_TRACT | Status: AC
  Administered 2020-06-08: 8 via RESPIRATORY_TRACT
  Filled 2020-06-08: qty 6.7

## 2020-06-08 MED ORDER — NICOTINE 14 MG/24HR TD PT24
14.0000 mg | MEDICATED_PATCH | Freq: Every day | TRANSDERMAL | Status: DC | PRN
  Administered 2020-06-12: 14 mg via TRANSDERMAL
  Filled 2020-06-08: qty 1

## 2020-06-08 MED ORDER — DEXAMETHASONE 4 MG PO TABS
6.0000 mg | ORAL_TABLET | Freq: Every day | ORAL | Status: DC
  Administered 2020-06-09: 6 mg via ORAL
  Filled 2020-06-08 (×2): qty 1

## 2020-06-08 MED ORDER — METHYLPREDNISOLONE SODIUM SUCC 40 MG IJ SOLR: 40.0000 mg | Freq: Four times a day (QID) | INTRAMUSCULAR | Status: DC

## 2020-06-08 MED ORDER — SODIUM CHLORIDE 0.9 % IV SOLN
500.0000 mg | Freq: Every day | INTRAVENOUS | Status: DC
  Administered 2020-06-09: 500 mg via INTRAVENOUS
  Filled 2020-06-08: qty 500

## 2020-06-08 MED ORDER — SODIUM CHLORIDE 0.9% FLUSH: 3.0000 mL | Freq: Two times a day (BID) | INTRAVENOUS | Status: DC

## 2020-06-08 MED ORDER — GABAPENTIN 300 MG PO CAPS
300.0000 mg | ORAL_CAPSULE | Freq: Two times a day (BID) | ORAL | Status: DC
  Administered 2020-06-09 – 2020-06-12 (×7): 300 mg via ORAL
  Filled 2020-06-08 (×7): qty 1

## 2020-06-08 MED ORDER — MAGNESIUM SULFATE 2 GM/50ML IV SOLN
2.0000 g | Freq: Once | INTRAVENOUS | Status: AC
  Administered 2020-06-08: 2 g via INTRAVENOUS
  Filled 2020-06-08: qty 50

## 2020-06-08 MED ORDER — ACETAMINOPHEN 325 MG PO TABS
650.0000 mg | ORAL_TABLET | Freq: Four times a day (QID) | ORAL | Status: DC | PRN
  Administered 2020-06-11: 650 mg via ORAL
  Filled 2020-06-08: qty 2

## 2020-06-08 MED ORDER — SODIUM CHLORIDE 0.9 % IV SOLN
200.0000 mg | Freq: Once | INTRAVENOUS | Status: AC
  Administered 2020-06-08: 200 mg via INTRAVENOUS
  Filled 2020-06-08: qty 200

## 2020-06-08 MED ORDER — NALOXONE HCL 0.4 MG/ML IJ SOLN: 0.4000 mg | INTRAMUSCULAR | Status: DC | PRN

## 2020-06-08 MED ORDER — IPRATROPIUM BROMIDE HFA 17 MCG/ACT IN AERS
2.0000 | INHALATION_SPRAY | Freq: Once | RESPIRATORY_TRACT | Status: AC
  Administered 2020-06-08: 2 via RESPIRATORY_TRACT
  Filled 2020-06-08: qty 12.9

## 2020-06-08 MED ORDER — IPRATROPIUM-ALBUTEROL 20-100 MCG/ACT IN AERS
1.0000 | INHALATION_SPRAY | Freq: Four times a day (QID) | RESPIRATORY_TRACT | Status: DC
  Administered 2020-06-09 – 2020-06-12 (×14): 1 via RESPIRATORY_TRACT
  Filled 2020-06-08: qty 4

## 2020-06-08 MED ORDER — ENOXAPARIN SODIUM 40 MG/0.4ML ~~LOC~~ SOLN
40.0000 mg | SUBCUTANEOUS | Status: DC
  Administered 2020-06-09 – 2020-06-12 (×4): 40 mg via SUBCUTANEOUS
  Filled 2020-06-08 (×4): qty 0.4

## 2020-06-08 NOTE — ED Triage Notes (Signed)
Patient arrives from a hotel, his family tested positive for COVID so he has been staying in a hotel instead of home. Lung CA dx about 1 month ago. Unvaccinated against COVID-19. EMS found him at 71% RA, on a NRB and satting around 92%.

## 2020-06-08 NOTE — H&P (Signed)
History and Physical    PLEASE NOTE THAT DRAGON DICTATION SOFTWARE WAS USED IN THE CONSTRUCTION OF THIS NOTE.   OSKAR CRETELLA NIO:[id number] DOB: 06/12/1956 DOA: 06/08/2020  PCP: Patient, No Pcp Per Patient coming from: home   I have personally briefly reviewed patient's old medical records in [place]  Chief Complaint: Shortness of breath  HPI: Douglas [name] is a 64 y.o. male with medical history significant for severe COPD, chronic hypoxic respiratory failure on 2 L continuous nasal cannula, hypertension, recently diagnosed lung cancer, who is admitted to [facility] on 06/08/2020 with acute on chronic hypoxic respiratory failure in the setting of severe COVID-19 infection after presenting from home to [facility] Emergency Department complaining of shortness of breath.   The patient reports 2 days of progressive shortness of breath associated with fatigue and generalized weakness.  He also reports associated wheezing in the absence of any cough.  Denies any associated orthopnea, PND, or peripheral edema.  Denies any new onset extremity erythema or calf tenderness.  Denies any recent trauma or traveling.  Not associate with any chest pain, diaphoresis, or palpitations.    Denies any associated subjective fever, chills, rigors, or generalized myalgias.  Denies any recent headache, neck stiffness, rhinitis, rhinorrhea, sore throat, nausea, vomiting, abdominal pain, diarrhea, or rash.  Denies any associated dysuria, gross hematuria, or change in urinary urgency/frequency.  Approximately 1 week ago, the patient reports that his daughter, with whom he has had interval contact, was diagnosed with COVID-19.  He has been attempting to isolate himself from his daughter given this result, but notes that he was in contact with her after she became symptomatic but before the COVID-19 test result.   He aknowledges a history of underlying COPD as well as chronic supplemental  oxygen demands, which he believes to be 2 L/min continuous.  Reports good compliance with his outpatient respiratory regimen, but reports interval progression of shortness of breath in spite of additional doses of as needed albuterol inhaler as an outpatient.  Denies any known underlying history of hypertension, obesity, diabetes, or heart failure.   Of note, the patient reports that he has not yet established with oncology in setting of recent diagnosis of lung cancer.    ED Course:  Vital signs in the ED were notable for the following: Temperature max 99.0; heart rate 68-88; blood pressure ranged from 112/75-140 6/80; respiratory rate initially 34, which improved 18-22 following interval administration of Combivent inhaler x2, IV magnesium, and Solu-Medrol as further described below; initial oxygen saturation noted to be in the mid 70s on baseline 2 L continuous nasal cannula, which is improved to 97% on 8 L nonrebreather.  Labs were notable for the following: CMP notable for the following: Sodium 134, bicarbonate 31, creatinine 1.14, alk phos 66, AST 87, ALT 25, and total bilirubin 0.5.  CBC notable for white blood cell count of 16,700, hemoglobin 9.4 relative to most recent prior value of 11.4 on 05/17/2020.  Lactic acid as well as general inflammatory markers have been ordered in the ED, with results currently pending.  BNP 79.  COVID-19 PCR performed in the ED today was found to be positive.  Chest x-ray showed no evidence of acute cardiopulmonary process, including no evidence of pneumothorax, electric, edema, or pleural effusion.  EKG showed sinus rhythm with multiple PACs, QTC 474 ms, and no evidence of T wave or ST changes.  While in the ED, the following were administered: Combivent inhaler x2,  slight Medrol 125 mg IV x1, magnesium sulfate 2 g IV every 2 hours x1.     Review of Systems: As per HPI otherwise 10 point review of systems negative.   Past Medical History:  Diagnosis Date  .  COPD (chronic obstructive pulmonary disease) ([place])   . Hyperlipidemia   . Hypertension   . Lesion of adrenal gland ([place])   . Lung mass     Past Surgical History:  Procedure Laterality Date  . LUNG BIOPSY  August '21    Social History:  reports that he has been smoking cigarettes. He started smoking about 45 years ago. He has a 22.50 pack-year smoking history. He has never used smokeless tobacco. He reports current alcohol use.  Drug: Marijuana.   No Known Allergies  Family History  Problem Relation Age of Onset  . Cancer Neg Hx      Prior to Admission medications   Medication Sig Start Date End Date Taking? Authorizing Provider  albuterol (VENTOLIN HFA) 108 (90 Base) MCG/ACT inhaler Inhale 1-2 puffs into the lungs every 6 (six) hours as needed for wheezing or shortness of breath. 05/16/20   [name], MD  albuterol (VENTOLIN HFA) 108 (90 Base) MCG/ACT inhaler Inhale 2 puffs into the lungs every 6 (six) hours as needed for wheezing or shortness of breath. 05/17/20   [name], MD  Budeson-Glycopyrrol-Formoterol (BREZTRI AEROSPHERE) 160-9-4.8 MCG/ACT AERO Inhale 2 puffs into the lungs in the morning and at bedtime. 05/28/20   Tyler Pita, MD  gabapentin (NEURONTIN) 300 MG capsule Take 300 mg by mouth 2 (two) times daily. 05/02/20   [provider]  LORazepam (ATIVAN) 0.5 MG tablet Take 1 tablet (0.5 mg total) by mouth once as needed for up to 1 dose for anxiety (prior to MRI). Patient not taking: Reported on 05/30/2020 05/28/20   [name], MD  losartan-hydrochlorothiazide (HYZAAR) 50-12.5 MG tablet Take 1 tablet by mouth daily. 04/30/20   [provider]  mometasone-formoterol (DULERA) 200-5 MCG/ACT AERO Inhale 2 puffs into the lungs 2 (two) times daily. 05/17/20   [name], MD  naloxone [facility]) nasal spray 4 mg/0.1 mL 1 spray into nostril upon signs of opioid overdose. Call 911. May repeat once if no response within 2-3 minutes. Patient not  taking: Reported on 05/30/2020 05/24/20   Borders, [name], NP  oxyCODONE (OXY IR/ROXICODONE) 5 MG immediate release tablet Take 1-2 tablets (5-10 mg total) by mouth every 4 (four) hours as needed for severe pain. 06/07/20   Borders, [name], NP  oxyCODONE (OXYCONTIN) 10 mg 12 hr tablet Take 1 tablet (10 mg total) by mouth every 8 (eight) hours. 06/07/20   Borders, [name], NP  pantoprazole (PROTONIX) 40 MG tablet Take 1 tablet (40 mg total) by mouth daily. 05/17/20 08/15/20  [name], MD  predniSONE (STERAPRED UNI-PAK 21 TAB) 10 MG (21) TBPK tablet Take as directed in package.  This is a taper pack. 05/28/20   Tyler Pita, MD  rosuvastatin (CRESTOR) 10 MG tablet Take 10 mg by mouth daily. Patient not taking: Reported on 05/28/2020 03/13/20   [provider]  sucralfate (CARAFATE) 1 g tablet Take 1 tablet (1 g total) by mouth 4 (four) times daily -  with meals and at bedtime for 7 days. 05/24/20 05/31/20  Borders, [name], NP  tamsulosin (FLOMAX) 0.4 MG CAPS capsule Take 0.4 mg by mouth every evening. Patient not taking: Reported on 05/28/2020 04/01/20   [provider]     Objective    Physical Exam: Vitals:   06/08/20 1756 06/08/20 1828 06/08/20 1839 06/08/20 2030  BP: (!) 142/89  (!) 146/80 139/70  Pulse: 88  90 68  Resp: (!) 34  (!) 22 19  Temp: 99 F (37.2 C)     TempSrc: Axillary     SpO2: 97% 91% 92% 94%    General: appears to be stated age; alert, oriented; increased wob noted; Skin: warm, dry, no rash Head:  AT/[place] Mouth:  Oral mucosa membranes appear moist, normal dentition Neck: supple; trachea midline Heart:  RRR; did not appreciate any M/R/G Lungs: mild wheezing in all lung field bilaterally; did not appreciate any rales or rhonchi Abdomen: + BS; soft, ND, NT Vascular: 2+ pedal pulses b/l; 2+ radial pulses b/l Extremities: no peripheral edema, no muscle wasting Neuro: strength and sensation intact in upper and lower extremities b/l   Labs on  Admission: I have personally reviewed following labs and imaging studies  CBC: Recent Labs  Lab 06/08/20 1831  WBC 16.7*  NEUTROABS 15.1*  HGB 9.4*  HCT 29.5*  MCV 93.4  PLT 425   Basic Metabolic Panel: Recent Labs  Lab 06/08/20 1831  NA 134*  K 4.2  CL 91*  CO2 31  GLUCOSE 107*  BUN 24*  CREATININE 1.14  CALCIUM 8.0*   GFR: Estimated Creatinine Clearance: 56.2 mL/min (by C-G formula based on SCr of 1.14 mg/dL). Liver Function Tests: Recent Labs  Lab 06/08/20 1831  AST 87*  ALT 25  ALKPHOS 66  BILITOT 0.7  PROT 6.6  ALBUMIN 2.8*   No results for input(s): LIPASE, AMYLASE in the last 168 hours. No results for input(s): AMMONIA in the last 168 hours. Coagulation Profile: No results for input(s): INR, PROTIME in the last 168 hours. Cardiac Enzymes: No results for input(s): CKTOTAL, CKMB, CKMBINDEX, TROPONINI in the last 168 hours. BNP (last 3 results) No results for input(s): PROBNP in the last 8760 hours. HbA1C: No results for input(s): HGBA1C in the last 72 hours. CBG: No results for input(s): GLUCAP in the last 168 hours. Lipid Profile: No results for input(s): CHOL, HDL, LDLCALC, TRIG, CHOLHDL, LDLDIRECT in the last 72 hours. Thyroid Function Tests: No results for input(s): TSH, T4TOTAL, FREET4, T3FREE, THYROIDAB in the last 72 hours. Anemia Panel: No results for input(s): VITAMINB12, FOLATE, FERRITIN, TIBC, IRON, RETICCTPCT in the last 72 hours. Urine analysis: No results found for: COLORURINE, APPEARANCEUR, LABSPEC, PHURINE, GLUCOSEU, HGBUR, BILIRUBINUR, KETONESUR, PROTEINUR, UROBILINOGEN, NITRITE, LEUKOCYTESUR  Radiological Exams on Admission: DG Chest Port 1 View  Result Date: 06/08/2020 CLINICAL DATA:  Dyspnea. Recent diagnosis of lung cancer. Family positive for COVID-19. EXAM: PORTABLE CHEST 1 VIEW COMPARISON:  Radiograph and CT 05/16/2020.  PET CT 05/22/2020. FINDINGS: The lungs are hyperinflated. Stable heart size and mediastinal contours. No  convincing acute airspace disease. The known spiculated nodule at the right lung apex is only faintly visualized. No pneumothorax or large pleural effusion. Known osseous metastasis on PET not well seen by radiograph. IMPRESSION: 1. No acute findings. 2. Chronic hyperinflation. The known spiculated nodule at the right lung apex is only faintly visualized radiographically. Electronically Signed   By: Keith Rake M.D.   On: 06/08/2020 18:43     EKG: Independently reviewed, with result as described above.    Assessment/Plan   LLIAM HOH is a 64 y.o. male with medical history significant for severe COPD, chronic hypoxic respiratory failure on 2 L continuous nasal cannula, hypertension,  recently diagnosed lung cancer, who is admitted to [facility] on 06/08/2020 with acute on chronic hypoxic respiratory failure in the setting of severe COVID-19 infection after presenting from home to [facility] Emergency Department complaining of shortness of breath.    Principal Problem:   COVID-19 virus infection Active Problems:   Hypertension   Acute on chronic respiratory failure with hypoxia (HCC)   Severe sepsis (HCC)   COPD with acute exacerbation (HCC)   SOB (shortness of breath)    #) Severe COVID-19 infection: diagnosis on the basis of two days of progressive sob a/w with acute on chronic hypoxic respiratory failure and positive COVID-19 PCR performed in the ED this evening. Of note, general inflammatory markers ordered, with these labs results pending at this time. In setting of acute hypoxia, criteria are met from patient's COVID-19 infection to be considered severe in nature. Consequently, there is a Grade 2c rec for dexamethasone, which is further supported by treatment guidance recommendations from [facility]'s Covid Treatment Committee.  Additionally, criteria met for initiation of remdesivir on the basis of symptomatic COVID-19 infection requiring hospitalization per  treatment guidance recommendations from [facility]'s Covid Treatment Committee. of note, ALT noted to be less than 220. Therefore, there is no contraindication for initiation of remdesivir on the basis of transaminitis. Of note, this patient does not possess any of the four co-morbidities classically associated with increased risk for complicated clinical course relating to presenting COVID-19 infection.  However the patient has a known history of severe COPD.  Presentation is associated with recent known COVID-19 exposure with patient's daughter, as described above.    Plan: Airborne and contact precautions with eye protection. Monitor continuous pulse oximetry. prn supplemental O2 to maintain O2 sats greater than or equal to 92%. Proning protocol initiated. monitor on telemetry. PRN albuterol inhaler.  Scheduled Combivent inhaler in the setting of suspected related acute COPD exacerbation, as further described below.  PRN acetaminophen for fever. Start dexamethasone and remdesivir, as above.  Will monitor for results of general inflammatory lab studies ordered in the ED this evening, and repeat these markers with the morning labs. Check procalcitonin. Check serum magnesium and phosphorus levels.  Repeat CMP and CBC in the morning.  Check ABG  for the purpose of evaluating PaO2 to FiO2 ratio. Assess patient's COVID-19 vaccination status.      #) Severe sepsis: Appears to be on the basis of COVID-19, as above, with SIRS criteria met via presenting tachypnea and leukocytosis.  Criteria are met for the patient sepsis to be considered severe in nature on the basis of concomitant presenting acute on chronic hypoxic respiratory failure.  Lactic lactic acid level ordered in the ED, with result currently pending.  No evidence of associated hypotension thus far.  No evidence of additional underlying infectious process beyond COVID-19, including CXR showing no evidence of bacterial pneumonia. UA currently pending.  As patient's sepsis is felt to be viral in nature, will refrain from antibiotic coverage at this time, although Azithromycin will be started purely for duration of hospitalization benefits a/w concominant presenting acute COPD exacerbation.   Plan: Work-up and management of COVID-19 infection, as above. Check blood cultures x2.  Repeat CBC with differential in the morning.  Check urinalysis. PRN acetaminophen for fever. Will follow for result of lactic acid.  Check procalcitonin.      #) Acute on chronic hypoxic respiratory failure: In the setting of severe COPD and recently diagnosed lung cancer, the patient reports a baseline  supplemental oxygen requirement of 2 L continuous nasal cannula, upon which his initial oxygen saturation upon presenting to the ED this evening was noted to be in the mid 70s, with ensuing improvement in O2 sat to 97% on 8 L nonrebreather.  Suspect that this acute decompensation relative to baseline supplemental oxygen requirement is on the basis of concomitant presenting severe COVID-19 infection, as further described above as well as potential additional complication from ensuing acute COPD exacerbation.  Of note, presenting chest x-ray showed no evidence of acute cardiopulmonary process, as further described above.  Additionally, ACS is felt to be less likely in the absence of any associated chest discomfort, while presenting EKG shows sinus rhythm without evidence of acute ischemic changes.  Plan: Evaluation and management of presenting severe COVID-19 infection, as further described above.  Monitor continuous pulse oximetry.  Monitor on telemetry.  Additional work-up and management of suspected acute COPD exacerbation, as further described low, including scheduled Combivent inhalers, as needed albuterol inhaler, and systemic steroids.  Check ABG.  Add on serum magnesium level and check serum phosphorus level.      #) Acute COPD exacerbation: In the setting of known severe  COPD on 2 L continuous nasal cannula at baseline, diagnosis on the basis of 2 days of progressive shortness of breath associated with new onset wheezing as well as acute on chronic hypoxic respiratory failure.  Suspect that this is as a result of severe COVID-19 infection, as above.  The patient confirms that he is a current smoker, having smoked approximately half pack per day over the last 40 to 50 years, although he acknowledges recent decrease in his daily smoking habits down to half pack per week.  Plan: Scheduled Combivent inhalers every 6 hours while awake, as needed albuterol inhaler, systemic steroids in the form of Decadron in the context of presenting COVID-19 infection, as above.  Start azithromycin for benefit of diminished duration of hospitalization in the context of acute COPD exacerbation.  Monitor continuous pulse oximetry as well as telemetry.  Add on serum magnesium level and check serum phosphorus level.  Check ABG.  Counseled the patient on the importance of complete smoking discontinuation.     #) Essential hypertension: Outpatient antihypertensive regimen consists of losartan/HCTZ.  Presenting blood pressures in the ED this evening noted to be normotensive.  However, in the context of presenting severe sepsis, will hold home antihypertensive agents for now.  Plan: Hold home antihypertensive medications for now, as above.  Close monitoring of ensuing blood pressure via routine vital signs.     #) Chronic tobacco abuse: The patient knowledges that he is a current smoker, having smoked approximately half pack per day over the last 40 to 50 years, with recent reduction down to about half pack per week.  Plan: Counseled the patient on the importance of complete smoking discontinuation, particular in the setting of underlying severe COPD.  I have ordered a as needed nicotine patch for use during this hospitalization.    DVT prophylaxis: Lovenox 40 mg subcu daily Code Status:  Full code Family Communication: none Disposition Plan: Per Rounding Team Consults called: none  Admission status: Inpatient; med telemetry.    PLEASE NOTE THAT DRAGON DICTATION SOFTWARE WAS USED IN THE CONSTRUCTION OF THIS NOTE.   Rhetta Mura DO [facility] Pager [phone] From [place]  06/08/2020, 9:32 PM

## 2020-06-08 NOTE — ED Provider Notes (Signed)
[facility] DEPT Provider Note   CSN: [id number] Arrival date & time: 06/08/20  1742     History Chief Complaint  Patient presents with  . hypoxia  . Shortness of Breath    [name] [name] is a 64 y.o. male.  HPI 64 year old male with COPD as well as recent diagnosis of lung cancer presents with shortness of breath.  Ongoing for about 2 days.  He has been staying in a hotel because family members have tested positive for the novel coronavirus.  He has not had any cough but is having shortness of breath.  He states this feels somewhat like COPD.  He wears chronic oxygen but EMS found his O2 sats to be in the 70s and he was placed on nonrebreather.  No fevers or chest pain.  No leg swelling.   Past Medical History:  Diagnosis Date  . COPD (chronic obstructive pulmonary disease) ([place])   . Hyperlipidemia   . Hypertension   . Lesion of adrenal gland ([place])   . Lung mass     Patient Active Problem List   Diagnosis Date Noted  . Cancer-related pain 05/27/2020  . Weight loss 05/27/2020  . Dysphagia 05/27/2020  . Bone metastasis ([place]) 05/27/2020  . Lytic bone lesions on xray 05/23/2020  . Goals of care, counseling/discussion 05/23/2020  . Adrenal mass ([place]) 05/18/2020  . Esophagitis 05/16/2020  . Mass of upper lobe of right lung 05/16/2020  . Hypertension   . Hyperlipidemia   . COPD (chronic obstructive pulmonary disease) ([place])     History reviewed. No pertinent surgical history.     Family History  Problem Relation Age of Onset  . Cancer Neg Hx     Social History   Tobacco Use  . Smoking status: Current Every Day Smoker    Packs/day: 0.50    Years: 45.00    Pack years: 22.50    Types: Cigarettes    Start date: 05/29/1975  . Smokeless tobacco: Never Used  . Tobacco comment: 1 pack/week  Vaping Use  . Vaping Use: Never used  Substance Use Topics  . Alcohol use: Yes    Comment: 2 beers per day  . Drug use: Not on file    Home  Medications Prior to Admission medications   Medication Sig Start Date End Date Taking? Authorizing Provider  albuterol (VENTOLIN HFA) 108 (90 Base) MCG/ACT inhaler Inhale 1-2 puffs into the lungs every 6 (six) hours as needed for wheezing or shortness of breath. 05/16/20  Yes [name], MD  Budeson-Glycopyrrol-Formoterol (BREZTRI AEROSPHERE) 160-9-4.8 MCG/ACT AERO Inhale 2 puffs into the lungs in the morning and at bedtime. 05/28/20  Yes Tyler Pita, MD  gabapentin (NEURONTIN) 300 MG capsule Take 300 mg by mouth 2 (two) times daily. 05/02/20  Yes [provider]  losartan-hydrochlorothiazide (HYZAAR) 50-12.5 MG tablet Take 1 tablet by mouth daily. 04/30/20  Yes [provider]  naloxone (NARCAN) nasal spray 4 mg/0.1 mL 1 spray into nostril upon signs of opioid overdose. Call 911. May repeat once if no response within 2-3 minutes. Patient taking differently: Place 0.4 mg into the nose daily as needed. 1 spray into nostril upon signs of opioid overdose. Call 911. May repeat once if no response within 2-3 minutes. 05/24/20  Yes Borders, [name], NP  oxyCODONE (OXY IR/ROXICODONE) 5 MG immediate release tablet Take 1-2 tablets (5-10 mg total) by mouth every 4 (four) hours as needed for severe pain. 06/07/20  Yes Borders, [name],  NP  oxyCODONE (OXYCONTIN) 10 mg 12 hr tablet Take 1 tablet (10 mg total) by mouth every 8 (eight) hours. 06/07/20  Yes Borders, [name], NP  pantoprazole (PROTONIX) 40 MG tablet Take 1 tablet (40 mg total) by mouth daily. 05/17/20 08/15/20 Yes [name], MD  sucralfate (CARAFATE) 1 g tablet Take 1 tablet (1 g total) by mouth 4 (four) times daily -  with meals and at bedtime for 7 days. 05/24/20 06/08/20 Yes Borders, [name], NP  albuterol (VENTOLIN HFA) 108 (90 Base) MCG/ACT inhaler Inhale 2 puffs into the lungs every 6 (six) hours as needed for wheezing or shortness of breath. Patient not taking: Reported on 06/08/2020 05/17/20   [name], MD    LORazepam (ATIVAN) 0.5 MG tablet Take 1 tablet (0.5 mg total) by mouth once as needed for up to 1 dose for anxiety (prior to MRI). Patient not taking: Reported on 05/30/2020 05/28/20   [name], MD  mometasone-formoterol [facility]) 200-5 MCG/ACT AERO Inhale 2 puffs into the lungs 2 (two) times daily. Patient not taking: Reported on 06/08/2020 05/17/20   [name], MD  predniSONE (STERAPRED UNI-PAK 21 TAB) 10 MG (21) TBPK tablet Take as directed in package.  This is a taper pack. Patient not taking: Reported on 06/08/2020 05/28/20   Tyler Pita, MD  rosuvastatin (CRESTOR) 10 MG tablet Take 10 mg by mouth daily. Patient not taking: Reported on 05/28/2020 03/13/20   [provider]  tamsulosin (FLOMAX) 0.4 MG CAPS capsule Take 0.4 mg by mouth every evening. Patient not taking: Reported on 05/28/2020 04/01/20   [provider]    Allergies    Patient has no known allergies.  Review of Systems   Review of Systems  Constitutional: Negative for fever.  Respiratory: Positive for shortness of breath. Negative for cough.   Cardiovascular: Negative for chest pain and leg swelling.  All other systems reviewed and are negative.   Physical Exam Updated Vital Signs BP 112/75   Pulse 60   Temp 99 F (37.2 C) (Axillary)   Resp 18   SpO2 97%   Physical Exam Vitals and nursing note reviewed.  Constitutional:      Appearance: He is well-developed. He is not diaphoretic.  HENT:     Head: Normocephalic and atraumatic.     Right Ear: External ear normal.     Left Ear: External ear normal.     Nose: Nose normal.  Eyes:     General:        Right eye: No discharge.        Left eye: No discharge.  Cardiovascular:     Rate and Rhythm: Normal rate and regular rhythm.     Heart sounds: Normal heart sounds.  Pulmonary:     Effort: Pulmonary effort is normal. Tachypnea present. No accessory muscle usage.     Breath sounds: Examination of the right-lower field reveals  decreased breath sounds. Examination of the left-lower field reveals decreased breath sounds. Decreased breath sounds and wheezing present.  Abdominal:     Palpations: Abdomen is soft.     Tenderness: There is no abdominal tenderness.  Musculoskeletal:     Cervical back: Neck supple.     Right lower leg: No edema.     Left lower leg: No edema.  Skin:    General: Skin is warm and dry.  Neurological:     Mental Status: He is alert.  Psychiatric:  Mood and Affect: Mood is not anxious.     ED Results / Procedures / Treatments   Labs (all labs ordered are listed, but only abnormal results are displayed) Labs Reviewed  SARS CORONAVIRUS 2 BY RT PCR ([place] [place] [facility]) - Abnormal; Notable for the following components:      Result Value   SARS Coronavirus 2 POSITIVE (*)    All other components within normal limits  COMPREHENSIVE METABOLIC PANEL - Abnormal; Notable for the following components:   Sodium 134 (*)    Chloride 91 (*)    Glucose, Bld 107 (*)    BUN 24 (*)    Calcium 8.0 (*)    Albumin 2.8 (*)    AST 87 (*)    All other components within normal limits  CBC WITH DIFFERENTIAL/PLATELET - Abnormal; Notable for the following components:   WBC 16.7 (*)    RBC 3.16 (*)    Hemoglobin 9.4 (*)    HCT 29.5 (*)    Neutro Abs 15.1 (*)    Abs Immature Granulocytes 0.12 (*)    All other components within normal limits  CULTURE, BLOOD (ROUTINE X 2)  CULTURE, BLOOD (ROUTINE X 2)  BRAIN NATRIURETIC PEPTIDE  LACTIC ACID, PLASMA  LACTIC ACID, PLASMA  D-DIMER, QUANTITATIVE (NOT AT [facility])  PROCALCITONIN  LACTATE DEHYDROGENASE  FERRITIN  TRIGLYCERIDES  FIBRINOGEN  C-REACTIVE PROTEIN    EKG EKG Interpretation  Date/Time:  Saturday June 08 2020 21:47:12 EDT Ventricular Rate:  75 PR Interval:    QRS Duration: 89 QT Interval:  424 QTC Calculation: 474 R Axis:   78 Text Interpretation: Sinus rhythm Atrial premature complexes similar to  earlier in the day Confirmed by Sherwood Gambler [phone]) on 06/08/2020 10:25:04 PM   Radiology DG Chest Port 1 View  Result Date: 06/08/2020 CLINICAL DATA:  Dyspnea. Recent diagnosis of lung cancer. Family positive for COVID-19. EXAM: PORTABLE CHEST 1 VIEW COMPARISON:  Radiograph and CT 05/16/2020.  PET CT 05/22/2020. FINDINGS: The lungs are hyperinflated. Stable heart size and mediastinal contours. No convincing acute airspace disease. The known spiculated nodule at the right lung apex is only faintly visualized. No pneumothorax or large pleural effusion. Known osseous metastasis on PET not well seen by radiograph. IMPRESSION: 1. No acute findings. 2. Chronic hyperinflation. The known spiculated nodule at the right lung apex is only faintly visualized radiographically. Electronically Signed   By: Keith Rake M.D.   On: 06/08/2020 18:43    Procedures .Critical Care Performed by: Sherwood Gambler, MD Authorized by: Sherwood Gambler, MD   Critical care provider statement:    Critical care time (minutes):  35   Critical care time was exclusive of:  Separately billable procedures and treating other patients   Critical care was necessary to treat or prevent imminent or life-threatening deterioration of the following conditions:  Respiratory failure   Critical care was time spent personally by me on the following activities:  Discussions with consultants, evaluation of patient's response to treatment, examination of patient, ordering and performing treatments and interventions, ordering and review of laboratory studies, ordering and review of radiographic studies, pulse oximetry, re-evaluation of patient's condition, obtaining history from patient or surrogate and review of old charts   (including critical care time)  Medications Ordered in ED Medications  remdesivir 200 mg in sodium chloride 0.9% 250 mL IVPB (has no administration in time range)    Followed by  remdesivir 100 mg in sodium  chloride 0.9 % 100  mL IVPB (has no administration in time range)  magnesium sulfate IVPB 2 g 50 mL (0 g Intravenous Stopped 06/08/20 2031)  methylPREDNISolone sodium succinate (SOLU-MEDROL) 125 mg/2 mL injection 125 mg (125 mg Intravenous Given 06/08/20 1838)  albuterol (VENTOLIN HFA) 108 (90 Base) MCG/ACT inhaler 8 puff (8 puffs Inhalation Given 06/08/20 1838)  ipratropium (ATROVENT HFA) inhaler 2 puff (2 puffs Inhalation Given 06/08/20 1838)  albuterol (VENTOLIN HFA) 108 (90 Base) MCG/ACT inhaler 8 puff (8 puffs Inhalation Given 06/08/20 2029)  ipratropium (ATROVENT HFA) inhaler 2 puff (2 puffs Inhalation Given 06/08/20 2029)    ED Course  I have reviewed the triage vital signs and the nursing notes.  Pertinent labs & imaging results that were available during my care of the patient were reviewed by me and considered in my medical decision making (see chart for details).    MDM Rules/Calculators/A&P                          Patient presents with acute dyspnea and acute hypoxia.  He did have good response to nonrebreather which was switched to salter nasal cannula and he is on 8 L.  He was given albuterol twice as he has some wheezing which has improved and his work of breathing has improved.  However he is also found to be Covid positive.  He was started on soluMedrol, magnesium, and remdesivir. He will need admission.  [facility] [name] was evaluated in Emergency Department on 06/08/2020 for the symptoms described in the history of present illness. He was evaluated in the context of the global COVID-19 pandemic, which necessitated consideration that the patient might be at risk for infection with the SARS-CoV-2 virus that causes COVID-19. Institutional protocols and algorithms that pertain to the evaluation of patients at risk for COVID-19 are in a state of rapid change based on information released by regulatory bodies including the CDC and federal and state organizations. These policies and  algorithms were followed during the patient's care in the ED.  Final Clinical Impression(s) / ED Diagnoses Final diagnoses:  Acute on chronic respiratory failure with hypoxia ([place])  COVID-19 virus infection    Rx / DC Orders ED Discharge Orders    None       Sherwood Gambler, MD 06/08/20 2232

## 2020-06-08 NOTE — ED Notes (Signed)
Respiratory @ bedside

## 2020-06-09 DIAGNOSIS — C3410 Malignant neoplasm of upper lobe, unspecified bronchus or lung: Secondary | ICD-10-CM

## 2020-06-09 DIAGNOSIS — E7800 Pure hypercholesterolemia, unspecified: Secondary | ICD-10-CM

## 2020-06-09 DIAGNOSIS — J410 Simple chronic bronchitis: Secondary | ICD-10-CM

## 2020-06-09 DIAGNOSIS — C7931 Secondary malignant neoplasm of brain: Secondary | ICD-10-CM | POA: Diagnosis present

## 2020-06-09 DIAGNOSIS — J9621 Acute and chronic respiratory failure with hypoxia: Secondary | ICD-10-CM

## 2020-06-09 DIAGNOSIS — U071 COVID-19: Principal | ICD-10-CM

## 2020-06-09 LAB — URINALYSIS, ROUTINE W REFLEX MICROSCOPIC
Bacteria, UA: NONE SEEN
Bilirubin Urine: NEGATIVE
Glucose, UA: NEGATIVE mg/dL
Ketones, ur: NEGATIVE mg/dL
Leukocytes,Ua: NEGATIVE
Nitrite: NEGATIVE
Protein, ur: NEGATIVE mg/dL
Specific Gravity, Urine: 1.009 (ref 1.005–1.030)
pH: 5 (ref 5.0–8.0)

## 2020-06-09 LAB — CBC WITH DIFFERENTIAL/PLATELET
Abs Immature Granulocytes: 0.12 10*3/uL — ABNORMAL HIGH (ref 0.00–0.07)
Basophils Absolute: 0 10*3/uL (ref 0.0–0.1)
Basophils Relative: 0 %
Eosinophils Absolute: 0 10*3/uL (ref 0.0–0.5)
Eosinophils Relative: 0 %
HCT: 30.3 % — ABNORMAL LOW (ref 39.0–52.0)
Hemoglobin: 9.6 g/dL — ABNORMAL LOW (ref 13.0–17.0)
Immature Granulocytes: 1 %
Lymphocytes Relative: 5 %
Lymphs Abs: 0.7 10*3/uL (ref 0.7–4.0)
MCH: 29.7 pg (ref 26.0–34.0)
MCHC: 31.7 g/dL (ref 30.0–36.0)
MCV: 93.8 fL (ref 80.0–100.0)
Monocytes Absolute: 0.3 10*3/uL (ref 0.1–1.0)
Monocytes Relative: 2 %
Neutro Abs: 12.2 10*3/uL — ABNORMAL HIGH (ref 1.7–7.7)
Neutrophils Relative %: 92 %
Platelets: 209 10*3/uL (ref 150–400)
RBC: 3.23 MIL/uL — ABNORMAL LOW (ref 4.22–5.81)
RDW: 14 % (ref 11.5–15.5)
WBC: 13.3 10*3/uL — ABNORMAL HIGH (ref 4.0–10.5)
nRBC: 0 % (ref 0.0–0.2)

## 2020-06-09 LAB — COMPREHENSIVE METABOLIC PANEL
ALT: 25 U/L (ref 0–44)
AST: 66 U/L — ABNORMAL HIGH (ref 15–41)
Albumin: 2.8 g/dL — ABNORMAL LOW (ref 3.5–5.0)
Alkaline Phosphatase: 64 U/L (ref 38–126)
Anion gap: 14 (ref 5–15)
BUN: 24 mg/dL — ABNORMAL HIGH (ref 8–23)
CO2: 31 mmol/L (ref 22–32)
Calcium: 8.3 mg/dL — ABNORMAL LOW (ref 8.9–10.3)
Chloride: 93 mmol/L — ABNORMAL LOW (ref 98–111)
Creatinine, Ser: 1.05 mg/dL (ref 0.61–1.24)
GFR calc Af Amer: >60 mL/min (ref >60)
GFR calc non Af Amer: >60 mL/min (ref >60)
Glucose, Bld: 140 mg/dL — ABNORMAL HIGH (ref 70–99)
Potassium: 3.7 mmol/L (ref 3.5–5.1)
Sodium: 138 mmol/L (ref 135–145)
Total Bilirubin: 0.6 mg/dL (ref 0.3–1.2)
Total Protein: 6.3 g/dL — ABNORMAL LOW (ref 6.5–8.1)

## 2020-06-09 LAB — FERRITIN
Ferritin: 269 ng/mL (ref 24–336)
Ferritin: 260 ng/mL (ref 24–336)

## 2020-06-09 LAB — FIBRINOGEN
Fibrinogen: 642 mg/dL — ABNORMAL HIGH (ref 210–475)
Fibrinogen: 686 mg/dL — ABNORMAL HIGH (ref 210–475)

## 2020-06-09 LAB — BLOOD GAS, ARTERIAL
Acid-Base Excess: 6.7 mmol/L — ABNORMAL HIGH (ref 0.0–2.0)
Bicarbonate: 30.9 mmol/L — ABNORMAL HIGH (ref 20.0–28.0)
Drawn by: 11249
O2 Content: 10 L/min
O2 Saturation: 98.1 %
Patient temperature: 97.3
pCO2 arterial: 42.5 mmHg (ref 32.0–48.0)
pH, Arterial: 7.472 — ABNORMAL HIGH (ref 7.350–7.450)
pO2, Arterial: 114 mmHg — ABNORMAL HIGH (ref 83.0–108.0)

## 2020-06-09 LAB — PROCALCITONIN: Procalcitonin: 2.29 ng/mL

## 2020-06-09 LAB — TRIGLYCERIDES: Triglycerides: 102 mg/dL (ref <150)

## 2020-06-09 LAB — LACTATE DEHYDROGENASE
LDH: 319 U/L — ABNORMAL HIGH (ref 98–192)
LDH: 286 U/L — ABNORMAL HIGH (ref 98–192)

## 2020-06-09 LAB — C-REACTIVE PROTEIN
CRP: 30.9 mg/dL — ABNORMAL HIGH (ref <1.0)
CRP: 29.6 mg/dL — ABNORMAL HIGH (ref <1.0)

## 2020-06-09 LAB — PHOSPHORUS: Phosphorus: 3.5 mg/dL (ref 2.5–4.6)

## 2020-06-09 LAB — D-DIMER, QUANTITATIVE
D-Dimer, Quant: 1.11 ug/mL-FEU — ABNORMAL HIGH (ref 0.00–0.50)
D-Dimer, Quant: 1.07 ug/mL-FEU — ABNORMAL HIGH (ref 0.00–0.50)

## 2020-06-09 LAB — MAGNESIUM: Magnesium: 2.4 mg/dL (ref 1.7–2.4)

## 2020-06-09 MED ORDER — METHYLPREDNISOLONE SODIUM SUCC 40 MG IJ SOLR
40.0000 mg | Freq: Three times a day (TID) | INTRAMUSCULAR | Status: DC
  Administered 2020-06-09 – 2020-06-12 (×9): 40 mg via INTRAVENOUS
  Filled 2020-06-09 (×9): qty 1

## 2020-06-09 MED ORDER — ROSUVASTATIN CALCIUM 10 MG PO TABS
10.0000 mg | ORAL_TABLET | Freq: Every day | ORAL | Status: DC
  Administered 2020-06-09 – 2020-06-12 (×4): 10 mg via ORAL
  Filled 2020-06-09 (×4): qty 1

## 2020-06-09 MED ORDER — BARICITINIB 2 MG PO TABS
4.0000 mg | ORAL_TABLET | Freq: Every day | ORAL | Status: DC
  Administered 2020-06-09 – 2020-06-12 (×4): 4 mg via ORAL
  Filled 2020-06-09 (×4): qty 2

## 2020-06-09 MED ORDER — HYDRALAZINE HCL 20 MG/ML IJ SOLN: 10.0000 mg | Freq: Four times a day (QID) | INTRAMUSCULAR | Status: DC | PRN

## 2020-06-09 MED ORDER — AZITHROMYCIN 250 MG PO TABS
500.0000 mg | ORAL_TABLET | Freq: Every day | ORAL | Status: DC
  Administered 2020-06-09 – 2020-06-11 (×3): 500 mg via ORAL
  Filled 2020-06-09 (×3): qty 2

## 2020-06-09 MED ORDER — OXYCODONE HCL 5 MG PO TABS: 5.0000 mg | ORAL_TABLET | Freq: Four times a day (QID) | ORAL | Status: DC | PRN

## 2020-06-09 NOTE — ED Notes (Signed)
Attempted to call report; no answer yet, will try again soon.

## 2020-06-09 NOTE — Progress Notes (Signed)
PHARMACIST - PHYSICIAN COMMUNICATION  CONCERNING: Antibiotic IV to Oral Route Change Policy  RECOMMENDATION: This patient is receiving azithromycin by the intravenous route.  Based on criteria approved by the Pharmacy and Therapeutics Committee, the antibiotic(s) is/are being converted to the equivalent oral dose form(s).   DESCRIPTION: These criteria include:  Patient being treated for a respiratory tract infection, urinary tract infection, cellulitis or clostridium difficile associated diarrhea if on metronidazole  The patient is not neutropenic and does not exhibit a GI malabsorption state  The patient is eating (either orally or via tube) and/or has been taking other orally administered medications for a least 24 hours  The patient is improving clinically and has a Tmax < 100.5  If you have questions about this conversion, please contact the Pharmacy Department  []  ( [phone] )  [place] []  ( [phone]  [facility] []  ( [phone]  [place] []  ( [phone] )  [facility] [x]  ( [phone] )  [facility]  

## 2020-06-09 NOTE — Progress Notes (Signed)
[facility]                                                                              Patient Demographics  Douglas [name], is a 64 y.o. male, DOB - November 03, 1955, MAU:[id number]  Admit date - 06/08/2020   Admitting Physician Rhetta Mura, DO  Outpatient Primary MD for the patient is Patient, No Pcp Per  Outpatient specialists:   LOS - 1  days   Medical records reviewed and are as summarized below:    Chief Complaint  Patient presents with  . hypoxia  . Shortness of Breath       Brief summary   Patient is a 64 year old male with history of severe COPD, chronic respiratory failure on 2 L O2 via [place], continuous, hypertension, tobacco abuse, recently diagnosed lung CA presented to ED with shortness of breath.  Patient reported 2 days of progressive shortness of breath, fatigue, generalized weakness, associated wheezing.  No orthopnea, PND or peripheral edema.  No chest pain.  Denied any subjective fevers or chills, generalized myalgias.  Approximately 1 week ago patient reported that his daughter was diagnosed with COVID-19, he has been isolating since then in a hotel. EMS found his O2 sats to be in 70s, placed on nonrebreather. In ED, COVID-19 positive, BNP 79 Patient was placed on IV steroids, remdesivir, admitted for further work-up  Assessment & Plan    Principal Problem:    Acute hypoxic respiratory failure due to acute COVID-19 viral pneumonia during the ongoing COVID-19 pandemic- POA - Patient presented with progressively worsening shortness of breath, hypoxia.  Chest x-ray showed chronic hyperinflation, spiculated nodule at the right lung apex - Currently hypoxic, requiring 10 L nasal cannula via NRB  -Placed on IV Solu-Medrol 40 mg every 8 hours, remdesivir, day #2, started on baricitinib (CRP 29.6)  - Continue Supportive care: vitamin C/zinc, albuterol, Tylenol. - Continue to wean oxygen, ambulatory O2 screening daily as tolerated -If hypoxia  worsening, rule out PE (high risk due to malignancy and Covid)  - Oxygen - SpO2: 98 % O2 Flow Rate (L/min): 10 L/min - Continue to follow labs as below  Lab Results  Component Value Date   SARSCOV2NAA POSITIVE (A) 06/08/2020   [place] NEGATIVE 05/16/2020     Recent Labs  Lab 06/08/20 1831 06/08/20 2121 06/09/20 0441  DDIMER  --  1.11* 1.07*  FERRITIN  --  269 260  CRP  --  30.9* 29.6*  ALT 25  --  25  PROCALCITON  --  2.29  --      Active Problems: Right lung CA, right adrenal lesion, stage IV bronchogenic CA -Patient was admitted 05/16/2020 to 05/17/2020 with dysphagia and unintentional weight loss.  Work-up revealed right lung mass discerning for malignancy.  PET/CT on 8/11 showed right upper lobe mass with bilateral mediastinal, hilar lymph nodes, bilateral adrenal lesions, lytic osseous lesions concerning for stage IV primary bronchogenic carcinoma.   - Followed by Dr. Mike Gip at [facility], pending XRT. -Patient had a televisit with his oncologist on 8/27, for pain, patient was placed on OxyContin 10 mg every 8 hours and oxycodone 5 to 10 mg  every 4 hours as needed for breakthrough pain, bowel regimen by his oncologist on 8/27  GERD -Placed on PPI    Hypertension -BP currently soft, 119/70, hold off on losartan/HCTZ -Placed on IV hydralazine as needed with parameters    Hyperlipidemia -Continue Crestor    Code Status: Full CODE STATUS DVT Prophylaxis:  Lovenox Family Communication: Discussed all imaging results, lab results, explained to the patient and patient's daughter on the phone   Disposition Plan:     Status is: Inpatient  Remains inpatient appropriate because:Inpatient level of care appropriate due to severity of illness   Dispo: The patient is from: Home              Anticipated d/c is to: Home              Anticipated d/c date is: > 3 days              Patient currently is medically stable to d/c.      Time Spent in minutes   35  minutes  Procedures:  None  Consultants:   None  Antimicrobials:   Anti-infectives (From admission, onward)   Start     Dose/Rate Route Frequency Ordered Stop   06/09/20 1000  remdesivir 100 mg in sodium chloride 0.9 % 100 mL IVPB       "Followed by" Linked Group Details   100 mg 200 mL/hr over 30 Minutes Intravenous Daily 06/08/20 2135 06/13/20 0959   06/09/20 0200  azithromycin (ZITHROMAX) 500 mg in sodium chloride 0.9 % 250 mL IVPB        500 mg 250 mL/hr over 60 Minutes Intravenous Daily at bedtime 06/08/20 2307     06/08/20 2200  remdesivir 200 mg in sodium chloride 0.9% 250 mL IVPB       "Followed by" Linked Group Details   200 mg 580 mL/hr over 30 Minutes Intravenous Once 06/08/20 2135 06/09/20 0011         Medications  Scheduled Meds: . dexamethasone  6 mg Oral Daily  . enoxaparin (LOVENOX) injection  40 mg Subcutaneous Q24H  . gabapentin  300 mg Oral BID  . Ipratropium-Albuterol  1 puff Inhalation Q6H WA  . pantoprazole  40 mg Oral Daily   Continuous Infusions: . azithromycin Stopped (06/09/20 0609)  . remdesivir 100 mg in NS 100 mL 100 mg (06/09/20 0903)   PRN Meds:.acetaminophen **OR** acetaminophen, albuterol, naLOXone (NARCAN)  injection, nicotine, oxyCODONE      Subjective:   Douglas [name] was seen and examined today.  At the time of my examination, on 10 L O2 via NRB.  Appeared to be comfortable not in any distress.  Mild wheezing bilaterally.  Patient denies dizziness, abdominal pain, N/V/D/C, new weakness, numbess, tingling.   Objective:   Vitals:   06/09/20 0600 06/09/20 0615 06/09/20 0645 06/09/20 0851  BP: (!) 151/80 (!) 146/75 127/77 (!) 141/91  Pulse: 67 91 66 68  Resp: (!) 30 (!) 24 19 (!) 27  Temp:      TempSrc:      SpO2: 100% 94% 94% 98%    Intake/Output Summary (Last 24 hours) at 06/09/2020 1043 Last data filed at 06/09/2020 0011 Gross per 24 hour  Intake 300 ml  Output --  Net 300 ml     Wt Readings from Last 3  Encounters:  06/04/20 63.3 kg  05/30/20 63.3 kg  05/28/20 63 kg     Exam  General: Alert and oriented x 3, on NRB  Cardiovascular: S1 S2 auscultated, no murmurs, RRR  Respiratory: Bilateral scattered wheezing  Gastrointestinal: Soft, nontender, nondistended, + bowel sounds  Ext: no pedal edema bilaterally  Neuro: no new deficits  Musculoskeletal: No digital cyanosis, clubbing  Skin: No rashes  Psych: Normal affect and demeanor, alert and oriented x3    Data Reviewed:  I have personally reviewed following labs and imaging studies  Micro Results Recent Results (from the past 240 hour(s))  SARS Coronavirus 2 by RT PCR (hospital order, performed in [facility] hospital lab) Nasopharyngeal Nasopharyngeal Swab     Status: Abnormal   Collection Time: 06/08/20  6:37 PM   Specimen: Nasopharyngeal Swab  Result Value Ref Range Status   SARS Coronavirus 2 POSITIVE (A) NEGATIVE Final    Comment: CRITICAL RESULT CALLED TO, READ BACK BY AND VERIFIED WITH: DR. Regenia Skeeter @ 2120 06/08/20 BY TURNER,S. (NOTE) SARS-CoV-2 target nucleic acids are DETECTED  SARS-CoV-2 RNA is generally detectable in upper respiratory specimens  during the acute phase of infection.  Positive results are indicative  of the presence of the identified virus, but do not rule out bacterial infection or co-infection with other pathogens not detected by the test.  Clinical correlation with patient history and  other diagnostic information is necessary to determine patient infection status.  The expected result is negative.  Fact Sheet for Patients:   StrictlyIdeas.no   Fact Sheet for Healthcare Providers:   BankingDealers.co.za    This test is not yet approved or cleared by the [place] FDA and  has been authorized for detection and/or diagnosis of SARS-CoV-2 by FDA under an Emergency Use Authorization (EUA).  This EUA will remain in effect ( meaning this  test can be used) for the duration of  the COVID-19 declaration under Section 564(b)(1) of the Act, 21 U.S.C. section 360-bbb-3(b)(1), unless the authorization is terminated or revoked sooner.  Performed at [facility], [place] [address] [place], [place] [address]     Radiology Reports DG Chest 2 View  Result Date: 05/16/2020 CLINICAL DATA:  Dysphagia, chest pain EXAM: CHEST - 2 VIEW COMPARISON:  05/02/2020 small FINDINGS: The heart size and mediastinal contours are within normal limits. Atherosclerotic calcification of the aortic knob. Mildly hyperinflated lungs. Streaky interstitial opacity within the right upper lobe. No pleural effusion or pneumothorax. The visualized skeletal structures are unremarkable. IMPRESSION: 1. Streaky interstitial opacity within the right upper lobe suspicious for developing infection. 2. Mildly hyperinflated lungs, suggesting underlying COPD. Electronically Signed   By: [name] D.O.   On: 05/16/2020 08:25   CT Angio Chest PE W and/or Wo Contrast  Result Date: 05/16/2020 CLINICAL DATA:  Concern for pill stuck in throat for 2 days, now with pain with swallowing EXAM: CT ANGIOGRAPHY CHEST WITH CONTRAST TECHNIQUE: Multidetector CT imaging of the chest was performed using the standard protocol during bolus administration of intravenous contrast. Multiplanar CT image reconstructions and MIPs were obtained to evaluate the vascular anatomy. CONTRAST:  55mL OMNIPAQUE IOHEXOL 350 MG/ML SOLN COMPARISON:  Radiograph 05/16/2020 FINDINGS: Cardiovascular: Satisfactory opacification the pulmonary arteries to the segmental level. No pulmonary artery filling defects are identified. Central pulmonary arteries are normal caliber. Atherosclerotic plaque within the normal caliber aorta. No acute luminal abnormality nor periaortic stranding or hemorrhage. Normal 3 vessel branching of the aortic arch. Atheromatous plaque in the otherwise normal proximal great  vessels. Normal heart size. No pericardial effusion. Coronary artery atherosclerosis is present. Mediastinum/Nodes: No mediastinal fluid or gas. Normal thyroid gland and thoracic inlet.  No acute abnormality of the trachea. Diffuse circumferential thickening of the thoracic esophagus without para esophageal fluid, gas or adenopathy. No worrisome mediastinal, hilar or axillary adenopathy. Lungs/Pleura: Corresponding to the streaky opacity present in the right upper lobe on comparison chest radiograph spiculated nodule in the right lung apex measuring 2.1 x 1.7 cm in size highly conspicuous for a bronchogenic carcinoma. Additional subpleural 3 mm nodule in the posterior segment right upper lobe (5/34). 2 mm nodule along the left major fissure in the left upper lobe (5/44). No other conspicuous nodules or masses. Background of centrilobular and paraseptal emphysema in the lungs. No consolidation, features of edema, pneumothorax, or effusion. Upper Abdomen: 2.7 x 1.5 cm heterogeneously enhancing which appears to arise from the adrenal gland concerning for potential metastatic disease. No other acute upper abdominal abnormality. Musculoskeletal: The osseous structures appear diffusely demineralized which may limit detection of small or nondisplaced fractures. No acute osseous abnormality or suspicious osseous lesion. Review of the MIP images confirms the above findings. IMPRESSION: 1. No evidence of pulmonary embolism. 2. Corresponding to the streaky opacity present in the right upper lobe on comparison chest radiograph, spiculated nodule in the right lung apex measuring 2.1 x 1.7 cm in size highly conspicuous for a bronchogenic carcinoma. Consider referral to multi disciplinary case conference for further management decisions. 3. 2.7 x 1.5 cm heterogeneously enhancing which appears to arise from the right adrenal gland concerning for potential metastatic disease. 4. Diffuse circumferential thickening of the thoracic  esophagus, likely to correlate with patient's symptoms of esophagitis. 5. Aortic Atherosclerosis (ICD10-I70.0). 6. 7. These results were called by telephone at the time of interpretation on 05/16/2020 at 3:10 pm to provider [name] , who verbally acknowledged these results. Electronically Signed   By: Lovena Le M.D.   On: 05/16/2020 15:10   MR BRAIN W WO CONTRAST  Result Date: 05/29/2020 CLINICAL DATA:  Neoplasm, staging. EXAM: MRI HEAD WITHOUT AND WITH CONTRAST MRI CERVICAL SPINE WITHOUT AND WITH CONTRAST MRI LUMBAR SPINE WITHOUT AND WITH CONTRAST TECHNIQUE: Multiplanar, multiecho pulse sequences of the brain and surrounding structures, and cervical spine, to include the craniocervical junction and cervicothoracic junction, were obtained without and with intravenous contrast. Multiplanar and multiecho pulse sequences of the lumbar spine were obtained without and with intravenous contrast. CONTRAST:  63mL GADAVIST GADOBUTROL 1 MMOL/ML IV SOLN COMPARISON:  05/02/2020 lumbar spine radiographs. 05/22/2020 nuclear medicine PET scan. FINDINGS: MRI HEAD FINDINGS Brain: Scattered T2 hyperintense periventricular and subcortical white matter foci are nonspecific however commonly associated with chronic microvascular ischemic changes. Mild cerebral atrophy with ex vacuo dilatation. No acute infarct or intracranial hemorrhage. Tiny left temporal remote microhemorrhage. Sequela of remote left thalamic and right midbrain insults. No midline shift, ventriculomegaly or extra-axial fluid collection. Normal appearance of the sella turcica and pituitary gland. -3 mm inferior right cerebellar enhancing focus (18:14). -punctate medial left cerebellar enhancement (18:34). Vascular: Major intracranial flow voids are preserved. Skull and upper cervical spine: 2 mm right parietal enhancing focus (18:120). Scattered enhancing foci measuring up to 4 mm within the basilar and anterolateral occipital bone (18:24). Sinuses/Orbits:  Normal orbits. Minimal ethmoid and left maxillary sinus mucosal thickening. Trace right mastoid effusion. Other: 6 mm nonenhancing diffusion restricting right occipital scalp nodule (5:16), likely an epidermal inclusion cyst. MRI CERVICAL SPINE FINDINGS Alignment: Normal. Vertebrae: Diffusely T1 hypointense appearance of the bone marrow. Vertebral body heights are preserved. Bone marrow edema involving the left C4-5 and C5-6 facet joints. Cord: Normal signal intensity morphology.  No  abnormal enhancement. Posterior Fossa, vertebral arteries: Please see MRI brain. Disc levels: Multilevel desiccation.  Mild C5-6 disc space loss. C2-3: No significant disc bulge, spinal canal or neural foraminal narrowing. C3-4: Small central protrusion with uncovertebral and facet hypertrophy. Patent spinal canal and left neural foramen. Mild right neural foraminal narrowing. C4-5: There is ill-defined enhancing soft tissue encasing the left C4-5 facet joint measuring 2.0 x 1.6 cm (12:16). The soft tissue extends into the left neural and transverse foramen, encasing the left vertebral artery (12:15). Left predominant and dorsal C5 vertebral body erosions. Prevertebral soft tissue thickening and enhancement is concerning for extension of tumor. There is also ill-defined enhancement along the right anterolateral C5 vertebral body (12:15). Inflammatory changes are seen within the left paraspinal region (11:13). Circumferential soft tissue thickening and enhancement involving the epidural space at the C4-5 level. Small disc osteophyte complex with uncovertebral and right facet hypertrophy. Mild spinal canal narrowing. Patent right neural foramen. Moderate left neural foraminal narrowing secondary to soft tissue encroachment. C5-6: There is inferior extension of ill-defined soft tissue along the left transverse/neural foramen and anterior left facet joint (12:12) left facet joint bone marrow edema. Prominence of mild enhancement involving  the epidural space. Disc osteophyte complex with uncovertebral and facet hypertrophy. Mild spinal canal and right neural foraminal narrowing. Soft tissue encroachment narrows the left neural foramen. C6-7: Disc osteophyte complex with uncovertebral and facet hypertrophy. Patent spinal canal and bilateral neural foramina. C7-T1: Disc osteophyte complex with uncovertebral and facet hypertrophy. Patent spinal canal and neural foramen. Paraspinal tissues: As detailed above. MRI LUMBAR SPINE FINDINGS Segmentation:  Standard. Alignment:  Normal. Vertebrae: Diffuse bone marrow heterogeneity. Multilevel osteophytosis. Metastases are detailed below. Conus medullaris and cauda equina: Conus extends to the L2 level. Conus and cauda equina appear normal. Filar lipoma. Disc levels: Multilevel desiccation and disc space loss most prominent at the L5-S1 level. L1-2: No significant disc bulge, spinal canal or neural foraminal narrowing. L2-3: Disc bulge, ligamentum flavum and bilateral facet hypertrophy. Mild spinal canal and bilateral neural foraminal narrowing. L3-4: L3 superior endplate pathologic fracture deformity with approximately 10-20% height loss. 4.7 x 3.4 cm enhancing metastatic lesion involving the right L3 vertebral body and posterior elements (13:19). There is extension of enhancing tumor into the prevertebral and anterior epidural spaces at the L3 level (13:20, 8:19) effacing the lateral recesses and abutting the exiting bilateral L3 nerve roots. Ligamentum flavum and bilateral facet hypertrophy. Mild spinal canal and bilateral neural foraminal narrowing. L4-5: Disc bulge abutting the exiting left L4 nerve root, ligamentum flavum and bilateral facet hypertrophy. Mild spinal canal and moderate bilateral neural foraminal narrowing. Abnormal right L4-5 neural foramen soft tissue is discussed below. L5-S1: Disc bulge with superimposed central protrusion abutting the descending bilateral S1 nerve roots, ligamentum flavum  and bilateral facet hypertrophy. Patent spinal canal. Mild bilateral neural foraminal narrowing. Paraspinal and other soft tissues: At the level of the lumbosacral junction and extending along the right sacroiliac joint there is enhancing soft tissue measuring 4.2 x 2.6 cm (13:34). Soft tissue extends into the right L4-5 neural foramen (8:31) there are adjacent erosions involving the right lateral L5 vertebral body and superior right sacral ala. Anterior cortical defect is concerning for an associated pathologic fracture (8:36). IMPRESSION: MRI brain: -Sub-5 mm bilateral cerebellar enhancing foci are concerning for metastases. -Sub-5 mm right parietal bone and basilar occipital enhancing foci, concerning for osseous metastases. -Sequela of remote left thalamic and right midbrain insults. Mild cerebral atrophy and chronic microvascular ischemic changes. MRI  cervical spine: -Metastatic soft tissue involving the left C5-6 facet joint and right anterolateral C5 vertebral body. There is extension of tumor into the prevertebral and circumferential epidural space at the C5-6 level without significant spinal canal narrowing. -Tumor also extends to involve the left C5-6 and C6-7 transverse and neural foramen with partial encasement of the vertebral artery. -No evidence of spinal cord enhancement. MRI lumbar spine: - L3 metastatic lesion and pathologic fracture with approximately 20% height loss. There is extension of tumor into the prevertebral and anterior epidural space at the L3 level with mild spinal canal and bilateral neural foraminal narrowing. - Partially imaged 4.2 cm right lumbosacral junction/SI joint metastasis with extension of tumor into the right L4-5 neural foramen. Query associated pathologic fracture. - Please see concurrent MR sacrum for better characterization. These results will be called to the ordering clinician or representative by the Radiologist Assistant, and communication documented in the PACS or  [employer]. Electronically Signed   By: Primitivo Gauze M.D.   On: 05/29/2020 14:49   MR Cervical Spine W Wo Contrast  Result Date: 05/29/2020 CLINICAL DATA:  Neoplasm, staging. EXAM: MRI HEAD WITHOUT AND WITH CONTRAST MRI CERVICAL SPINE WITHOUT AND WITH CONTRAST MRI LUMBAR SPINE WITHOUT AND WITH CONTRAST TECHNIQUE: Multiplanar, multiecho pulse sequences of the brain and surrounding structures, and cervical spine, to include the craniocervical junction and cervicothoracic junction, were obtained without and with intravenous contrast. Multiplanar and multiecho pulse sequences of the lumbar spine were obtained without and with intravenous contrast. CONTRAST:  7mL GADAVIST GADOBUTROL 1 MMOL/ML IV SOLN COMPARISON:  05/02/2020 lumbar spine radiographs. 05/22/2020 nuclear medicine PET scan. FINDINGS: MRI HEAD FINDINGS Brain: Scattered T2 hyperintense periventricular and subcortical white matter foci are nonspecific however commonly associated with chronic microvascular ischemic changes. Mild cerebral atrophy with ex vacuo dilatation. No acute infarct or intracranial hemorrhage. Tiny left temporal remote microhemorrhage. Sequela of remote left thalamic and right midbrain insults. No midline shift, ventriculomegaly or extra-axial fluid collection. Normal appearance of the sella turcica and pituitary gland. -3 mm inferior right cerebellar enhancing focus (18:14). -punctate medial left cerebellar enhancement (18:34). Vascular: Major intracranial flow voids are preserved. Skull and upper cervical spine: 2 mm right parietal enhancing focus (18:120). Scattered enhancing foci measuring up to 4 mm within the basilar and anterolateral occipital bone (18:24). Sinuses/Orbits: Normal orbits. Minimal ethmoid and left maxillary sinus mucosal thickening. Trace right mastoid effusion. Other: 6 mm nonenhancing diffusion restricting right occipital scalp nodule (5:16), likely an epidermal inclusion cyst. MRI CERVICAL SPINE  FINDINGS Alignment: Normal. Vertebrae: Diffusely T1 hypointense appearance of the bone marrow. Vertebral body heights are preserved. Bone marrow edema involving the left C4-5 and C5-6 facet joints. Cord: Normal signal intensity morphology.  No abnormal enhancement. Posterior Fossa, vertebral arteries: Please see MRI brain. Disc levels: Multilevel desiccation.  Mild C5-6 disc space loss. C2-3: No significant disc bulge, spinal canal or neural foraminal narrowing. C3-4: Small central protrusion with uncovertebral and facet hypertrophy. Patent spinal canal and left neural foramen. Mild right neural foraminal narrowing. C4-5: There is ill-defined enhancing soft tissue encasing the left C4-5 facet joint measuring 2.0 x 1.6 cm (12:16). The soft tissue extends into the left neural and transverse foramen, encasing the left vertebral artery (12:15). Left predominant and dorsal C5 vertebral body erosions. Prevertebral soft tissue thickening and enhancement is concerning for extension of tumor. There is also ill-defined enhancement along the right anterolateral C5 vertebral body (12:15). Inflammatory changes are seen within the left paraspinal region (11:13). Circumferential soft  tissue thickening and enhancement involving the epidural space at the C4-5 level. Small disc osteophyte complex with uncovertebral and right facet hypertrophy. Mild spinal canal narrowing. Patent right neural foramen. Moderate left neural foraminal narrowing secondary to soft tissue encroachment. C5-6: There is inferior extension of ill-defined soft tissue along the left transverse/neural foramen and anterior left facet joint (12:12) left facet joint bone marrow edema. Prominence of mild enhancement involving the epidural space. Disc osteophyte complex with uncovertebral and facet hypertrophy. Mild spinal canal and right neural foraminal narrowing. Soft tissue encroachment narrows the left neural foramen. C6-7: Disc osteophyte complex with  uncovertebral and facet hypertrophy. Patent spinal canal and bilateral neural foramina. C7-T1: Disc osteophyte complex with uncovertebral and facet hypertrophy. Patent spinal canal and neural foramen. Paraspinal tissues: As detailed above. MRI LUMBAR SPINE FINDINGS Segmentation:  Standard. Alignment:  Normal. Vertebrae: Diffuse bone marrow heterogeneity. Multilevel osteophytosis. Metastases are detailed below. Conus medullaris and cauda equina: Conus extends to the L2 level. Conus and cauda equina appear normal. Filar lipoma. Disc levels: Multilevel desiccation and disc space loss most prominent at the L5-S1 level. L1-2: No significant disc bulge, spinal canal or neural foraminal narrowing. L2-3: Disc bulge, ligamentum flavum and bilateral facet hypertrophy. Mild spinal canal and bilateral neural foraminal narrowing. L3-4: L3 superior endplate pathologic fracture deformity with approximately 10-20% height loss. 4.7 x 3.4 cm enhancing metastatic lesion involving the right L3 vertebral body and posterior elements (13:19). There is extension of enhancing tumor into the prevertebral and anterior epidural spaces at the L3 level (13:20, 8:19) effacing the lateral recesses and abutting the exiting bilateral L3 nerve roots. Ligamentum flavum and bilateral facet hypertrophy. Mild spinal canal and bilateral neural foraminal narrowing. L4-5: Disc bulge abutting the exiting left L4 nerve root, ligamentum flavum and bilateral facet hypertrophy. Mild spinal canal and moderate bilateral neural foraminal narrowing. Abnormal right L4-5 neural foramen soft tissue is discussed below. L5-S1: Disc bulge with superimposed central protrusion abutting the descending bilateral S1 nerve roots, ligamentum flavum and bilateral facet hypertrophy. Patent spinal canal. Mild bilateral neural foraminal narrowing. Paraspinal and other soft tissues: At the level of the lumbosacral junction and extending along the right sacroiliac joint there is  enhancing soft tissue measuring 4.2 x 2.6 cm (13:34). Soft tissue extends into the right L4-5 neural foramen (8:31) there are adjacent erosions involving the right lateral L5 vertebral body and superior right sacral ala. Anterior cortical defect is concerning for an associated pathologic fracture (8:36). IMPRESSION: MRI brain: -Sub-5 mm bilateral cerebellar enhancing foci are concerning for metastases. -Sub-5 mm right parietal bone and basilar occipital enhancing foci, concerning for osseous metastases. -Sequela of remote left thalamic and right midbrain insults. Mild cerebral atrophy and chronic microvascular ischemic changes. MRI cervical spine: -Metastatic soft tissue involving the left C5-6 facet joint and right anterolateral C5 vertebral body. There is extension of tumor into the prevertebral and circumferential epidural space at the C5-6 level without significant spinal canal narrowing. -Tumor also extends to involve the left C5-6 and C6-7 transverse and neural foramen with partial encasement of the vertebral artery. -No evidence of spinal cord enhancement. MRI lumbar spine: - L3 metastatic lesion and pathologic fracture with approximately 20% height loss. There is extension of tumor into the prevertebral and anterior epidural space at the L3 level with mild spinal canal and bilateral neural foraminal narrowing. - Partially imaged 4.2 cm right lumbosacral junction/SI joint metastasis with extension of tumor into the right L4-5 neural foramen. Query associated pathologic fracture. - Please see concurrent  MR sacrum for better characterization. These results will be called to the ordering clinician or representative by the Radiologist Assistant, and communication documented in the PACS or [employer]. Electronically Signed   By: Primitivo Gauze M.D.   On: 05/29/2020 14:49   MR Lumbar Spine W Wo Contrast  Result Date: 05/29/2020 CLINICAL DATA:  Neoplasm, staging. EXAM: MRI HEAD WITHOUT AND WITH  CONTRAST MRI CERVICAL SPINE WITHOUT AND WITH CONTRAST MRI LUMBAR SPINE WITHOUT AND WITH CONTRAST TECHNIQUE: Multiplanar, multiecho pulse sequences of the brain and surrounding structures, and cervical spine, to include the craniocervical junction and cervicothoracic junction, were obtained without and with intravenous contrast. Multiplanar and multiecho pulse sequences of the lumbar spine were obtained without and with intravenous contrast. CONTRAST:  45mL GADAVIST GADOBUTROL 1 MMOL/ML IV SOLN COMPARISON:  05/02/2020 lumbar spine radiographs. 05/22/2020 nuclear medicine PET scan. FINDINGS: MRI HEAD FINDINGS Brain: Scattered T2 hyperintense periventricular and subcortical white matter foci are nonspecific however commonly associated with chronic microvascular ischemic changes. Mild cerebral atrophy with ex vacuo dilatation. No acute infarct or intracranial hemorrhage. Tiny left temporal remote microhemorrhage. Sequela of remote left thalamic and right midbrain insults. No midline shift, ventriculomegaly or extra-axial fluid collection. Normal appearance of the sella turcica and pituitary gland. -3 mm inferior right cerebellar enhancing focus (18:14). -punctate medial left cerebellar enhancement (18:34). Vascular: Major intracranial flow voids are preserved. Skull and upper cervical spine: 2 mm right parietal enhancing focus (18:120). Scattered enhancing foci measuring up to 4 mm within the basilar and anterolateral occipital bone (18:24). Sinuses/Orbits: Normal orbits. Minimal ethmoid and left maxillary sinus mucosal thickening. Trace right mastoid effusion. Other: 6 mm nonenhancing diffusion restricting right occipital scalp nodule (5:16), likely an epidermal inclusion cyst. MRI CERVICAL SPINE FINDINGS Alignment: Normal. Vertebrae: Diffusely T1 hypointense appearance of the bone marrow. Vertebral body heights are preserved. Bone marrow edema involving the left C4-5 and C5-6 facet joints. Cord: Normal signal intensity  morphology.  No abnormal enhancement. Posterior Fossa, vertebral arteries: Please see MRI brain. Disc levels: Multilevel desiccation.  Mild C5-6 disc space loss. C2-3: No significant disc bulge, spinal canal or neural foraminal narrowing. C3-4: Small central protrusion with uncovertebral and facet hypertrophy. Patent spinal canal and left neural foramen. Mild right neural foraminal narrowing. C4-5: There is ill-defined enhancing soft tissue encasing the left C4-5 facet joint measuring 2.0 x 1.6 cm (12:16). The soft tissue extends into the left neural and transverse foramen, encasing the left vertebral artery (12:15). Left predominant and dorsal C5 vertebral body erosions. Prevertebral soft tissue thickening and enhancement is concerning for extension of tumor. There is also ill-defined enhancement along the right anterolateral C5 vertebral body (12:15). Inflammatory changes are seen within the left paraspinal region (11:13). Circumferential soft tissue thickening and enhancement involving the epidural space at the C4-5 level. Small disc osteophyte complex with uncovertebral and right facet hypertrophy. Mild spinal canal narrowing. Patent right neural foramen. Moderate left neural foraminal narrowing secondary to soft tissue encroachment. C5-6: There is inferior extension of ill-defined soft tissue along the left transverse/neural foramen and anterior left facet joint (12:12) left facet joint bone marrow edema. Prominence of mild enhancement involving the epidural space. Disc osteophyte complex with uncovertebral and facet hypertrophy. Mild spinal canal and right neural foraminal narrowing. Soft tissue encroachment narrows the left neural foramen. C6-7: Disc osteophyte complex with uncovertebral and facet hypertrophy. Patent spinal canal and bilateral neural foramina. C7-T1: Disc osteophyte complex with uncovertebral and facet hypertrophy. Patent spinal canal and neural foramen. Paraspinal tissues: As detailed  above.  MRI LUMBAR SPINE FINDINGS Segmentation:  Standard. Alignment:  Normal. Vertebrae: Diffuse bone marrow heterogeneity. Multilevel osteophytosis. Metastases are detailed below. Conus medullaris and cauda equina: Conus extends to the L2 level. Conus and cauda equina appear normal. Filar lipoma. Disc levels: Multilevel desiccation and disc space loss most prominent at the L5-S1 level. L1-2: No significant disc bulge, spinal canal or neural foraminal narrowing. L2-3: Disc bulge, ligamentum flavum and bilateral facet hypertrophy. Mild spinal canal and bilateral neural foraminal narrowing. L3-4: L3 superior endplate pathologic fracture deformity with approximately 10-20% height loss. 4.7 x 3.4 cm enhancing metastatic lesion involving the right L3 vertebral body and posterior elements (13:19). There is extension of enhancing tumor into the prevertebral and anterior epidural spaces at the L3 level (13:20, 8:19) effacing the lateral recesses and abutting the exiting bilateral L3 nerve roots. Ligamentum flavum and bilateral facet hypertrophy. Mild spinal canal and bilateral neural foraminal narrowing. L4-5: Disc bulge abutting the exiting left L4 nerve root, ligamentum flavum and bilateral facet hypertrophy. Mild spinal canal and moderate bilateral neural foraminal narrowing. Abnormal right L4-5 neural foramen soft tissue is discussed below. L5-S1: Disc bulge with superimposed central protrusion abutting the descending bilateral S1 nerve roots, ligamentum flavum and bilateral facet hypertrophy. Patent spinal canal. Mild bilateral neural foraminal narrowing. Paraspinal and other soft tissues: At the level of the lumbosacral junction and extending along the right sacroiliac joint there is enhancing soft tissue measuring 4.2 x 2.6 cm (13:34). Soft tissue extends into the right L4-5 neural foramen (8:31) there are adjacent erosions involving the right lateral L5 vertebral body and superior right sacral ala. Anterior cortical defect  is concerning for an associated pathologic fracture (8:36). IMPRESSION: MRI brain: -Sub-5 mm bilateral cerebellar enhancing foci are concerning for metastases. -Sub-5 mm right parietal bone and basilar occipital enhancing foci, concerning for osseous metastases. -Sequela of remote left thalamic and right midbrain insults. Mild cerebral atrophy and chronic microvascular ischemic changes. MRI cervical spine: -Metastatic soft tissue involving the left C5-6 facet joint and right anterolateral C5 vertebral body. There is extension of tumor into the prevertebral and circumferential epidural space at the C5-6 level without significant spinal canal narrowing. -Tumor also extends to involve the left C5-6 and C6-7 transverse and neural foramen with partial encasement of the vertebral artery. -No evidence of spinal cord enhancement. MRI lumbar spine: - L3 metastatic lesion and pathologic fracture with approximately 20% height loss. There is extension of tumor into the prevertebral and anterior epidural space at the L3 level with mild spinal canal and bilateral neural foraminal narrowing. - Partially imaged 4.2 cm right lumbosacral junction/SI joint metastasis with extension of tumor into the right L4-5 neural foramen. Query associated pathologic fracture. - Please see concurrent MR sacrum for better characterization. These results will be called to the ordering clinician or representative by the Radiologist Assistant, and communication documented in the PACS or [employer]. Electronically Signed   By: Primitivo Gauze M.D.   On: 05/29/2020 14:49   MR SACRUM SI JOINTS W WO CONTRAST  Result Date: 05/30/2020 CLINICAL DATA:  Patient with a history of metastatic lung carcinoma. Onset low back pain approximately 1 month ago. Hypermetabolic lesion in the right sacrum on PET scan 05/22/2020. EXAM: MRI SACRUM WITHOUT AND WITH CONTRAST TECHNIQUE: Multiplanar, multisequence MR imaging of the sacrum was performed both before  and after administration of intravenous contrast. CONTRAST:  6 mL GADAVIST IV SOLN COMPARISON:  PET scan 05/22/2020. FINDINGS: Bones/Joint/Cartilage As seen on the prior PET scan, there  is a destructive, enhancing mass lesion in the right sacrum measuring approximately 4.4 cm transverse by 2.5 cm AP by 4.7 cm craniocaudal. The right sacral component of the lesion extends into the inferior aspect of the right L5-S1 foramen and impinges on the right L5 root within and beyond the foramen. Abnormal signal and enhancement in the right ilium consistent with tumor immediately posterior to the soft tissue mass measures 3.1 cm transverse by 1.0 cm AP by 4.8 cm craniocaudal. Tumor does not appear to impact the right S1 root. Ligaments Intact. Muscles and Tendons Intact and normal in appearance. Soft tissues Imaged intrapelvic contents are unremarkable. IMPRESSION: Metastatic disease in the right sacrum and posterior right ilium results in impingement on the right L5 root in the periphery of the foramen and beyond the foramen. Electronically Signed   By: Inge Rise M.D.   On: 05/30/2020 09:27   NM PET Image Initial (PI) Skull Base To Thigh  Result Date: 05/22/2020 CLINICAL DATA:  Initial treatment strategy for lung nodule, adrenal mass. EXAM: NUCLEAR MEDICINE PET SKULL BASE TO THIGH TECHNIQUE: 7.8 mCi F-18 FDG was injected intravenously. Full-ring PET imaging was performed from the skull base to thigh after the radiotracer. CT data was obtained and used for attenuation correction and anatomic localization. Fasting blood glucose: 94 mg/dl COMPARISON:  CT chest 05/16/2020. FINDINGS: Mediastinal blood pool activity: SUV max 2.0 Liver activity: SUV max NA NECK: There is focal hypermetabolism in the sellar region, SUV max 8.4, without a definite CT correlate. No hypermetabolic lymph nodes. Incidental CT findings: None. CHEST: Spiculated nodule in the apical segment right upper lobe measures 1.9 x 2.2 cm with an SUV max of  10.9. There is abnormal hypermetabolism associated with apical segmental bronchial wall thickening and mucoid impaction (3/79, 84 and 87), SUV max 3.8. Hypermetabolic right hilar lymph node measures 7 mm (3/95) with an SUV max of 4.0. Right paratracheal lymph node measures 5 mm (3/81) with an SUV max of 3.1. AP window lymph node measures 6 mm (3/81) with an SUV max 4.9. Focal left hilar metabolism has an SUV max of 3.3 and corresponds to a 5 mm lymph node, better seen on contrast infused CT chest 05/16/2020. New peribronchovascular ground-glass in the superior segment left lower lobe (3/97) has an SUV max 2.8. Mild mid esophageal long segment hypermetabolism, without a definite CT correlate. Incidental CT findings: Atherosclerotic calcification of the aorta and coronary arteries. Heart size normal. No pericardial or pleural effusion. Centrilobular and paraseptal emphysema. ABDOMEN/PELVIS: Mildly hyperattenuating right adrenal mass measures 1.5 x 2.5 cm with an SUV max 28.6. There is focal hypermetabolism in the left adrenal gland, SUV max 5.6, without a definite CT correlate. No abnormal hypermetabolism in the liver, spleen, pancreas or lymph nodes. Incidental CT findings: Liver, gallbladder, kidneys, spleen, pancreas, stomach and bowel are otherwise unremarkable. Atherosclerotic calcification of the aorta without aneurysm. Slight bladder wall thickening. Prostate is normal in size. SKELETON: There are multiple hypermetabolic lytic lesions in the cervical spine, right scapula, posterior left tenth rib, L3, right sacrum and right pubic bone. Index lesion in the right sacral ala measures 2.9 x 3.4 cm (3/194) with an SUV max of 41.3. Incidental CT findings: None. IMPRESSION: 1. 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 2. Focal  hypermetabolism in the sellar region, without a definite CT correlate. Consider MR brain without and  with contrast in further evaluation, as clinically indicated. 3. New peribronchovascular ground-glass in the superior segment left lower lobe, likely infectious or inflammatory in etiology. 4. Aortic atherosclerosis (ICD10-I70.0). Coronary artery calcification. 5.  Emphysema (ICD10-J43.9). Electronically Signed   By: Lorin Picket M.D.   On: 05/22/2020 11:11   CT Biopsy  Result Date: 05/27/2020 INDICATION: 64 year old with a suspicious right lung nodule and suspect metastatic lung disease. Patient has a lucent bone lesion in the right sacral ala. Tissue diagnosis is needed. EXAM: CT-GUIDED CORE BIOPSY OF RIGHT SACRAL BONE LESION Physician: Stephan Minister. Anselm Pancoast, MD MEDICATIONS: None. ANESTHESIA/SEDATION: Fentanyl 50 mcg IV; Versed 1.0 mg IV Moderate Sedation Time:  19 minutes The patient was continuously monitored during the procedure by the interventional radiology nurse under my direct supervision. COMPLICATIONS: None immediate. PROCEDURE: The procedure was explained to the patient. The risks and benefits of the procedure were discussed and the patient's questions were addressed. Informed consent was obtained from the patient. Time-out was performed. Patient was placed supine on CT scanner. CT images through the lower abdomen were obtained. Right side of the abdomen was prepped with chlorhexidine and sterile field was created. Skin was anesthetized with 1% lidocaine. Small skin incision was made. Using CT guidance, a 17 gauge coaxial needle was directed into the right sacral lesion. Needle was positioned along the lateral aspect of the lesion in order to avoid the nerve. Multiple core biopsies were obtained with an 18 gauge core device. Specimens placed in formalin. 17 gauge needle was removed without complication. Bandage placed over the puncture site. FINDINGS: Destructive lucent lesion involving the right sacral  ala. Biopsy needle was confirmed within the lesion. Core specimens obtained. IMPRESSION: CT-guided core biopsies of the right sacral bone lesion. Electronically Signed   By: Markus Daft M.D.   On: 05/27/2020 12:56   DG Chest Port 1 View  Result Date: 06/08/2020 CLINICAL DATA:  Dyspnea. Recent diagnosis of lung cancer. Family positive for COVID-19. EXAM: PORTABLE CHEST 1 VIEW COMPARISON:  Radiograph and CT 05/16/2020.  PET CT 05/22/2020. FINDINGS: The lungs are hyperinflated. Stable heart size and mediastinal contours. No convincing acute airspace disease. The known spiculated nodule at the right lung apex is only faintly visualized. No pneumothorax or large pleural effusion. Known osseous metastasis on PET not well seen by radiograph. IMPRESSION: 1. No acute findings. 2. Chronic hyperinflation. The known spiculated nodule at the right lung apex is only faintly visualized radiographically. Electronically Signed   By: Keith Rake M.D.   On: 06/08/2020 18:43   DG ESOPHAGUS W DOUBLE CM (HD)  Result Date: 05/17/2020 CLINICAL DATA:  Odynophagia.  Difficulty swallowing. EXAM: ESOPHOGRAM / BARIUM SWALLOW / BARIUM TABLET STUDY TECHNIQUE: Combined double contrast and single contrast examination performed using effervescent crystals, thick barium liquid, and thin barium liquid. The patient was observed with fluoroscopy swallowing a 13 mm barium sulphate tablet. FLUOROSCOPY TIME:  Fluoroscopy Time:  1 minutes 6 seconds Radiation Exposure Index (if provided by the fluoroscopic device): 3.7 mGy Number of Acquired Spot Images: 0 COMPARISON:  None. FINDINGS: Limited evaluation secondary to patient motion resulting from pain. Normal pharyngeal anatomy and motility. Contrast flowed freely through the esophagus without evidence of a stricture or mass. Mild mucosal thickening and irregularity without ulceration. Esophageal motility was normal. Mild gastroesophageal reflux. No definite hiatal hernia was demonstrated. At the  end of the examination a 13 mm barium tablet was administered which transited through the esophagus and esophagogastric junction without delay. IMPRESSION: 1. Mild mucosal thickening  and irregularity without ulceration as can be seen with mild esophagitis. 2. Mild gastroesophageal reflux. 3. No esophageal stricture. Electronically Signed   By: Kathreen Devoid   On: 05/17/2020 08:32    Lab Data:  CBC: Recent Labs  Lab 06/08/20 1831 06/09/20 0441  WBC 16.7* 13.3*  NEUTROABS 15.1* 12.2*  HGB 9.4* 9.6*  HCT 29.5* 30.3*  MCV 93.4 93.8  PLT 236 101   Basic Metabolic Panel: Recent Labs  Lab 06/08/20 1831 06/08/20 2121 06/09/20 0441  NA 134*  --  138  K 4.2  --  3.7  CL 91*  --  93*  CO2 31  --  31  GLUCOSE 107*  --  140*  BUN 24*  --  24*  CREATININE 1.14  --  1.05  CALCIUM 8.0*  --  8.3*  MG  --  2.8* 2.4  PHOS  --   --  3.5   GFR: Estimated Creatinine Clearance: 61 mL/min (by C-G formula based on SCr of 1.05 mg/dL). Liver Function Tests: Recent Labs  Lab 06/08/20 1831 06/09/20 0441  AST 87* 66*  ALT 25 25  ALKPHOS 66 64  BILITOT 0.7 0.6  PROT 6.6 6.3*  ALBUMIN 2.8* 2.8*   No results for input(s): LIPASE, AMYLASE in the last 168 hours. No results for input(s): AMMONIA in the last 168 hours. Coagulation Profile: No results for input(s): INR, PROTIME in the last 168 hours. Cardiac Enzymes: No results for input(s): CKTOTAL, CKMB, CKMBINDEX, TROPONINI in the last 168 hours. BNP (last 3 results) No results for input(s): PROBNP in the last 8760 hours. HbA1C: No results for input(s): HGBA1C in the last 72 hours. CBG: No results for input(s): GLUCAP in the last 168 hours. Lipid Profile: Recent Labs    06/08/20 2121  TRIG 102   Thyroid Function Tests: No results for input(s): TSH, T4TOTAL, FREET4, T3FREE, THYROIDAB in the last 72 hours. Anemia Panel: Recent Labs    06/08/20 2121 06/09/20 0441  FERRITIN 269 260   Urine analysis:    Component Value Date/Time     COLORURINE YELLOW 06/09/2020 0441   APPEARANCEUR CLEAR 06/09/2020 0441   LABSPEC 1.009 06/09/2020 0441   PHURINE 5.0 06/09/2020 0441   GLUCOSEU NEGATIVE 06/09/2020 0441   HGBUR SMALL (A) 06/09/2020 [place] NEGATIVE 06/09/2020 [place] 06/09/2020 0441   PROTEINUR NEGATIVE 06/09/2020 0441   NITRITE NEGATIVE 06/09/2020 0441   LEUKOCYTESUR NEGATIVE 06/09/2020 0441     Jarold Macomber M.D. [facility] 06/09/2020, 10:43 AM   Call night coverage person covering after 7pm

## 2020-06-10 ENCOUNTER — Ambulatory Visit: Payer: Medicare Other

## 2020-06-10 ENCOUNTER — Ambulatory Visit: Payer: Medicare Other | Admitting: Hematology and Oncology

## 2020-06-10 LAB — FERRITIN: Ferritin: 251 ng/mL (ref 24–336)

## 2020-06-10 LAB — COMPREHENSIVE METABOLIC PANEL
ALT: 25 U/L (ref 0–44)
AST: 52 U/L — ABNORMAL HIGH (ref 15–41)
Albumin: 2.4 g/dL — ABNORMAL LOW (ref 3.5–5.0)
Alkaline Phosphatase: 55 U/L (ref 38–126)
Anion gap: 11 (ref 5–15)
BUN: 37 mg/dL — ABNORMAL HIGH (ref 8–23)
CO2: 31 mmol/L (ref 22–32)
Calcium: 8.2 mg/dL — ABNORMAL LOW (ref 8.9–10.3)
Chloride: 92 mmol/L — ABNORMAL LOW (ref 98–111)
Creatinine, Ser: 1.1 mg/dL (ref 0.61–1.24)
GFR calc Af Amer: >60 mL/min (ref >60)
GFR calc non Af Amer: >60 mL/min (ref >60)
Glucose, Bld: 143 mg/dL — ABNORMAL HIGH (ref 70–99)
Potassium: 3.9 mmol/L (ref 3.5–5.1)
Sodium: 134 mmol/L — ABNORMAL LOW (ref 135–145)
Total Bilirubin: 0.5 mg/dL (ref 0.3–1.2)
Total Protein: 5.6 g/dL — ABNORMAL LOW (ref 6.5–8.1)

## 2020-06-10 LAB — CBC
HCT: 26.4 % — ABNORMAL LOW (ref 39.0–52.0)
Hemoglobin: 8.4 g/dL — ABNORMAL LOW (ref 13.0–17.0)
MCH: 29.8 pg (ref 26.0–34.0)
MCHC: 31.8 g/dL (ref 30.0–36.0)
MCV: 93.6 fL (ref 80.0–100.0)
Platelets: 230 10*3/uL (ref 150–400)
RBC: 2.82 MIL/uL — ABNORMAL LOW (ref 4.22–5.81)
RDW: 13.7 % (ref 11.5–15.5)
WBC: 7.4 10*3/uL (ref 4.0–10.5)
nRBC: 0.3 % — ABNORMAL HIGH (ref 0.0–0.2)

## 2020-06-10 LAB — D-DIMER, QUANTITATIVE: D-Dimer, Quant: 0.9 ug/mL-FEU — ABNORMAL HIGH (ref 0.00–0.50)

## 2020-06-10 LAB — GLUCOSE, CAPILLARY: Glucose-Capillary: 188 mg/dL — ABNORMAL HIGH (ref 70–99)

## 2020-06-10 LAB — C-REACTIVE PROTEIN: CRP: 18.3 mg/dL — ABNORMAL HIGH (ref <1.0)

## 2020-06-10 NOTE — Progress Notes (Signed)
PROGRESS NOTE  Douglas [name]  DOB: 03/19/1956  PCP: [name], No Pcp Per FWY:[id number]  DOA: 06/08/2020  LOS: 2 days   Chief Complaint  [name] presents with  . hypoxia  . Shortness of Breath   Brief narrative: Douglas [name] is a 64 y.o. [name] with PMH of severe COPD, recently diagnosed lung cancer on 2 L oxygen at home, continues to smoke, also with history of hypertension, hyperlipidemia. [name] presented to [facility] ED on 06/08/2020 with 2 days of progressive shortness of breath, fatigue, generalized weakness, associated wheezing.   [name] has been living with his daughter.  1 week prior to presentation, [name]'s daughter tested positive for COVID-19.  Subsequently [name] also started getting symptoms in few days.  EMS found his O2 sats to be in 14s, placed on nonrebreather and brought to [facility] ED  In ED, COVID-19 positive, BNP 79 Chest x-ray did not show acute pneumonia. [name] was admitted for further evaluation and management.  Subjective: [name] was seen and examined this morning. [facility] [name].  Propped up in bed.  Feels better. On 10 L oxygen by nasal cannula with saturation 90 to 99%.  Says he wants to go home today Chart reviewed. Labs this morning with improving CRP, D-dimer Hemoglobin down to 8.4 this morning.  Assessment/Plan: COVID pneumonia Acute on chronic respiratory failure with hypoxia  -Presented with shortness of breath, hypoxia after exposure to Covid positive daughter -COVID test: PCR positive on admission -Chest imaging: Chest today did not show any acute infiltrates.  -Treatment: Although [name] has no infiltrates on chest x-ray, he has clear clinical evidence of Covid pneumonia based on positive result, elevated CRP, D-dimer and hypoxia. He has significantly impaired lung function with pre-existing COPD, lung cancer and is at high risk of deterioration. So, he was started on aggressive treatment for Covid with IV remdesivir (5-day course to  complete on 8/31), IV Solu-Medrol 40 mg every 8 hours as well as baricitinib 4 mg daily.   -Oxygen - SpO2: 100 % O2 Flow Rate (L/min): 10 L/min  -Progression: [name] continues to feel better.  He was on 10 L oxygen at [facility] time of my evaluation.  He is a COPD [name] and probably would try well even on low 90s.  I instructed [facility] nurse to wean down his oxygen supplementation.  Labs this morning showed improving CRP and D-dimer. -Continue IV remdesivir, IV Solu-Medrol and baricitinib.  -Protonix while on steroids. -Supportive care: Vitamin C, Zinc, PRN inhalers, Tylenol, Antitussives (benzonatate/ Mucinex/Tussionex).   -Encouraged incentive spirometry, prone position, out of bed and early mobilization as much as possible -Continue airborne/contact isolation precautions for duration of 3 weeks from [facility] day of diagnosis. -WBC and inflammatory markers trend as below.  Lab Results  Component Value Date   SARSCOV2NAA POSITIVE (A) 06/08/2020   [place] NEGATIVE 05/16/2020    Recent Labs  Lab 06/08/20 1831 06/08/20 2121 06/08/20 2245 06/09/20 0441 06/10/20 0438  WBC 16.7*  --   --  13.3* 7.4  LATICACIDVEN  --   --  1.1  --   --   PROCALCITON  --  2.29  --   --   --   DDIMER  --  1.11*  --  1.07* 0.90*  FERRITIN  --  269  --  260 251  LDH  --  319*  --  286*  --   CRP  --  30.9*  --  29.6* 18.3*  ALT 25  --   --  25  25   [facility] treatment plan and use of medications and known side effects were discussed with [name]/family. Some of [facility] medications used are based on case reports/anecdotal data.  All other medications being used in [facility] management of COVID-19 based on limited study data.  Complete risks and long-term side effects are unknown, however in [facility] best clinical judgment they seem to be of some benefit.  [name] wanted to proceed with treatment options provided.  Right lung CA, right adrenal lesion, stage IV bronchogenic CA -Recently admitted (8/5-8/6) with dysphagia and unintentional  weight loss. Work-up revealed right lung mass discerning for malignancy.  PET/CT on 8/11 showed right upper lobe mass with bilateral mediastinal, hilar lymph nodes, bilateral adrenal lesions, lytic osseous lesions concerning for stage IV primary bronchogenic carcinoma.   -Followed by Dr. Mike Gip at [facility], pending XRT. -[name] had a televisit with his oncologist on 8/27, for pain, he was started on OxyContin 10 mg every 8 hours and oxycodone 5 to 10 mg every 4 hours as needed for breakthrough pain along with bowel regimen.  Acute anemia -No active blood loss but his hemoglobin seems to be gradually dropping in last 1 month interval.  It could be secondary to malignancy. -Ferritin level is elevated this admission secondary to Covid. -[name] will follow up with hematology/oncologist as part of his cancer care. Recent Labs    05/16/20 0805 05/17/20 0511 06/08/20 1831 06/09/20 0441 06/10/20 0438  HGB 13.1 11.4* 9.4* 9.6* 8.4*   GERD -On PPI  Essential hypertension -Home meds include losartan/HCTZ -Currently on hold.  Continue monitor blood pressure.  IV hydralazine as needed.  Hyperlipidemia -Continue Crestor  Mobility: Encourage ambulation, out of bed Code Status:   Code Status: Full Code  Nutritional status: Body mass index is 24.96 kg/m.     Diet Order            Diet regular Room service appropriate? Yes; Fluid consistency: Thin  Diet effective now                 DVT prophylaxis: enoxaparin (LOVENOX) injection 40 mg Start: 06/09/20 1000 SCDs Start: 06/08/20 2258   Antimicrobials:  None Fluid: None   Consultants: None Family Communication: : Called and updated [name]'s daughter this morning  Status is: Inpatient  Remains inpatient appropriate because: Daughter states he cannot bring him back for IV remdesivir in infusion clinic.  He will completed tomorrow.  If he is able to maintain oxygen saturation in less than 6 L of oxygen, we can discharge him home  tomorrow    Dispo: [facility] [name] is from: Home              Anticipated d/c is to: Home              Anticipated d/c date is: Hopefully tomorrow              [name] currently is not medically stable to d/c.    Infusions:  . remdesivir 100 mg in NS 100 mL Stopped (06/09/20 1728)    Scheduled Meds: . azithromycin  500 mg Oral QHS  . baricitinib  4 mg Oral Daily  . enoxaparin (LOVENOX) injection  40 mg Subcutaneous Q24H  . gabapentin  300 mg Oral BID  . Ipratropium-Albuterol  1 puff Inhalation Q6H WA  . methylPREDNISolone (SOLU-MEDROL) injection  40 mg Intravenous Q8H  . pantoprazole  40 mg Oral Daily  . rosuvastatin  10 mg Oral Daily    Antimicrobials: Anti-infectives (From admission, onward)  Start     Dose/Rate Route Frequency Ordered Stop   06/09/20 2200  azithromycin (ZITHROMAX) tablet 500 mg        500 mg Oral Daily at bedtime 06/09/20 1214     06/09/20 1000  remdesivir 100 mg in sodium chloride 0.9 % 100 mL IVPB       "Followed by" Linked Group Details   100 mg 200 mL/hr over 30 Minutes Intravenous Daily 06/08/20 2135 06/13/20 0959   06/09/20 0200  azithromycin (ZITHROMAX) 500 mg in sodium chloride 0.9 % 250 mL IVPB  Status:  Discontinued        500 mg 250 mL/hr over 60 Minutes Intravenous Daily at bedtime 06/08/20 2307 06/09/20 1214   06/08/20 2200  remdesivir 200 mg in sodium chloride 0.9% 250 mL IVPB       "Followed by" Linked Group Details   200 mg 580 mL/hr over 30 Minutes Intravenous Once 06/08/20 2135 06/09/20 0011      PRN meds: acetaminophen **OR** acetaminophen, albuterol, hydrALAZINE, naLOXone (NARCAN)  injection, nicotine, oxyCODONE   Objective: Vitals:   06/10/20 0013 06/10/20 0523  BP: (!) 114/53 131/88  Pulse: 96 63  Resp:    Temp: 98.8 F (37.1 C) 98.2 F (36.8 C)  SpO2: 100% 100%    Intake/Output Summary (Last 24 hours) at 06/10/2020 0740 Last data filed at 06/10/2020 0000 Gross per 24 hour  Intake 100 ml  Output --  Net 100 ml    Filed Weights   06/09/20 1914  Weight: 68 kg   Weight change:  Body mass index is 24.96 kg/m.   Physical Exam: General exam: Appears calm and comfortable.  Not in physical distress Skin: No rashes, lesions or ulcers. HEENT: Atraumatic, normocephalic, supple neck, no obvious bleeding Lungs: Diminished air entry in both bases CVS: Regular rate and rhythm, no murmur GI/Abd soft, nontender, nondistended, bowel sound present CNS: Alert, awake monitor x3 Psychiatry: Mood appropriate Extremities: No pedal edema, no calf tenderness  Data Review: I have personally reviewed [facility] laboratory data and studies available.  Recent Labs  Lab 06/08/20 1831 06/09/20 0441 06/10/20 0438  WBC 16.7* 13.3* 7.4  NEUTROABS 15.1* 12.2*  --   HGB 9.4* 9.6* 8.4*  HCT 29.5* 30.3* 26.4*  MCV 93.4 93.8 93.6  PLT 236 209 230   Recent Labs  Lab 06/08/20 1831 06/08/20 2121 06/09/20 0441 06/10/20 0438  NA 134*  --  138 134*  K 4.2  --  3.7 3.9  CL 91*  --  93* 92*  CO2 31  --  31 31  GLUCOSE 107*  --  140* 143*  BUN 24*  --  24* 37*  CREATININE 1.14  --  1.05 1.10  CALCIUM 8.0*  --  8.3* 8.2*  MG  --  2.8* 2.4  --   PHOS  --   --  3.5  --    No results found for: HGBA1C     Component Value Date/Time   TRIG 102 06/08/2020 2121   Signed, Terrilee Croak, MD [facility] Pager: [phone] (Secure Chat preferred). 06/10/2020

## 2020-06-11 ENCOUNTER — Ambulatory Visit: Payer: Medicare Other

## 2020-06-11 ENCOUNTER — Inpatient Hospital Stay (HOSPITAL_COMMUNITY): Payer: Medicare Other

## 2020-06-11 LAB — COMPREHENSIVE METABOLIC PANEL
ALT: 25 U/L (ref 0–44)
AST: 38 U/L (ref 15–41)
Albumin: 2.5 g/dL — ABNORMAL LOW (ref 3.5–5.0)
Alkaline Phosphatase: 57 U/L (ref 38–126)
Anion gap: 15 (ref 5–15)
BUN: 34 mg/dL — ABNORMAL HIGH (ref 8–23)
CO2: 31 mmol/L (ref 22–32)
Calcium: 8.8 mg/dL — ABNORMAL LOW (ref 8.9–10.3)
Chloride: 95 mmol/L — ABNORMAL LOW (ref 98–111)
Creatinine, Ser: 0.88 mg/dL (ref 0.61–1.24)
GFR calc Af Amer: >60 mL/min (ref >60)
GFR calc non Af Amer: >60 mL/min (ref >60)
Glucose, Bld: 134 mg/dL — ABNORMAL HIGH (ref 70–99)
Potassium: 4 mmol/L (ref 3.5–5.1)
Sodium: 141 mmol/L (ref 135–145)
Total Bilirubin: 0.5 mg/dL (ref 0.3–1.2)
Total Protein: 6 g/dL — ABNORMAL LOW (ref 6.5–8.1)

## 2020-06-11 LAB — CBC
HCT: 27.9 % — ABNORMAL LOW (ref 39.0–52.0)
Hemoglobin: 8.5 g/dL — ABNORMAL LOW (ref 13.0–17.0)
MCH: 29.1 pg (ref 26.0–34.0)
MCHC: 30.5 g/dL (ref 30.0–36.0)
MCV: 95.5 fL (ref 80.0–100.0)
Platelets: 244 10*3/uL (ref 150–400)
RBC: 2.92 MIL/uL — ABNORMAL LOW (ref 4.22–5.81)
RDW: 13.6 % (ref 11.5–15.5)
WBC: 6.9 10*3/uL (ref 4.0–10.5)
nRBC: 0 % (ref 0.0–0.2)

## 2020-06-11 LAB — C-REACTIVE PROTEIN: CRP: 11.5 mg/dL — ABNORMAL HIGH (ref <1.0)

## 2020-06-11 LAB — FERRITIN: Ferritin: 219 ng/mL (ref 24–336)

## 2020-06-11 LAB — D-DIMER, QUANTITATIVE: D-Dimer, Quant: 0.79 ug/mL-FEU — ABNORMAL HIGH (ref 0.00–0.50)

## 2020-06-11 MED ORDER — FUROSEMIDE 10 MG/ML IJ SOLN
40.0000 mg | Freq: Once | INTRAMUSCULAR | Status: AC
  Administered 2020-06-11: 40 mg via INTRAVENOUS
  Filled 2020-06-11: qty 4

## 2020-06-11 MED ORDER — MELATONIN 3 MG PO TABS
3.0000 mg | ORAL_TABLET | Freq: Every day | ORAL | Status: DC
  Administered 2020-06-11: 3 mg via ORAL
  Filled 2020-06-11: qty 1

## 2020-06-11 NOTE — Progress Notes (Signed)
PROGRESS NOTE  Douglas [name]  DOB: 1956/09/23  PCP: Patient, No Pcp Per ALP:[id number]  DOA: 06/08/2020  LOS: 3 days   Chief Complaint  Patient presents with  . hypoxia  . Shortness of Breath   Brief narrative: Douglas [name] is a 64 y.o. male with PMH of severe COPD, recently diagnosed lung cancer on 2 L oxygen at home, continues to smoke, also with history of hypertension, hyperlipidemia. Patient presented to the ED on 06/08/2020 with 2 days of progressive shortness of breath, fatigue, generalized weakness, associated wheezing.   Patient has been living with his daughter.  1 week prior to presentation, patient's daughter tested positive for COVID-19.  Subsequently patient also started getting symptoms in few days.  EMS found his O2 sats to be in 67s, placed on nonrebreather and brought to the ED  In ED, COVID-19 positive, BNP 79 Chest x-ray did not show acute pneumonia. Patient was admitted for further evaluation and management.  Subjective: Patient was seen and examined this morning. Wants to go home but requiring more than 6 L oxygen on ambulation inside the room.  Assessment/Plan: COVID pneumonia Acute on chronic respiratory failure with hypoxia  -Presented with shortness of breath, hypoxia  -COVID test: PCR positive on admission -Chest imaging: Chest today did not show any acute infiltrates.  -Treatment: Although patient has no infiltrates on chest x-ray, he has clear clinical evidence of Covid pneumonia based on positive result, elevated CRP, D-dimer and hypoxia. He has significantly impaired lung function with pre-existing COPD, lung cancer and is at high risk of deterioration. So, he was started on aggressive treatment for Covid with IV remdesivir, IV steroids and baricitinib. -Completed IV remdesivir today.  Currently on IV Solu-Medrol 40 mg every 8 hours as well as baricitinib 4 mg daily.   -Oxygen - SpO2: 100 % O2 Flow Rate (L/min): 7 L/min  -Progression: Patient  desperately wants to go home.  CRP is improving. His oxygenation is poor however.  He is currently on 7 L by high flow nasal cannula.  -Continue IV steroids, baricitinib.  I will also try IV Lasix 40 mg today. -Protonix while on steroids. -Supportive care: Vitamin C, Zinc, PRN inhalers, Tylenol, Antitussives (benzonatate/ Mucinex/Tussionex).   -Encouraged incentive spirometry, prone position, out of bed and early mobilization as much as possible -Continue airborne/contact isolation precautions for duration of 3 weeks from the day of diagnosis. -WBC and inflammatory markers trend as below.  Lab Results  Component Value Date   SARSCOV2NAA POSITIVE (A) 06/08/2020   [place] NEGATIVE 05/16/2020    Recent Labs  Lab 06/08/20 1831 06/08/20 2121 06/08/20 2245 06/09/20 0441 06/10/20 0438 06/11/20 0338  WBC 16.7*  --   --  13.3* 7.4 6.9  LATICACIDVEN  --   --  1.1  --   --   --   PROCALCITON  --  2.29  --   --   --   --   DDIMER  --  1.11*  --  1.07* 0.90* 0.79*  FERRITIN  --  269  --  260 251 219  LDH  --  319*  --  286*  --   --   CRP  --  30.9*  --  29.6* 18.3* 11.5*  ALT 25  --   --  25 25 25    The treatment plan and use of medications and known side effects were discussed with patient/family. Some of the medications used are based on case reports/anecdotal data.  All other medications  being used in the management of COVID-19 based on limited study data.  Complete risks and long-term side effects are unknown, however in the best clinical judgment they seem to be of some benefit.  Patient wanted to proceed with treatment options provided.  Right lung CA, right adrenal lesion, stage IV bronchogenic CA -Recently admitted (8/5-8/6) with dysphagia and unintentional weight loss. Work-up revealed right lung mass discerning for malignancy.  PET/CT on 8/11 showed right upper lobe mass with bilateral mediastinal, hilar lymph nodes, bilateral adrenal lesions, lytic osseous lesions concerning for  stage IV primary bronchogenic carcinoma.   -Followed by Dr[facility] Mike Gip at [facility], pending XRT.  Acute anemia -No active blood loss but his hemoglobin seems to be gradually dropping in last 1 month interval.  It could be secondary to malignancy. -Ferritin level is elevated this admission secondary to Covid. -Patient will follow up with hematology/oncologist as part of his cancer care. Recent Labs    05/16/20 0805 05/17/20 0511 06/08/20 1831 06/09/20 0441 06/10/20 0438 06/11/20 0338  HGB 13.1 11.4* 9.4* 9.6* 8.4* 8.5*   GERD -On PPI  Essential hypertension -Home meds include losartan/HCTZ -Currently on hold.  Continue monitor blood pressure.  IV hydralazine as needed.  Hyperlipidemia -Continue Crestor  Mobility: Encourage ambulation, out of bed Code Status:   Code Status: Full Code  Nutritional status: Body mass index is 24.96 kg/m.     Diet Order            Diet regular Room service appropriate? Yes; Fluid consistency: Thin  Diet effective now                 DVT prophylaxis: enoxaparin (LOVENOX) injection 40 mg Start: 06/09/20 1000 SCDs Start: 06/08/20 2258   Antimicrobials:  None Fluid: None   Consultants: None Family Communication: : Called and updated patient's daughter this afternoon.  Status is: Inpatient  Remains inpatient appropriate because: Is still requiring significant level of oxygen supplementation.  He is at high risk of deterioration because of pre-existing lung conditions.  Dispo: The patient is from: Home              Anticipated d/c is to: Home              Anticipated d/c date is: Next 1 to 2 days.              Patient currently is not medically stable to d/c.   Infusions:  . remdesivir 100 mg in NS 100 mL 100 mg (06/11/20 0855)    Scheduled Meds: . azithromycin  500 mg Oral QHS  . baricitinib  4 mg Oral Daily  . enoxaparin (LOVENOX) injection  40 mg Subcutaneous Q24H  . gabapentin  300 mg Oral BID  . Ipratropium-Albuterol  1  puff Inhalation Q6H WA  . methylPREDNISolone (SOLU-MEDROL) injection  40 mg Intravenous Q8H  . pantoprazole  40 mg Oral Daily  . rosuvastatin  10 mg Oral Daily    Antimicrobials: Anti-infectives (From admission, onward)   Start     Dose/Rate Route Frequency Ordered Stop   06/09/20 2200  azithromycin (ZITHROMAX) tablet 500 mg        500 mg Oral Daily at bedtime 06/09/20 1214     06/09/20 1000  remdesivir 100 mg in sodium chloride 0.9 % 100 mL IVPB       "Followed by" Linked Group Details   100 mg 200 mL/hr over 30 Minutes Intravenous Daily 06/08/20 2135 06/13/20 0959   06/09/20 0200  azithromycin (  ZITHROMAX) 500 mg in sodium chloride 0.9 % 250 mL IVPB  Status:  Discontinued        500 mg 250 mL/hr over 60 Minutes Intravenous Daily at bedtime 06/08/20 2307 06/09/20 1214   06/08/20 2200  remdesivir 200 mg in sodium chloride 0.9% 250 mL IVPB       "Followed by" Linked Group Details   200 mg 580 mL/hr over 30 Minutes Intravenous Once 06/08/20 2135 06/09/20 0011      PRN meds: acetaminophen **OR** acetaminophen, albuterol, hydrALAZINE, naLOXone (NARCAN)  injection, nicotine, oxyCODONE   Objective: Vitals:   06/11/20 0425 06/11/20 1437  BP: (!) 144/66 (!) 153/93  Pulse: (!) 56 60  Resp: 19 20  Temp: 97.7 F (36.5 C) 98.6 F (37 C)  SpO2: 100% 100%    Intake/Output Summary (Last 24 hours) at 06/11/2020 1501 Last data filed at 06/11/2020 1456 Gross per 24 hour  Intake 1052 ml  Output 3775 ml  Net -2723 ml   Filed Weights   06/09/20 1914  Weight: 68 kg   Weight change:  Body mass index is 24.96 kg/m.   Physical Exam: General exam: Appears calm and comfortable.  Not in physical distress at rest Skin: No rashes, lesions or ulcers. HEENT: Atraumatic, normocephalic, supple neck, no obvious bleeding Lungs: Diminished air entry in both bases.  No crackles or wheezing CVS: Regular rate and rhythm, no murmur GI/Abd soft, nontender, nondistended, bowel sound present CNS:  Alert, awake monitor x3 Psychiatry: Frustrated because he cannot go home Extremities: No pedal edema, no calf tenderness  Data Review: I have personally reviewed the laboratory data and studies available.  Recent Labs  Lab 06/08/20 1831 06/09/20 0441 06/10/20 0438 06/11/20 0338  WBC 16.7* 13.3* 7.4 6.9  NEUTROABS 15.1* 12.2*  --   --   HGB 9.4* 9.6* 8.4* 8.5*  HCT 29.5* 30.3* 26.4* 27.9*  MCV 93.4 93.8 93.6 95.5  PLT 236 209 230 244   Recent Labs  Lab 06/08/20 1831 06/08/20 2121 06/09/20 0441 06/10/20 0438 06/11/20 0338  NA 134*  --  138 134* 141  K 4.2  --  3.7 3.9 4.0  CL 91*  --  93* 92* 95*  CO2 31  --  31 31 31   GLUCOSE 107*  --  140* 143* 134*  BUN 24*  --  24* 37* 34*  CREATININE 1.14  --  1.05 1.10 0.88  CALCIUM 8.0*  --  8.3* 8.2* 8.8*  MG  --  2.8* 2.4  --   --   PHOS  --   --  3.5  --   --    No results found for: HGBA1C     Component Value Date/Time   TRIG 102 06/08/2020 2121   Signed, Terrilee Croak, MD [facility] Pager: [phone] (Secure Chat preferred). 06/11/2020

## 2020-06-11 NOTE — Progress Notes (Signed)
PT Cancellation Note  Patient Details Name: Douglas [name] MRN: [id number] DOB: 04/18/1956   Cancelled Treatment:    Reason Eval/Treat Not Completed: Attempted PT eval-RN requested PT check back another time. Will check back on tomorrow.    [facility]  Office: [phone] Pager: [phone]

## 2020-06-11 NOTE — Care Management Important Message (Signed)
Important Message  Patient Details IM Letter given to the Patient Name: Douglas [name] MRN: [id number] Date of Birth: 1956-09-10   Medicare Important Message Given:  Yes     Kerin Salen 06/11/2020, 12:09 PM

## 2020-06-12 ENCOUNTER — Encounter: Payer: Self-pay | Admitting: Hematology and Oncology

## 2020-06-12 ENCOUNTER — Ambulatory Visit: Payer: Medicare Other

## 2020-06-12 LAB — COMPREHENSIVE METABOLIC PANEL
ALT: 27 U/L (ref 0–44)
AST: 32 U/L (ref 15–41)
Albumin: 2.7 g/dL — ABNORMAL LOW (ref 3.5–5.0)
Alkaline Phosphatase: 56 U/L (ref 38–126)
Anion gap: 13 (ref 5–15)
BUN: 29 mg/dL — ABNORMAL HIGH (ref 8–23)
CO2: 34 mmol/L — ABNORMAL HIGH (ref 22–32)
Calcium: 8.7 mg/dL — ABNORMAL LOW (ref 8.9–10.3)
Chloride: 95 mmol/L — ABNORMAL LOW (ref 98–111)
Creatinine, Ser: 0.91 mg/dL (ref 0.61–1.24)
GFR calc Af Amer: >60 mL/min (ref >60)
GFR calc non Af Amer: >60 mL/min (ref >60)
Glucose, Bld: 139 mg/dL — ABNORMAL HIGH (ref 70–99)
Potassium: 3.9 mmol/L (ref 3.5–5.1)
Sodium: 142 mmol/L (ref 135–145)
Total Bilirubin: 0.6 mg/dL (ref 0.3–1.2)
Total Protein: 6 g/dL — ABNORMAL LOW (ref 6.5–8.1)

## 2020-06-12 LAB — CBC
HCT: 28.7 % — ABNORMAL LOW (ref 39.0–52.0)
Hemoglobin: 8.8 g/dL — ABNORMAL LOW (ref 13.0–17.0)
MCH: 28.8 pg (ref 26.0–34.0)
MCHC: 30.7 g/dL (ref 30.0–36.0)
MCV: 93.8 fL (ref 80.0–100.0)
Platelets: 268 10*3/uL (ref 150–400)
RBC: 3.06 MIL/uL — ABNORMAL LOW (ref 4.22–5.81)
RDW: 13.3 % (ref 11.5–15.5)
WBC: 6.1 10*3/uL (ref 4.0–10.5)
nRBC: 0 % (ref 0.0–0.2)

## 2020-06-12 LAB — FERRITIN: Ferritin: 152 ng/mL (ref 24–336)

## 2020-06-12 LAB — D-DIMER, QUANTITATIVE: D-Dimer, Quant: 0.72 ug/mL-FEU — ABNORMAL HIGH (ref 0.00–0.50)

## 2020-06-12 LAB — C-REACTIVE PROTEIN: CRP: 7.8 mg/dL — ABNORMAL HIGH (ref <1.0)

## 2020-06-12 MED ORDER — DEXAMETHASONE 6 MG PO TABS: 6.0000 mg | ORAL_TABLET | Freq: Every day | ORAL | 0 refills | Status: AC

## 2020-06-12 MED ORDER — NICOTINE 14 MG/24HR TD PT24: 14.0000 mg | MEDICATED_PATCH | Freq: Every day | TRANSDERMAL | 0 refills | Status: AC | PRN

## 2020-06-12 NOTE — Discharge Summary (Addendum)
Physician Discharge Summary  Douglas [name] LGX:[id number] DOB: 05/09/1956 DOA: 06/08/2020  PCP: Patient, No Pcp Per  Admit date: 06/08/2020 Discharge date: 06/12/2020  Admitted From: Home Disposition: Home with home health  Recommendations for Outpatient Follow-up:  1. Follow up with PCP in 1-2 weeks 2. Follow-up with your cancer doctor  Home Health: Physical therapy Equipment/Devices: Oxygen  Discharge Condition: Fair CODE STATUS: Full code Diet recommendation: Regular diet  Discharge summary: 64 year old male with baseline severe COPD and chronic hypoxic failure on 2 L oxygen at home, recently diagnosed lung cancer metastatic under treatment at [facility], hypertension hyperlipidemia presented to the ER on 8/28 with 2 days of progressive shortness of breath fatigue and weakness associated with wheezing.  Lives with daughter who is also diagnosed with COVID-19.  In the emergency room COVID-19 positive.  Admitted with increasing oxygen requirement than home.  Patient was admitted to the hospital and treated with increasing oxygen, high-dose IV steroids, remdesivir for 5 days as well as baricitinib.  He did do good clinical recovery.  He has baseline poor pulmonary function and is still on 5 to 6 L of oxygen however he subjectively feels better.  His inflammatory markers are better. Patient has finished 5 days of remdesivir therapy, finished high-dose steroid therapy and is wishing to go home.  His shortness of breath is multifactorial with baseline severe COPD and lung cancer.  Patient is currently requiring 5 L oxygen, however he is comfortable and able to manage symptoms at home and lives with his daughter.  Plan: Discharged home on request with clinical stabilization. We will continue dexamethasone 6 mg daily for 1 week.  This will help with his COPD and hypoxia. Over-the-counter cough medications. Increasing oxygen requirement 5 L/min and physical therapy at home to help with  mobility and developing endurance. Continue all other treatment plan along with oncology plans.  Clarification for documentation purpose: Sepsis ruled out.  Discharge Diagnoses:  Principal Problem:   Acute on chronic respiratory failure with hypoxia (HCC) Active Problems:   Hypertension   Hyperlipidemia   COPD (chronic obstructive pulmonary disease) ([place])   COVID-19 virus infection   Severe sepsis ([place])   Lung cancer [facility])    Discharge Instructions  Discharge Instructions    Call MD for:  difficulty breathing, headache or visual disturbances   Complete by: As directed    Call MD for:  temperature >100.4   Complete by: As directed    Diet general   Complete by: As directed    Increase activity slowly   Complete by: As directed      Allergies as of 06/12/2020   No Known Allergies     Medication List    STOP taking these medications   LORazepam 0.5 MG tablet Commonly known as: ATIVAN   mometasone-formoterol 200-5 MCG/ACT Aero Commonly known as: DULERA   naloxone 4 MG/0.1ML Liqd nasal spray kit Commonly known as: NARCAN   predniSONE 10 MG (21) Tbpk tablet Commonly known as: STERAPRED UNI-PAK 21 TAB   rosuvastatin 10 MG tablet Commonly known as: CRESTOR   tamsulosin 0.4 MG Caps capsule Commonly known as: FLOMAX     TAKE these medications   albuterol 108 (90 Base) MCG/ACT inhaler Commonly known as: VENTOLIN HFA Inhale 1-2 puffs into the lungs every 6 (six) hours as needed for wheezing or shortness of breath. What changed: Another medication with the same name was removed. Continue taking this medication, and follow the directions you see here.   Breztri Aerosphere  160-9-4.8 MCG/ACT Aero Generic drug: Budeson-Glycopyrrol-Formoterol Inhale 2 puffs into the lungs in the morning and at bedtime.   dexamethasone 6 MG tablet Commonly known as: Decadron Take 1 tablet (6 mg total) by mouth daily for 6 days.   gabapentin 300 MG capsule Commonly known as:  NEURONTIN Take 300 mg by mouth 2 (two) times daily.   losartan-hydrochlorothiazide 50-12.5 MG tablet Commonly known as: HYZAAR Take 1 tablet by mouth daily.   nicotine 14 mg/24hr patch Commonly known as: NICODERM CQ - dosed in mg/24 hours Place 1 patch (14 mg total) onto the skin daily as needed for up to 14 days (smoking cessation).   oxyCODONE 5 MG immediate release tablet Commonly known as: Oxy IR/ROXICODONE Take 1-2 tablets (5-10 mg total) by mouth every 4 (four) hours as needed for severe pain.   oxyCODONE 10 mg 12 hr tablet Commonly known as: OXYCONTIN Take 1 tablet (10 mg total) by mouth every 8 (eight) hours.   pantoprazole 40 MG tablet Commonly known as: Protonix Take 1 tablet (40 mg total) by mouth daily.   sucralfate 1 g tablet Commonly known as: Carafate Take 1 tablet (1 g total) by mouth 4 (four) times daily -  with meals and at bedtime for 7 days.            Durable Medical Equipment  (From admission, onward)         Start     Ordered   06/12/20 1149  DME Oxygen  Once       Question Answer Comment  Length of Need Lifetime   Mode or (Route) Nasal cannula   Liters per Minute 5   Frequency Continuous (stationary and portable oxygen unit needed)   Oxygen conserving device Yes   Oxygen delivery system Gas      06/12/20 1148          No Known Allergies  Consultations:  None   Procedures/Studies: DG Chest 2 View  Result Date: 05/16/2020 CLINICAL DATA:  Dysphagia, chest pain EXAM: CHEST - 2 VIEW COMPARISON:  05/02/2020 small FINDINGS: The heart size and mediastinal contours are within normal limits. Atherosclerotic calcification of the aortic knob. Mildly hyperinflated lungs. Streaky interstitial opacity within the right upper lobe. No pleural effusion or pneumothorax. The visualized skeletal structures are unremarkable. IMPRESSION: 1. Streaky interstitial opacity within the right upper lobe suspicious for developing infection. 2. Mildly  hyperinflated lungs, suggesting underlying COPD. Electronically Signed   By: [name] D.O.   On: 05/16/2020 08:25   CT Angio Chest PE W and/or Wo Contrast  Result Date: 05/16/2020 CLINICAL DATA:  Concern for pill stuck in throat for 2 days, now with pain with swallowing EXAM: CT ANGIOGRAPHY CHEST WITH CONTRAST TECHNIQUE: Multidetector CT imaging of the chest was performed using the standard protocol during bolus administration of intravenous contrast. Multiplanar CT image reconstructions and MIPs were obtained to evaluate the vascular anatomy. CONTRAST:  74mL OMNIPAQUE IOHEXOL 350 MG/ML SOLN COMPARISON:  Radiograph 05/16/2020 FINDINGS: Cardiovascular: Satisfactory opacification the pulmonary arteries to the segmental level. No pulmonary artery filling defects are identified. Central pulmonary arteries are normal caliber. Atherosclerotic plaque within the normal caliber aorta. No acute luminal abnormality nor periaortic stranding or hemorrhage. Normal 3 vessel branching of the aortic arch. Atheromatous plaque in the otherwise normal proximal great vessels. Normal heart size. No pericardial effusion. Coronary artery atherosclerosis is present. Mediastinum/Nodes: No mediastinal fluid or gas. Normal thyroid gland and thoracic inlet. No acute abnormality of the trachea. Diffuse circumferential thickening  of the thoracic esophagus without para esophageal fluid, gas or adenopathy. No worrisome mediastinal, hilar or axillary adenopathy. Lungs/Pleura: Corresponding to the streaky opacity present in the right upper lobe on comparison chest radiograph spiculated nodule in the right lung apex measuring 2.1 x 1.7 cm in size highly conspicuous for a bronchogenic carcinoma. Additional subpleural 3 mm nodule in the posterior segment right upper lobe (5/34). 2 mm nodule along the left major fissure in the left upper lobe (5/44). No other conspicuous nodules or masses. Background of centrilobular and paraseptal emphysema  in the lungs. No consolidation, features of edema, pneumothorax, or effusion. Upper Abdomen: 2.7 x 1.5 cm heterogeneously enhancing which appears to arise from the adrenal gland concerning for potential metastatic disease. No other acute upper abdominal abnormality. Musculoskeletal: The osseous structures appear diffusely demineralized which may limit detection of small or nondisplaced fractures. No acute osseous abnormality or suspicious osseous lesion. Review of the MIP images confirms the above findings. IMPRESSION: 1. No evidence of pulmonary embolism. 2. Corresponding to the streaky opacity present in the right upper lobe on comparison chest radiograph, spiculated nodule in the right lung apex measuring 2.1 x 1.7 cm in size highly conspicuous for a bronchogenic carcinoma. Consider referral to multi disciplinary case conference for further management decisions. 3. 2.7 x 1.5 cm heterogeneously enhancing which appears to arise from the right adrenal gland concerning for potential metastatic disease. 4. Diffuse circumferential thickening of the thoracic esophagus, likely to correlate with patient's symptoms of esophagitis. 5. Aortic Atherosclerosis (ICD10-I70.0). 6. 7. These results were called by telephone at the time of interpretation on 05/16/2020 at 3:10 pm to provider [name] , who verbally acknowledged these results. Electronically Signed   By: Lovena Le M.D.   On: 05/16/2020 15:10   MR BRAIN W WO CONTRAST  Result Date: 05/29/2020 CLINICAL DATA:  Neoplasm, staging. EXAM: MRI HEAD WITHOUT AND WITH CONTRAST MRI CERVICAL SPINE WITHOUT AND WITH CONTRAST MRI LUMBAR SPINE WITHOUT AND WITH CONTRAST TECHNIQUE: Multiplanar, multiecho pulse sequences of the brain and surrounding structures, and cervical spine, to include the craniocervical junction and cervicothoracic junction, were obtained without and with intravenous contrast. Multiplanar and multiecho pulse sequences of the lumbar spine were obtained  without and with intravenous contrast. CONTRAST:  13mL GADAVIST GADOBUTROL 1 MMOL/ML IV SOLN COMPARISON:  05/02/2020 lumbar spine radiographs. 05/22/2020 nuclear medicine PET scan. FINDINGS: MRI HEAD FINDINGS Brain: Scattered T2 hyperintense periventricular and subcortical white matter foci are nonspecific however commonly associated with chronic microvascular ischemic changes. Mild cerebral atrophy with ex vacuo dilatation. No acute infarct or intracranial hemorrhage. Tiny left temporal remote microhemorrhage. Sequela of remote left thalamic and right midbrain insults. No midline shift, ventriculomegaly or extra-axial fluid collection. Normal appearance of the sella turcica and pituitary gland. -3 mm inferior right cerebellar enhancing focus (18:14). -punctate medial left cerebellar enhancement (18:34). Vascular: Major intracranial flow voids are preserved. Skull and upper cervical spine: 2 mm right parietal enhancing focus (18:120). Scattered enhancing foci measuring up to 4 mm within the basilar and anterolateral occipital bone (18:24). Sinuses/Orbits: Normal orbits. Minimal ethmoid and left maxillary sinus mucosal thickening. Trace right mastoid effusion. Other: 6 mm nonenhancing diffusion restricting right occipital scalp nodule (5:16), likely an epidermal inclusion cyst. MRI CERVICAL SPINE FINDINGS Alignment: Normal. Vertebrae: Diffusely T1 hypointense appearance of the bone marrow. Vertebral body heights are preserved. Bone marrow edema involving the left C4-5 and C5-6 facet joints. Cord: Normal signal intensity morphology.  No abnormal enhancement. Posterior Fossa, vertebral arteries: Please see MRI  brain. Disc levels: Multilevel desiccation.  Mild C5-6 disc space loss. C2-3: No significant disc bulge, spinal canal or neural foraminal narrowing. C3-4: Small central protrusion with uncovertebral and facet hypertrophy. Patent spinal canal and left neural foramen. Mild right neural foraminal narrowing. C4-5:  There is ill-defined enhancing soft tissue encasing the left C4-5 facet joint measuring 2.0 x 1.6 cm (12:16). The soft tissue extends into the left neural and transverse foramen, encasing the left vertebral artery (12:15). Left predominant and dorsal C5 vertebral body erosions. Prevertebral soft tissue thickening and enhancement is concerning for extension of tumor. There is also ill-defined enhancement along the right anterolateral C5 vertebral body (12:15). Inflammatory changes are seen within the left paraspinal region (11:13). Circumferential soft tissue thickening and enhancement involving the epidural space at the C4-5 level. Small disc osteophyte complex with uncovertebral and right facet hypertrophy. Mild spinal canal narrowing. Patent right neural foramen. Moderate left neural foraminal narrowing secondary to soft tissue encroachment. C5-6: There is inferior extension of ill-defined soft tissue along the left transverse/neural foramen and anterior left facet joint (12:12) left facet joint bone marrow edema. Prominence of mild enhancement involving the epidural space. Disc osteophyte complex with uncovertebral and facet hypertrophy. Mild spinal canal and right neural foraminal narrowing. Soft tissue encroachment narrows the left neural foramen. C6-7: Disc osteophyte complex with uncovertebral and facet hypertrophy. Patent spinal canal and bilateral neural foramina. C7-T1: Disc osteophyte complex with uncovertebral and facet hypertrophy. Patent spinal canal and neural foramen. Paraspinal tissues: As detailed above. MRI LUMBAR SPINE FINDINGS Segmentation:  Standard. Alignment:  Normal. Vertebrae: Diffuse bone marrow heterogeneity. Multilevel osteophytosis. Metastases are detailed below. Conus medullaris and cauda equina: Conus extends to the L2 level. Conus and cauda equina appear normal. Filar lipoma. Disc levels: Multilevel desiccation and disc space loss most prominent at the L5-S1 level. L1-2: No  significant disc bulge, spinal canal or neural foraminal narrowing. L2-3: Disc bulge, ligamentum flavum and bilateral facet hypertrophy. Mild spinal canal and bilateral neural foraminal narrowing. L3-4: L3 superior endplate pathologic fracture deformity with approximately 10-20% height loss. 4.7 x 3.4 cm enhancing metastatic lesion involving the right L3 vertebral body and posterior elements (13:19). There is extension of enhancing tumor into the prevertebral and anterior epidural spaces at the L3 level (13:20, 8:19) effacing the lateral recesses and abutting the exiting bilateral L3 nerve roots. Ligamentum flavum and bilateral facet hypertrophy. Mild spinal canal and bilateral neural foraminal narrowing. L4-5: Disc bulge abutting the exiting left L4 nerve root, ligamentum flavum and bilateral facet hypertrophy. Mild spinal canal and moderate bilateral neural foraminal narrowing. Abnormal right L4-5 neural foramen soft tissue is discussed below. L5-S1: Disc bulge with superimposed central protrusion abutting the descending bilateral S1 nerve roots, ligamentum flavum and bilateral facet hypertrophy. Patent spinal canal. Mild bilateral neural foraminal narrowing. Paraspinal and other soft tissues: At the level of the lumbosacral junction and extending along the right sacroiliac joint there is enhancing soft tissue measuring 4.2 x 2.6 cm (13:34). Soft tissue extends into the right L4-5 neural foramen (8:31) there are adjacent erosions involving the right lateral L5 vertebral body and superior right sacral ala. Anterior cortical defect is concerning for an associated pathologic fracture (8:36). IMPRESSION: MRI brain: -Sub-5 mm bilateral cerebellar enhancing foci are concerning for metastases. -Sub-5 mm right parietal bone and basilar occipital enhancing foci, concerning for osseous metastases. -Sequela of remote left thalamic and right midbrain insults. Mild cerebral atrophy and chronic microvascular ischemic changes.  MRI cervical spine: -Metastatic soft tissue involving the left  C5-6 facet joint and right anterolateral C5 vertebral body. There is extension of tumor into the prevertebral and circumferential epidural space at the C5-6 level without significant spinal canal narrowing. -Tumor also extends to involve the left C5-6 and C6-7 transverse and neural foramen with partial encasement of the vertebral artery. -No evidence of spinal cord enhancement. MRI lumbar spine: - L3 metastatic lesion and pathologic fracture with approximately 20% height loss. There is extension of tumor into the prevertebral and anterior epidural space at the L3 level with mild spinal canal and bilateral neural foraminal narrowing. - Partially imaged 4.2 cm right lumbosacral junction/SI joint metastasis with extension of tumor into the right L4-5 neural foramen. Query associated pathologic fracture. - Please see concurrent MR sacrum for better characterization. These results will be called to the ordering clinician or representative by the Radiologist Assistant, and communication documented in the PACS or [employer]. Electronically Signed   By: Primitivo Gauze M.D.   On: 05/29/2020 14:49   MR Cervical Spine W Wo Contrast  Result Date: 05/29/2020 CLINICAL DATA:  Neoplasm, staging. EXAM: MRI HEAD WITHOUT AND WITH CONTRAST MRI CERVICAL SPINE WITHOUT AND WITH CONTRAST MRI LUMBAR SPINE WITHOUT AND WITH CONTRAST TECHNIQUE: Multiplanar, multiecho pulse sequences of the brain and surrounding structures, and cervical spine, to include the craniocervical junction and cervicothoracic junction, were obtained without and with intravenous contrast. Multiplanar and multiecho pulse sequences of the lumbar spine were obtained without and with intravenous contrast. CONTRAST:  31mL GADAVIST GADOBUTROL 1 MMOL/ML IV SOLN COMPARISON:  05/02/2020 lumbar spine radiographs. 05/22/2020 nuclear medicine PET scan. FINDINGS: MRI HEAD FINDINGS Brain: Scattered T2  hyperintense periventricular and subcortical white matter foci are nonspecific however commonly associated with chronic microvascular ischemic changes. Mild cerebral atrophy with ex vacuo dilatation. No acute infarct or intracranial hemorrhage. Tiny left temporal remote microhemorrhage. Sequela of remote left thalamic and right midbrain insults. No midline shift, ventriculomegaly or extra-axial fluid collection. Normal appearance of the sella turcica and pituitary gland. -3 mm inferior right cerebellar enhancing focus (18:14). -punctate medial left cerebellar enhancement (18:34). Vascular: Major intracranial flow voids are preserved. Skull and upper cervical spine: 2 mm right parietal enhancing focus (18:120). Scattered enhancing foci measuring up to 4 mm within the basilar and anterolateral occipital bone (18:24). Sinuses/Orbits: Normal orbits. Minimal ethmoid and left maxillary sinus mucosal thickening. Trace right mastoid effusion. Other: 6 mm nonenhancing diffusion restricting right occipital scalp nodule (5:16), likely an epidermal inclusion cyst. MRI CERVICAL SPINE FINDINGS Alignment: Normal. Vertebrae: Diffusely T1 hypointense appearance of the bone marrow. Vertebral body heights are preserved. Bone marrow edema involving the left C4-5 and C5-6 facet joints. Cord: Normal signal intensity morphology.  No abnormal enhancement. Posterior Fossa, vertebral arteries: Please see MRI brain. Disc levels: Multilevel desiccation.  Mild C5-6 disc space loss. C2-3: No significant disc bulge, spinal canal or neural foraminal narrowing. C3-4: Small central protrusion with uncovertebral and facet hypertrophy. Patent spinal canal and left neural foramen. Mild right neural foraminal narrowing. C4-5: There is ill-defined enhancing soft tissue encasing the left C4-5 facet joint measuring 2.0 x 1.6 cm (12:16). The soft tissue extends into the left neural and transverse foramen, encasing the left vertebral artery (12:15). Left  predominant and dorsal C5 vertebral body erosions. Prevertebral soft tissue thickening and enhancement is concerning for extension of tumor. There is also ill-defined enhancement along the right anterolateral C5 vertebral body (12:15). Inflammatory changes are seen within the left paraspinal region (11:13). Circumferential soft tissue thickening and enhancement involving the epidural space  at the C4-5 level. Small disc osteophyte complex with uncovertebral and right facet hypertrophy. Mild spinal canal narrowing. Patent right neural foramen. Moderate left neural foraminal narrowing secondary to soft tissue encroachment. C5-6: There is inferior extension of ill-defined soft tissue along the left transverse/neural foramen and anterior left facet joint (12:12) left facet joint bone marrow edema. Prominence of mild enhancement involving the epidural space. Disc osteophyte complex with uncovertebral and facet hypertrophy. Mild spinal canal and right neural foraminal narrowing. Soft tissue encroachment narrows the left neural foramen. C6-7: Disc osteophyte complex with uncovertebral and facet hypertrophy. Patent spinal canal and bilateral neural foramina. C7-T1: Disc osteophyte complex with uncovertebral and facet hypertrophy. Patent spinal canal and neural foramen. Paraspinal tissues: As detailed above. MRI LUMBAR SPINE FINDINGS Segmentation:  Standard. Alignment:  Normal. Vertebrae: Diffuse bone marrow heterogeneity. Multilevel osteophytosis. Metastases are detailed below. Conus medullaris and cauda equina: Conus extends to the L2 level. Conus and cauda equina appear normal. Filar lipoma. Disc levels: Multilevel desiccation and disc space loss most prominent at the L5-S1 level. L1-2: No significant disc bulge, spinal canal or neural foraminal narrowing. L2-3: Disc bulge, ligamentum flavum and bilateral facet hypertrophy. Mild spinal canal and bilateral neural foraminal narrowing. L3-4: L3 superior endplate pathologic  fracture deformity with approximately 10-20% height loss. 4.7 x 3.4 cm enhancing metastatic lesion involving the right L3 vertebral body and posterior elements (13:19). There is extension of enhancing tumor into the prevertebral and anterior epidural spaces at the L3 level (13:20, 8:19) effacing the lateral recesses and abutting the exiting bilateral L3 nerve roots. Ligamentum flavum and bilateral facet hypertrophy. Mild spinal canal and bilateral neural foraminal narrowing. L4-5: Disc bulge abutting the exiting left L4 nerve root, ligamentum flavum and bilateral facet hypertrophy. Mild spinal canal and moderate bilateral neural foraminal narrowing. Abnormal right L4-5 neural foramen soft tissue is discussed below. L5-S1: Disc bulge with superimposed central protrusion abutting the descending bilateral S1 nerve roots, ligamentum flavum and bilateral facet hypertrophy. Patent spinal canal. Mild bilateral neural foraminal narrowing. Paraspinal and other soft tissues: At the level of the lumbosacral junction and extending along the right sacroiliac joint there is enhancing soft tissue measuring 4.2 x 2.6 cm (13:34). Soft tissue extends into the right L4-5 neural foramen (8:31) there are adjacent erosions involving the right lateral L5 vertebral body and superior right sacral ala. Anterior cortical defect is concerning for an associated pathologic fracture (8:36). IMPRESSION: MRI brain: -Sub-5 mm bilateral cerebellar enhancing foci are concerning for metastases. -Sub-5 mm right parietal bone and basilar occipital enhancing foci, concerning for osseous metastases. -Sequela of remote left thalamic and right midbrain insults. Mild cerebral atrophy and chronic microvascular ischemic changes. MRI cervical spine: -Metastatic soft tissue involving the left C5-6 facet joint and right anterolateral C5 vertebral body. There is extension of tumor into the prevertebral and circumferential epidural space at the C5-6 level without  significant spinal canal narrowing. -Tumor also extends to involve the left C5-6 and C6-7 transverse and neural foramen with partial encasement of the vertebral artery. -No evidence of spinal cord enhancement. MRI lumbar spine: - L3 metastatic lesion and pathologic fracture with approximately 20% height loss. There is extension of tumor into the prevertebral and anterior epidural space at the L3 level with mild spinal canal and bilateral neural foraminal narrowing. - Partially imaged 4.2 cm right lumbosacral junction/SI joint metastasis with extension of tumor into the right L4-5 neural foramen. Query associated pathologic fracture. - Please see concurrent MR sacrum for better characterization. These results will  be called to the ordering clinician or representative by the Radiologist Assistant, and communication documented in the PACS or [employer]. Electronically Signed   By: Primitivo Gauze M.D.   On: 05/29/2020 14:49   MR Lumbar Spine W Wo Contrast  Result Date: 05/29/2020 CLINICAL DATA:  Neoplasm, staging. EXAM: MRI HEAD WITHOUT AND WITH CONTRAST MRI CERVICAL SPINE WITHOUT AND WITH CONTRAST MRI LUMBAR SPINE WITHOUT AND WITH CONTRAST TECHNIQUE: Multiplanar, multiecho pulse sequences of the brain and surrounding structures, and cervical spine, to include the craniocervical junction and cervicothoracic junction, were obtained without and with intravenous contrast. Multiplanar and multiecho pulse sequences of the lumbar spine were obtained without and with intravenous contrast. CONTRAST:  40mL GADAVIST GADOBUTROL 1 MMOL/ML IV SOLN COMPARISON:  05/02/2020 lumbar spine radiographs. 05/22/2020 nuclear medicine PET scan. FINDINGS: MRI HEAD FINDINGS Brain: Scattered T2 hyperintense periventricular and subcortical white matter foci are nonspecific however commonly associated with chronic microvascular ischemic changes. Mild cerebral atrophy with ex vacuo dilatation. No acute infarct or intracranial  hemorrhage. Tiny left temporal remote microhemorrhage. Sequela of remote left thalamic and right midbrain insults. No midline shift, ventriculomegaly or extra-axial fluid collection. Normal appearance of the sella turcica and pituitary gland. -3 mm inferior right cerebellar enhancing focus (18:14). -punctate medial left cerebellar enhancement (18:34). Vascular: Major intracranial flow voids are preserved. Skull and upper cervical spine: 2 mm right parietal enhancing focus (18:120). Scattered enhancing foci measuring up to 4 mm within the basilar and anterolateral occipital bone (18:24). Sinuses/Orbits: Normal orbits. Minimal ethmoid and left maxillary sinus mucosal thickening. Trace right mastoid effusion. Other: 6 mm nonenhancing diffusion restricting right occipital scalp nodule (5:16), likely an epidermal inclusion cyst. MRI CERVICAL SPINE FINDINGS Alignment: Normal. Vertebrae: Diffusely T1 hypointense appearance of the bone marrow. Vertebral body heights are preserved. Bone marrow edema involving the left C4-5 and C5-6 facet joints. Cord: Normal signal intensity morphology.  No abnormal enhancement. Posterior Fossa, vertebral arteries: Please see MRI brain. Disc levels: Multilevel desiccation.  Mild C5-6 disc space loss. C2-3: No significant disc bulge, spinal canal or neural foraminal narrowing. C3-4: Small central protrusion with uncovertebral and facet hypertrophy. Patent spinal canal and left neural foramen. Mild right neural foraminal narrowing. C4-5: There is ill-defined enhancing soft tissue encasing the left C4-5 facet joint measuring 2.0 x 1.6 cm (12:16). The soft tissue extends into the left neural and transverse foramen, encasing the left vertebral artery (12:15). Left predominant and dorsal C5 vertebral body erosions. Prevertebral soft tissue thickening and enhancement is concerning for extension of tumor. There is also ill-defined enhancement along the right anterolateral C5 vertebral body (12:15).  Inflammatory changes are seen within the left paraspinal region (11:13). Circumferential soft tissue thickening and enhancement involving the epidural space at the C4-5 level. Small disc osteophyte complex with uncovertebral and right facet hypertrophy. Mild spinal canal narrowing. Patent right neural foramen. Moderate left neural foraminal narrowing secondary to soft tissue encroachment. C5-6: There is inferior extension of ill-defined soft tissue along the left transverse/neural foramen and anterior left facet joint (12:12) left facet joint bone marrow edema. Prominence of mild enhancement involving the epidural space. Disc osteophyte complex with uncovertebral and facet hypertrophy. Mild spinal canal and right neural foraminal narrowing. Soft tissue encroachment narrows the left neural foramen. C6-7: Disc osteophyte complex with uncovertebral and facet hypertrophy. Patent spinal canal and bilateral neural foramina. C7-T1: Disc osteophyte complex with uncovertebral and facet hypertrophy. Patent spinal canal and neural foramen. Paraspinal tissues: As detailed above. MRI LUMBAR SPINE FINDINGS Segmentation:  Standard.  Alignment:  Normal. Vertebrae: Diffuse bone marrow heterogeneity. Multilevel osteophytosis. Metastases are detailed below. Conus medullaris and cauda equina: Conus extends to the L2 level. Conus and cauda equina appear normal. Filar lipoma. Disc levels: Multilevel desiccation and disc space loss most prominent at the L5-S1 level. L1-2: No significant disc bulge, spinal canal or neural foraminal narrowing. L2-3: Disc bulge, ligamentum flavum and bilateral facet hypertrophy. Mild spinal canal and bilateral neural foraminal narrowing. L3-4: L3 superior endplate pathologic fracture deformity with approximately 10-20% height loss. 4.7 x 3.4 cm enhancing metastatic lesion involving the right L3 vertebral body and posterior elements (13:19). There is extension of enhancing tumor into the prevertebral and  anterior epidural spaces at the L3 level (13:20, 8:19) effacing the lateral recesses and abutting the exiting bilateral L3 nerve roots. Ligamentum flavum and bilateral facet hypertrophy. Mild spinal canal and bilateral neural foraminal narrowing. L4-5: Disc bulge abutting the exiting left L4 nerve root, ligamentum flavum and bilateral facet hypertrophy. Mild spinal canal and moderate bilateral neural foraminal narrowing. Abnormal right L4-5 neural foramen soft tissue is discussed below. L5-S1: Disc bulge with superimposed central protrusion abutting the descending bilateral S1 nerve roots, ligamentum flavum and bilateral facet hypertrophy. Patent spinal canal. Mild bilateral neural foraminal narrowing. Paraspinal and other soft tissues: At the level of the lumbosacral junction and extending along the right sacroiliac joint there is enhancing soft tissue measuring 4.2 x 2.6 cm (13:34). Soft tissue extends into the right L4-5 neural foramen (8:31) there are adjacent erosions involving the right lateral L5 vertebral body and superior right sacral ala. Anterior cortical defect is concerning for an associated pathologic fracture (8:36). IMPRESSION: MRI brain: -Sub-5 mm bilateral cerebellar enhancing foci are concerning for metastases. -Sub-5 mm right parietal bone and basilar occipital enhancing foci, concerning for osseous metastases. -Sequela of remote left thalamic and right midbrain insults. Mild cerebral atrophy and chronic microvascular ischemic changes. MRI cervical spine: -Metastatic soft tissue involving the left C5-6 facet joint and right anterolateral C5 vertebral body. There is extension of tumor into the prevertebral and circumferential epidural space at the C5-6 level without significant spinal canal narrowing. -Tumor also extends to involve the left C5-6 and C6-7 transverse and neural foramen with partial encasement of the vertebral artery. -No evidence of spinal cord enhancement. MRI lumbar spine: - L3  metastatic lesion and pathologic fracture with approximately 20% height loss. There is extension of tumor into the prevertebral and anterior epidural space at the L3 level with mild spinal canal and bilateral neural foraminal narrowing. - Partially imaged 4.2 cm right lumbosacral junction/SI joint metastasis with extension of tumor into the right L4-5 neural foramen. Query associated pathologic fracture. - Please see concurrent MR sacrum for better characterization. These results will be called to the ordering clinician or representative by the Radiologist Assistant, and communication documented in the PACS or [employer]. Electronically Signed   By: Primitivo Gauze M.D.   On: 05/29/2020 14:49   MR SACRUM SI JOINTS W WO CONTRAST  Result Date: 05/30/2020 CLINICAL DATA:  Patient with a history of metastatic lung carcinoma. Onset low back pain approximately 1 month ago. Hypermetabolic lesion in the right sacrum on PET scan 05/22/2020. EXAM: MRI SACRUM WITHOUT AND WITH CONTRAST TECHNIQUE: Multiplanar, multisequence MR imaging of the sacrum was performed both before and after administration of intravenous contrast. CONTRAST:  6 mL GADAVIST IV SOLN COMPARISON:  PET scan 05/22/2020. FINDINGS: Bones/Joint/Cartilage As seen on the prior PET scan, there is a destructive, enhancing mass lesion in the right  sacrum measuring approximately 4.4 cm transverse by 2.5 cm AP by 4.7 cm craniocaudal. The right sacral component of the lesion extends into the inferior aspect of the right L5-S1 foramen and impinges on the right L5 root within and beyond the foramen. Abnormal signal and enhancement in the right ilium consistent with tumor immediately posterior to the soft tissue mass measures 3.1 cm transverse by 1.0 cm AP by 4.8 cm craniocaudal. Tumor does not appear to impact the right S1 root. Ligaments Intact. Muscles and Tendons Intact and normal in appearance. Soft tissues Imaged intrapelvic contents are unremarkable.  IMPRESSION: Metastatic disease in the right sacrum and posterior right ilium results in impingement on the right L5 root in the periphery of the foramen and beyond the foramen. Electronically Signed   By: Inge Rise M.D.   On: 05/30/2020 09:27   NM PET Image Initial (PI) Skull Base To Thigh  Result Date: 05/22/2020 CLINICAL DATA:  Initial treatment strategy for lung nodule, adrenal mass. EXAM: NUCLEAR MEDICINE PET SKULL BASE TO THIGH TECHNIQUE: 7.8 mCi F-18 FDG was injected intravenously. Full-ring PET imaging was performed from the skull base to thigh after the radiotracer. CT data was obtained and used for attenuation correction and anatomic localization. Fasting blood glucose: 94 mg/dl COMPARISON:  CT chest 05/16/2020. FINDINGS: Mediastinal blood pool activity: SUV max 2.0 Liver activity: SUV max NA NECK: There is focal hypermetabolism in the sellar region, SUV max 8.4, without a definite CT correlate. No hypermetabolic lymph nodes. Incidental CT findings: None. CHEST: Spiculated nodule in the apical segment right upper lobe measures 1.9 x 2.2 cm with an SUV max of 10.9. There is abnormal hypermetabolism associated with apical segmental bronchial wall thickening and mucoid impaction (3/79, 84 and 87), SUV max 3.8. Hypermetabolic right hilar lymph node measures 7 mm (3/95) with an SUV max of 4.0. Right paratracheal lymph node measures 5 mm (3/81) with an SUV max of 3.1. AP window lymph node measures 6 mm (3/81) with an SUV max 4.9. Focal left hilar metabolism has an SUV max of 3.3 and corresponds to a 5 mm lymph node, better seen on contrast infused CT chest 05/16/2020. New peribronchovascular ground-glass in the superior segment left lower lobe (3/97) has an SUV max 2.8. Mild mid esophageal long segment hypermetabolism, without a definite CT correlate. Incidental CT findings: Atherosclerotic calcification of the aorta and coronary arteries. Heart size normal. No pericardial or pleural effusion.  Centrilobular and paraseptal emphysema. ABDOMEN/PELVIS: Mildly hyperattenuating right adrenal mass measures 1.5 x 2.5 cm with an SUV max 28.6. There is focal hypermetabolism in the left adrenal gland, SUV max 5.6, without a definite CT correlate. No abnormal hypermetabolism in the liver, spleen, pancreas or lymph nodes. Incidental CT findings: Liver, gallbladder, kidneys, spleen, pancreas, stomach and bowel are otherwise unremarkable. Atherosclerotic calcification of the aorta without aneurysm. Slight bladder wall thickening. Prostate is normal in size. SKELETON: There are multiple hypermetabolic lytic lesions in the cervical spine, right scapula, posterior left tenth rib, L3, right sacrum and right pubic bone. Index lesion in the right sacral ala measures 2.9 x 3.4 cm (3/194) with an SUV max of 41.3. Incidental CT findings: None. IMPRESSION: 1. 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 2. Focal hypermetabolism in the sellar region, without a definite CT correlate. Consider MR brain without and with contrast in further evaluation, as clinically indicated. 3.  New peribronchovascular ground-glass in the superior segment left lower lobe, likely infectious or inflammatory in etiology. 4. Aortic atherosclerosis (ICD10-I70.0). Coronary artery calcification. 5.  Emphysema (ICD10-J43.9). Electronically Signed   By: Lorin Picket M.D.   On: 05/22/2020 11:11   CT Biopsy  Result Date: 05/27/2020 INDICATION: 64 year old with a suspicious right lung nodule and suspect metastatic lung disease. Patient has a lucent bone lesion in the right sacral ala. Tissue diagnosis is needed. EXAM: CT-GUIDED CORE BIOPSY OF RIGHT SACRAL BONE LESION Physician: Stephan Minister. Anselm Pancoast, MD MEDICATIONS: None. ANESTHESIA/SEDATION: Fentanyl 50 mcg IV; Versed 1.0 mg  IV Moderate Sedation Time:  19 minutes The patient was continuously monitored during the procedure by the interventional radiology nurse under my direct supervision. COMPLICATIONS: None immediate. PROCEDURE: The procedure was explained to the patient. The risks and benefits of the procedure were discussed and the patient's questions were addressed. Informed consent was obtained from the patient. Time-out was performed. Patient was placed supine on CT scanner. CT images through the lower abdomen were obtained. Right side of the abdomen was prepped with chlorhexidine and sterile field was created. Skin was anesthetized with 1% lidocaine. Small skin incision was made. Using CT guidance, a 17 gauge coaxial needle was directed into the right sacral lesion. Needle was positioned along the lateral aspect of the lesion in order to avoid the nerve. Multiple core biopsies were obtained with an 18 gauge core device. Specimens placed in formalin. 17 gauge needle was removed without complication. Bandage placed over the puncture site. FINDINGS: Destructive lucent lesion involving the right sacral ala. Biopsy needle was confirmed within the lesion. Core specimens obtained. IMPRESSION: CT-guided core biopsies of the right sacral bone lesion. Electronically Signed   By: Markus Daft M.D.   On: 05/27/2020 12:56   DG CHEST PORT 1 VIEW  Result Date: 06/11/2020 CLINICAL DATA:  COVID. EXAM: PORTABLE CHEST 1 VIEW COMPARISON:  06/08/2020.  PET-CT 05/22/2020. FINDINGS: Mediastinum and hilar structures are stable. Known spiculated nodule in the right upper best identified on prior PET-CT. No acute infiltrates. No pleural effusion or pneumothorax. Known bony lesions best identified by prior PET-CT. Carotid vascular calcification. IMPRESSION: 1. Known spiculated right upper lung nodule and multiple bony lesions best identified by prior PET-CT. 2.  No acute pulmonary disease.  Chest is stable from prior exam. 3.  Carotid vascular disease.  Electronically Signed   By: Marcello Moores  Register   On: 06/11/2020 06:11   DG Chest Port 1 View  Result Date: 06/08/2020 CLINICAL DATA:  Dyspnea. Recent diagnosis of lung cancer. Family positive for COVID-19. EXAM: PORTABLE CHEST 1 VIEW COMPARISON:  Radiograph and CT 05/16/2020.  PET CT 05/22/2020. FINDINGS: The lungs are hyperinflated. Stable heart size and mediastinal contours. No convincing acute airspace disease. The known spiculated nodule at the right lung apex is only faintly visualized. No pneumothorax or large pleural effusion. Known osseous metastasis on PET not well seen by radiograph. IMPRESSION: 1. No acute findings. 2. Chronic hyperinflation. The known spiculated nodule at the right lung apex is only faintly visualized radiographically. Electronically Signed   By: Keith Rake M.D.   On: 06/08/2020 18:43   DG ESOPHAGUS W DOUBLE CM (HD)  Result Date: 05/17/2020 CLINICAL DATA:  Odynophagia.  Difficulty swallowing. EXAM: ESOPHOGRAM / BARIUM SWALLOW / BARIUM TABLET STUDY TECHNIQUE: Combined double contrast and single contrast examination performed using effervescent crystals, thick barium liquid, and thin barium liquid. The patient was observed with fluoroscopy swallowing a 13 mm barium sulphate tablet. FLUOROSCOPY TIME:  Fluoroscopy Time:  1 minutes 6 seconds Radiation Exposure Index (if provided by the fluoroscopic device): 3.7 mGy Number of Acquired Spot Images: 0 COMPARISON:  None. FINDINGS: Limited evaluation secondary to patient motion resulting from pain. Normal pharyngeal anatomy and motility. Contrast flowed freely through the esophagus without evidence of a stricture or mass. Mild mucosal thickening and irregularity without ulceration. Esophageal motility was normal. Mild gastroesophageal reflux. No definite hiatal hernia was demonstrated. At the end of the examination a 13 mm barium tablet was administered which transited through the esophagus and esophagogastric junction without delay.  IMPRESSION: 1. Mild mucosal thickening and irregularity without ulceration as can be seen with mild esophagitis. 2. Mild gastroesophageal reflux. 3. No esophageal stricture. Electronically Signed   By: Kathreen Devoid   On: 05/17/2020 08:32    (Echo, Carotid, EGD, Colonoscopy, ERCP)    Subjective: Patient was seen and examined.  First question is asking me is whether he is able to go home.  He dialed his daughter from his phone and wished to go home.  He is on 5 to 6 L of oxygen and was able to maintain adequate oxygenation on 5 L oxygen.  No other overnight events.   Discharge Exam: Vitals:   06/12/20 0411 06/12/20 0450  BP: (!) 161/83   Pulse: 60   Resp: 18   Temp: 98.2 F (36.8 C)   SpO2: 96% 93%   Vitals:   06/11/20 2303 06/12/20 0411 06/12/20 0450 06/12/20 0500  BP:  (!) 161/83    Pulse:  60    Resp:  18    Temp:  98.2 F (36.8 C)    TempSrc:      SpO2: 93% 96% 93%   Weight:    68.1 kg  Height:        General: Pt is alert, awake, not in acute distress Currently on 5 L oxygen, looks comfortable.  Talking in full sentences. Chronically sick looking not in any distress. Cardiovascular: RRR, S1/S2 +, no rubs, no gallops Respiratory: CTA bilaterally, no wheezing, no rhonchi, some conducted airway sounds. Abdominal: Soft, NT, ND, bowel sounds + Extremities: no edema, no cyanosis    The results of significant diagnostics from this hospitalization (including imaging, microbiology, ancillary and laboratory) are listed below for reference.     Microbiology: Recent Results (from the past 240 hour(s))  SARS Coronavirus 2 by RT PCR (hospital order, performed in [facility] hospital lab) Nasopharyngeal Nasopharyngeal Swab     Status: Abnormal   Collection Time: 06/08/20  6:37 PM   Specimen: Nasopharyngeal Swab  Result Value Ref Range Status   SARS Coronavirus 2 POSITIVE (A) NEGATIVE Final    Comment: CRITICAL RESULT CALLED TO, READ BACK BY AND VERIFIED WITH: DR. Regenia Skeeter @  2120 06/08/20 BY TURNER,S. (NOTE) SARS-CoV-2 target nucleic acids are DETECTED  SARS-CoV-2 RNA is generally detectable in upper respiratory specimens  during the acute phase of infection.  Positive results are indicative  of the presence of the identified virus, but do not rule out bacterial infection or co-infection with other pathogens not detected by the test.  Clinical correlation with patient history and  other diagnostic information is necessary to determine patient infection status.  The expected result is negative.  Fact Sheet for Patients:   StrictlyIdeas.no   Fact Sheet for Healthcare Providers:   BankingDealers.co.za    This test is not yet approved or cleared by the [place] FDA and  has been authorized for detection and/or diagnosis of SARS-CoV-2  by FDA under an Emergency Use Authorization (EUA).  This EUA will remain in effect ( meaning this test can be used) for the duration of  the COVID-19 declaration under Section 564(b)(1) of the Act, 21 U.S.C. section 360-bbb-3(b)(1), unless the authorization is terminated or revoked sooner.  Performed at [facility], [place] [address] [place], [place] [address]   Blood Culture (routine x 2)     Status: None (Preliminary result)   Collection Time: 06/08/20  9:21 PM   Specimen: BLOOD  Result Value Ref Range Status   Specimen Description   Final    BLOOD RIGHT WRIST Performed at [facility] [address], [place], [place] [address]    Special Requests   Final    BOTTLES DRAWN AEROBIC AND ANAEROBIC Blood Culture adequate volume Performed at [facility] [address] [place], [place] [address]    Culture   Final    NO GROWTH 3 DAYS Performed at [facility], [place] [address], [place], [place] [address]    Report Status PENDING  Incomplete  Blood Culture (routine x 2)     Status: None (Preliminary result)    Collection Time: 06/08/20  9:26 PM   Specimen: BLOOD RIGHT FOREARM  Result Value Ref Range Status   Specimen Description   Final    BLOOD RIGHT FOREARM Performed at [facility] [address] [place], [place] [address]    Special Requests   Final    BOTTLES DRAWN AEROBIC AND ANAEROBIC Blood Culture adequate volume Performed at [facility] [address], [place],  [place] [address]    Culture   Final    NO GROWTH 3 DAYS Performed at [facility], [place] [address] [place], [place] [address]    Report Status PENDING  Incomplete     Labs: BNP (last 3 results) Recent Labs    06/08/20 1831  BNP 32.6   Basic Metabolic Panel: Recent Labs  Lab 06/08/20 1831 06/08/20 2121 06/09/20 0441 06/10/20 0438 06/11/20 0338 06/12/20 0327  NA 134*  --  138 134* 141 142  K 4.2  --  3.7 3.9 4.0 3.9  CL 91*  --  93* 92* 95* 95*  CO2 31  --  $R'31 31 31 'Ob$ 34*  GLUCOSE 107*  --  140* 143* 134* 139*  BUN 24*  --  24* 37* 34* 29*  CREATININE 1.14  --  1.05 1.10 0.88 0.91  CALCIUM 8.0*  --  8.3* 8.2* 8.8* 8.7*  MG  --  2.8* 2.4  --   --   --   PHOS  --   --  3.5  --   --   --    Liver Function Tests: Recent Labs  Lab 06/08/20 1831 06/09/20 0441 06/10/20 0438 06/11/20 0338 06/12/20 0327  AST 87* 66* 52* 38 32  ALT $Re'25 25 25 25 27  'tVM$ ALKPHOS 66 64 55 57 56  BILITOT 0.7 0.6 0.5 0.5 0.6  PROT 6.6 6.3* 5.6* 6.0* 6.0*  ALBUMIN 2.8* 2.8* 2.4* 2.5* 2.7*   No results for input(s): LIPASE, AMYLASE in the last 168 hours. No results for input(s): AMMONIA in the last 168 hours. CBC: Recent Labs  Lab 06/08/20 1831 06/09/20 0441 06/10/20 0438 06/11/20 0338 06/12/20 0327  WBC 16.7* 13.3* 7.4 6.9 6.1  NEUTROABS 15.1* 12.2*  --   --   --   HGB 9.4* 9.6* 8.4* 8.5* 8.8*  HCT  29.5* 30.3* 26.4* 27.9* 28.7*  MCV 93.4 93.8 93.6 95.5 93.8  PLT 236 209 230 244 268   Cardiac Enzymes: No results for input(s): CKTOTAL, CKMB, CKMBINDEX, TROPONINI in the last  168 hours. BNP: Invalid input(s): POCBNP CBG: Recent Labs  Lab 06/10/20 0744  GLUCAP 188*   D-Dimer Recent Labs    06/11/20 0338 06/12/20 0327  DDIMER 0.79* 0.72*   Hgb A1c No results for input(s): HGBA1C in the last 72 hours. Lipid Profile No results for input(s): CHOL, HDL, LDLCALC, TRIG, CHOLHDL, LDLDIRECT in the last 72 hours. Thyroid function studies No results for input(s): TSH, T4TOTAL, T3FREE, THYROIDAB in the last 72 hours.  Invalid input(s): FREET3 Anemia work up Recent Labs    06/11/20 0338 06/12/20 0327  FERRITIN 219 152   Urinalysis    Component Value Date/Time   COLORURINE YELLOW 06/09/2020 0441   APPEARANCEUR CLEAR 06/09/2020 0441   LABSPEC 1.009 06/09/2020 0441   PHURINE 5.0 06/09/2020 0441   GLUCOSEU NEGATIVE 06/09/2020 0441   HGBUR SMALL (A) 06/09/2020 0441   BILIRUBINUR NEGATIVE 06/09/2020 0441   KETONESUR NEGATIVE 06/09/2020 0441   PROTEINUR NEGATIVE 06/09/2020 0441   NITRITE NEGATIVE 06/09/2020 0441   LEUKOCYTESUR NEGATIVE 06/09/2020 0441   Sepsis Labs Invalid input(s): PROCALCITONIN,  WBC,  LACTICIDVEN Microbiology Recent Results (from the past 240 hour(s))  SARS Coronavirus 2 by RT PCR (hospital order, performed in [facility] hospital lab) Nasopharyngeal Nasopharyngeal Swab     Status: Abnormal   Collection Time: 06/08/20  6:37 PM   Specimen: Nasopharyngeal Swab  Result Value Ref Range Status   SARS Coronavirus 2 POSITIVE (A) NEGATIVE Final    Comment: CRITICAL RESULT CALLED TO, READ BACK BY AND VERIFIED WITH: DR. Regenia Skeeter @ 2120 06/08/20 BY TURNER,S. (NOTE) SARS-CoV-2 target nucleic acids are DETECTED  SARS-CoV-2 RNA is generally detectable in upper respiratory specimens  during the acute phase of infection.  Positive results are indicative  of the presence of the identified virus, but do not rule out bacterial infection or co-infection with other pathogens not detected by the test.  Clinical correlation with patient history and   other diagnostic information is necessary to determine patient infection status.  The expected result is negative.  Fact Sheet for Patients:   StrictlyIdeas.no   Fact Sheet for Healthcare Providers:   BankingDealers.co.za    This test is not yet approved or cleared by the [place] FDA and  has been authorized for detection and/or diagnosis of SARS-CoV-2 by FDA under an Emergency Use Authorization (EUA).  This EUA will remain in effect ( meaning this test can be used) for the duration of  the COVID-19 declaration under Section 564(b)(1) of the Act, 21 U.S.C. section 360-bbb-3(b)(1), unless the authorization is terminated or revoked sooner.  Performed at [facility], [place] [address] [place], [place] [address]   Blood Culture (routine x 2)     Status: None (Preliminary result)   Collection Time: 06/08/20  9:21 PM   Specimen: BLOOD  Result Value Ref Range Status   Specimen Description   Final    BLOOD RIGHT WRIST Performed at [facility] [address], [place], [place] [address]    Special Requests   Final    BOTTLES DRAWN AEROBIC AND ANAEROBIC Blood Culture adequate volume Performed at [facility] [address], [place], [place] [address]    Culture   Final    NO GROWTH 3 DAYS Performed at [facility],  [address] [place], [place] [address]    Report Status PENDING  Incomplete  Blood Culture (routine x 2)     Status: None (Preliminary result)   Collection Time: 06/08/20  9:26 PM   Specimen: BLOOD RIGHT FOREARM  Result Value Ref Range Status   Specimen Description   Final    BLOOD RIGHT FOREARM Performed at [facility] [address], [place], [place] [address]    Special Requests   Final    BOTTLES DRAWN AEROBIC AND ANAEROBIC Blood Culture adequate volume Performed at [facility] [address]  [place], [place] [address]    Culture   Final    NO GROWTH 3 DAYS Performed at [facility], [place] [address], [place], [place] [address]    Report Status PENDING  Incomplete     Time coordinating discharge: 40 minutes  SIGNED:   Barb Merino, MD  [facility] 06/12/2020, 11:49 AM

## 2020-06-12 NOTE — TOC Transition Note (Signed)
Transition of Care [facility]) - CM/SW Discharge Note   Patient Details  Name: Douglas [name] MRN: [id number] Date of Birth: [birth year]  Transition of Care [facility]) CM/SW Contact:  Trish Mage, LCSW Phone Number: 06/12/2020, 10:40 AM   Clinical Narrative:   Patient who is scheduled to d/c today came in on 2 L O2, currently on 6L.  PT will see to determine current rate of need for ambulation.  Spoke to daughter, who states they get O2 through [employer], and he has a portable unit that goes up to 5L.  She will contact [employer] if she needs a higher flow unit. Patient seen by PT.  [name] confirmed that he will be able to make it home on 5L.  She is also recommending [facility].  I called daughter who appreciates the referral. Cindie with [facility] confirmed that they will provide [facility] services.  Daughter will transport home.  No further needs identifed.  TOC sign off.  Addendum:  Discovered patient had no PCP post d/c.  Messaged Dr Mike Gip, patient's oncologist, to ask about signing off on orders.  No response.  Messaged DOC Dr Roderic Palau, who agreed to sign off on simple HH orders.  Let Cindie at [facility] know of this plan. She will follow up with patient and family to make sure they have a PCP appointment set up for on-going care/orders.     Final next level of care: Home/Self Care Barriers to Discharge: No Barriers Identified   Patient Goals and CMS Choice        Discharge Placement                       Discharge Plan and Services                                     Social Determinants of Health (SDOH) Interventions     Readmission Risk Interventions No flowsheet data found.

## 2020-06-12 NOTE — Evaluation (Signed)
Physical Therapy Evaluation Patient Details Name: Douglas [name] MRN: [id number] DOB: 03-26-1956 Today's Date: 06/12/2020   History of Present Illness  64 yo male admitted wit COVID  Clinical Impression  On eval, pt was Min guard assist for mobility. He walked ~60 feet around the room. Mildly unsteady and fatigues fairly easily. O2 95% on 5L at rest, 88% on 4L with activity. Fluctuating O2 sat reading during session. Pt plans to return home on today. Recommend HHPT f/u for monitored progression of activity and O2 needs.     Follow Up Recommendations Home health PT    Equipment Recommendations  None recommended by PT    Recommendations for Other Services       Precautions / Restrictions Precautions Precautions: Fall Precaution Comments: monitor O2 Restrictions Weight Bearing Restrictions: No      Mobility  Bed Mobility Overal bed mobility: Modified Independent                Transfers Overall transfer level: Needs assistance   Transfers: Sit to/from Stand Sit to Stand: Min guard         General transfer comment: Min guard for safety  Ambulation/Gait Ambulation/Gait assistance: Min guard Gait Distance (Feet): 60 Feet Assistive device: None Gait Pattern/deviations: Step-through pattern     General Gait Details: Min guard for safety. O2 88% on 4L [place]  Stairs            Wheelchair Mobility    Modified Rankin (Stroke Patients Only)       Balance Overall balance assessment: Mild deficits observed, not formally tested                                           Pertinent Vitals/Pain Pain Assessment: No/denies pain    Home Living Family/patient expects to be discharged to:: Private residence     Type of Home: Apartment Home Access: Stairs to enter Entrance Stairs-Rails: (P) Right Entrance Stairs-Number of Steps: 3 Home Layout: One level Home Equipment: None      Prior Function Level of Independence: Independent                Hand Dominance        Extremity/Trunk Assessment   Upper Extremity Assessment Upper Extremity Assessment: Overall WFL for tasks assessed    Lower Extremity Assessment Lower Extremity Assessment: Generalized weakness    Cervical / Trunk Assessment Cervical / Trunk Assessment: Normal  Communication   Communication: No difficulties  Cognition Arousal/Alertness: Awake/alert Behavior During Therapy: WFL for tasks assessed/performed Overall Cognitive Status: Within Functional Limits for tasks assessed                                        General Comments      Exercises     Assessment/Plan    PT Assessment All further PT needs can be met in the next venue of care (HHPT)  PT Problem List Decreased activity tolerance       PT Treatment Interventions      PT Goals (Current goals can be found in the Care Plan section)  Acute Rehab PT Goals Patient Stated Goal: home! PT Goal Formulation: All assessment and education complete, DC therapy    Frequency     Barriers to discharge  Co-evaluation               AM-PAC PT "6 Clicks" Mobility  Outcome Measure Help needed turning from your back to your side while in a flat bed without using bedrails?: None Help needed moving from lying on your back to sitting on the side of a flat bed without using bedrails?: None Help needed moving to and from a bed to a chair (including a wheelchair)?: A Little Help needed standing up from a chair using your arms (e.g., wheelchair or bedside chair)?: A Little Help needed to walk in hospital room?: A Little Help needed climbing 3-5 steps with a railing? : A Little 6 Click Score: 20    End of Session Equipment Utilized During Treatment: Oxygen Activity Tolerance: Patient tolerated treatment well Patient left: in bed;with call bell/phone within reach;with bed alarm set   PT Visit Diagnosis: Unsteadiness on feet (R26.81)    Time:  0981-1914 PT Time Calculation (min) (ACUTE ONLY): 11 min   Charges:   PT Evaluation $PT Eval Low Complexity: 1 Low             Doreatha Massed, PT Acute Rehabilitation  Office: [phone] Pager: [phone]

## 2020-06-13 ENCOUNTER — Ambulatory Visit: Payer: Medicare Other

## 2020-06-13 ENCOUNTER — Other Ambulatory Visit: Payer: Self-pay | Admitting: *Deleted

## 2020-06-13 ENCOUNTER — Inpatient Hospital Stay: Payer: Medicare Other | Attending: Hematology and Oncology

## 2020-06-13 DIAGNOSIS — Z9981 Dependence on supplemental oxygen: Secondary | ICD-10-CM | POA: Insufficient documentation

## 2020-06-13 DIAGNOSIS — C7971 Secondary malignant neoplasm of right adrenal gland: Secondary | ICD-10-CM | POA: Insufficient documentation

## 2020-06-13 DIAGNOSIS — Z66 Do not resuscitate: Secondary | ICD-10-CM | POA: Insufficient documentation

## 2020-06-13 DIAGNOSIS — C7951 Secondary malignant neoplasm of bone: Secondary | ICD-10-CM

## 2020-06-13 DIAGNOSIS — C7972 Secondary malignant neoplasm of left adrenal gland: Secondary | ICD-10-CM | POA: Insufficient documentation

## 2020-06-13 DIAGNOSIS — F17210 Nicotine dependence, cigarettes, uncomplicated: Secondary | ICD-10-CM | POA: Insufficient documentation

## 2020-06-13 DIAGNOSIS — Z8616 Personal history of COVID-19: Secondary | ICD-10-CM | POA: Insufficient documentation

## 2020-06-13 DIAGNOSIS — C3411 Malignant neoplasm of upper lobe, right bronchus or lung: Secondary | ICD-10-CM | POA: Insufficient documentation

## 2020-06-13 DIAGNOSIS — R634 Abnormal weight loss: Secondary | ICD-10-CM | POA: Insufficient documentation

## 2020-06-13 DIAGNOSIS — J449 Chronic obstructive pulmonary disease, unspecified: Secondary | ICD-10-CM | POA: Insufficient documentation

## 2020-06-13 DIAGNOSIS — R1310 Dysphagia, unspecified: Secondary | ICD-10-CM | POA: Insufficient documentation

## 2020-06-13 MED ORDER — OXYCODONE HCL ER 10 MG PO T12A: 10.0000 mg | EXTENDED_RELEASE_TABLET | Freq: Three times a day (TID) | ORAL | 0 refills | Status: AC

## 2020-06-13 NOTE — Progress Notes (Signed)
Nutrition  Patient did not show up for nutrition appointment today at 11:15 am.  Message sent to scheduling to offer patient another appointment.  Message also sent to provider.   Jacquis Paxton B. Zenia Resides, [place], [place] Registered Dietitian [phone] (mobile)

## 2020-06-14 ENCOUNTER — Ambulatory Visit: Payer: Medicare Other

## 2020-06-14 LAB — CULTURE, BLOOD (ROUTINE X 2)
Culture: NO GROWTH
Culture: NO GROWTH
Special Requests: ADEQUATE
Special Requests: ADEQUATE

## 2020-06-16 ENCOUNTER — Emergency Department
Admission: EM | Admit: 2020-06-16 | Discharge: 2020-06-16 | Discharge status: Against Medical Advice | Payer: Medicare Other

## 2020-06-16 NOTE — ED Triage Notes (Signed)
First nurse note  Ems reports that pt is confirmed covid + on aug 28th, today he has increased confusion states something doesn't feel right. 5L of O2 at all times due to his hx of lung ca that has metastisized

## 2020-06-17 ENCOUNTER — Inpatient Hospital Stay: Payer: Medicare Other

## 2020-06-17 ENCOUNTER — Encounter: Payer: Self-pay | Admitting: Emergency Medicine

## 2020-06-17 ENCOUNTER — Inpatient Hospital Stay
Admission: EM | Admit: 2020-06-17 | Discharge: 2020-06-21 | DRG: 064 | Disposition: A | Discharge status: Home Health | Payer: Medicare Other | Attending: Internal Medicine | Admitting: Internal Medicine

## 2020-06-17 ENCOUNTER — Emergency Department: Payer: Medicare Other

## 2020-06-17 ENCOUNTER — Other Ambulatory Visit: Payer: Self-pay

## 2020-06-17 DIAGNOSIS — G8191 Hemiplegia, unspecified affecting right dominant side: Secondary | ICD-10-CM | POA: Diagnosis present

## 2020-06-17 DIAGNOSIS — R4182 Altered mental status, unspecified: Secondary | ICD-10-CM | POA: Diagnosis present

## 2020-06-17 DIAGNOSIS — G893 Neoplasm related pain (acute) (chronic): Secondary | ICD-10-CM | POA: Diagnosis present

## 2020-06-17 DIAGNOSIS — J9621 Acute and chronic respiratory failure with hypoxia: Secondary | ICD-10-CM | POA: Diagnosis present

## 2020-06-17 DIAGNOSIS — I639 Cerebral infarction, unspecified: Secondary | ICD-10-CM

## 2020-06-17 DIAGNOSIS — U071 COVID-19: Secondary | ICD-10-CM | POA: Diagnosis present

## 2020-06-17 DIAGNOSIS — R410 Disorientation, unspecified: Secondary | ICD-10-CM | POA: Diagnosis not present

## 2020-06-17 DIAGNOSIS — H47612 Cortical blindness, left side of brain: Secondary | ICD-10-CM | POA: Diagnosis present

## 2020-06-17 DIAGNOSIS — Z8701 Personal history of pneumonia (recurrent): Secondary | ICD-10-CM | POA: Diagnosis not present

## 2020-06-17 DIAGNOSIS — G9341 Metabolic encephalopathy: Secondary | ICD-10-CM | POA: Diagnosis present

## 2020-06-17 DIAGNOSIS — I6523 Occlusion and stenosis of bilateral carotid arteries: Secondary | ICD-10-CM | POA: Diagnosis present

## 2020-06-17 DIAGNOSIS — F17210 Nicotine dependence, cigarettes, uncomplicated: Secondary | ICD-10-CM | POA: Diagnosis present

## 2020-06-17 DIAGNOSIS — Z9981 Dependence on supplemental oxygen: Secondary | ICD-10-CM

## 2020-06-17 DIAGNOSIS — J9611 Chronic respiratory failure with hypoxia: Secondary | ICD-10-CM

## 2020-06-17 DIAGNOSIS — C7931 Secondary malignant neoplasm of brain: Secondary | ICD-10-CM | POA: Diagnosis present

## 2020-06-17 DIAGNOSIS — C799 Secondary malignant neoplasm of unspecified site: Secondary | ICD-10-CM | POA: Diagnosis not present

## 2020-06-17 DIAGNOSIS — C7989 Secondary malignant neoplasm of other specified sites: Secondary | ICD-10-CM | POA: Diagnosis present

## 2020-06-17 DIAGNOSIS — G936 Cerebral edema: Secondary | ICD-10-CM | POA: Diagnosis present

## 2020-06-17 DIAGNOSIS — C7951 Secondary malignant neoplasm of bone: Secondary | ICD-10-CM | POA: Diagnosis present

## 2020-06-17 DIAGNOSIS — M8448XA Pathological fracture, other site, initial encounter for fracture: Secondary | ICD-10-CM | POA: Diagnosis present

## 2020-06-17 DIAGNOSIS — G8929 Other chronic pain: Secondary | ICD-10-CM | POA: Diagnosis not present

## 2020-06-17 DIAGNOSIS — I63512 Cerebral infarction due to unspecified occlusion or stenosis of left middle cerebral artery: Principal | ICD-10-CM | POA: Diagnosis present

## 2020-06-17 DIAGNOSIS — E785 Hyperlipidemia, unspecified: Secondary | ICD-10-CM | POA: Diagnosis present

## 2020-06-17 DIAGNOSIS — Z9181 History of falling: Secondary | ICD-10-CM

## 2020-06-17 DIAGNOSIS — C3490 Malignant neoplasm of unspecified part of unspecified bronchus or lung: Secondary | ICD-10-CM | POA: Diagnosis present

## 2020-06-17 DIAGNOSIS — J410 Simple chronic bronchitis: Secondary | ICD-10-CM | POA: Diagnosis not present

## 2020-06-17 DIAGNOSIS — Z66 Do not resuscitate: Secondary | ICD-10-CM | POA: Diagnosis present

## 2020-06-17 DIAGNOSIS — C3411 Malignant neoplasm of upper lobe, right bronchus or lung: Secondary | ICD-10-CM | POA: Diagnosis present

## 2020-06-17 DIAGNOSIS — R29701 NIHSS score 1: Secondary | ICD-10-CM | POA: Diagnosis present

## 2020-06-17 DIAGNOSIS — E279 Disorder of adrenal gland, unspecified: Secondary | ICD-10-CM | POA: Diagnosis present

## 2020-06-17 DIAGNOSIS — Z515 Encounter for palliative care: Secondary | ICD-10-CM | POA: Diagnosis not present

## 2020-06-17 DIAGNOSIS — R1310 Dysphagia, unspecified: Secondary | ICD-10-CM | POA: Diagnosis present

## 2020-06-17 DIAGNOSIS — R4701 Aphasia: Secondary | ICD-10-CM | POA: Diagnosis present

## 2020-06-17 DIAGNOSIS — Z7951 Long term (current) use of inhaled steroids: Secondary | ICD-10-CM

## 2020-06-17 DIAGNOSIS — I10 Essential (primary) hypertension: Secondary | ICD-10-CM | POA: Diagnosis not present

## 2020-06-17 DIAGNOSIS — D72829 Elevated white blood cell count, unspecified: Secondary | ICD-10-CM | POA: Diagnosis present

## 2020-06-17 DIAGNOSIS — Z79899 Other long term (current) drug therapy: Secondary | ICD-10-CM

## 2020-06-17 DIAGNOSIS — J449 Chronic obstructive pulmonary disease, unspecified: Secondary | ICD-10-CM | POA: Diagnosis present

## 2020-06-17 DIAGNOSIS — Z79891 Long term (current) use of opiate analgesic: Secondary | ICD-10-CM

## 2020-06-17 DIAGNOSIS — R531 Weakness: Secondary | ICD-10-CM

## 2020-06-17 DIAGNOSIS — R4586 Emotional lability: Secondary | ICD-10-CM | POA: Diagnosis present

## 2020-06-17 HISTORY — DX: Malignant (primary) neoplasm, unspecified: C80.1

## 2020-06-17 LAB — DIFFERENTIAL
Abs Immature Granulocytes: 0.15 10*3/uL — ABNORMAL HIGH (ref 0.00–0.07)
Basophils Absolute: 0 10*3/uL (ref 0.0–0.1)
Basophils Relative: 0 %
Eosinophils Absolute: 0.2 10*3/uL (ref 0.0–0.5)
Eosinophils Relative: 1 %
Immature Granulocytes: 1 %
Lymphocytes Relative: 8 %
Lymphs Abs: 1.2 10*3/uL (ref 0.7–4.0)
Monocytes Absolute: 1.2 10*3/uL — ABNORMAL HIGH (ref 0.1–1.0)
Monocytes Relative: 8 %
Neutro Abs: 13.3 10*3/uL — ABNORMAL HIGH (ref 1.7–7.7)
Neutrophils Relative %: 82 %

## 2020-06-17 LAB — CBC
HCT: 32.9 % — ABNORMAL LOW (ref 39.0–52.0)
Hemoglobin: 10.7 g/dL — ABNORMAL LOW (ref 13.0–17.0)
MCH: 29.7 pg (ref 26.0–34.0)
MCHC: 32.5 g/dL (ref 30.0–36.0)
MCV: 91.4 fL (ref 80.0–100.0)
Platelets: 461 10*3/uL — ABNORMAL HIGH (ref 150–400)
RBC: 3.6 MIL/uL — ABNORMAL LOW (ref 4.22–5.81)
RDW: 13.7 % (ref 11.5–15.5)
WBC: 16 10*3/uL — ABNORMAL HIGH (ref 4.0–10.5)
nRBC: 0 % (ref 0.0–0.2)

## 2020-06-17 LAB — COMPREHENSIVE METABOLIC PANEL
ALT: 24 U/L (ref 0–44)
AST: 23 U/L (ref 15–41)
Albumin: 3.3 g/dL — ABNORMAL LOW (ref 3.5–5.0)
Alkaline Phosphatase: 68 U/L (ref 38–126)
Anion gap: 12 (ref 5–15)
BUN: 15 mg/dL (ref 8–23)
CO2: 33 mmol/L — ABNORMAL HIGH (ref 22–32)
Calcium: 8.9 mg/dL (ref 8.9–10.3)
Chloride: 94 mmol/L — ABNORMAL LOW (ref 98–111)
Creatinine, Ser: 1.05 mg/dL (ref 0.61–1.24)
GFR calc Af Amer: >60 mL/min (ref >60)
GFR calc non Af Amer: >60 mL/min (ref >60)
Glucose, Bld: 98 mg/dL (ref 70–99)
Potassium: 3.8 mmol/L (ref 3.5–5.1)
Sodium: 139 mmol/L (ref 135–145)
Total Bilirubin: 0.8 mg/dL (ref 0.3–1.2)
Total Protein: 6.9 g/dL (ref 6.5–8.1)

## 2020-06-17 LAB — MAGNESIUM: Magnesium: 2.3 mg/dL (ref 1.7–2.4)

## 2020-06-17 LAB — URINALYSIS, COMPLETE (UACMP) WITH MICROSCOPIC
Bacteria, UA: NONE SEEN
Bilirubin Urine: NEGATIVE
Glucose, UA: NEGATIVE mg/dL
Hgb urine dipstick: NEGATIVE
Ketones, ur: NEGATIVE mg/dL
Leukocytes,Ua: NEGATIVE
Nitrite: NEGATIVE
Protein, ur: NEGATIVE mg/dL
Specific Gravity, Urine: 1.006 (ref 1.005–1.030)
WBC, UA: NONE SEEN WBC/hpf (ref 0–5)
pH: 7 (ref 5.0–8.0)

## 2020-06-17 LAB — PROCALCITONIN: Procalcitonin: <0.1 ng/mL

## 2020-06-17 LAB — TROPONIN I (HIGH SENSITIVITY): Troponin I (High Sensitivity): 9 ng/L (ref <18)

## 2020-06-17 LAB — TSH: TSH: 1.628 u[IU]/mL (ref 0.350–4.500)

## 2020-06-17 LAB — PROTIME-INR
INR: 1 (ref 0.8–1.2)
Prothrombin Time: 12.9 seconds (ref 11.4–15.2)

## 2020-06-17 LAB — APTT: aPTT: 24 seconds (ref 24–36)

## 2020-06-17 MED ORDER — LOSARTAN POTASSIUM-HCTZ 50-12.5 MG PO TABS: 1.0000 | ORAL_TABLET | Freq: Every day | ORAL | Status: DC

## 2020-06-17 MED ORDER — SODIUM CHLORIDE 0.9% FLUSH
3.0000 mL | Freq: Two times a day (BID) | INTRAVENOUS | Status: DC
  Administered 2020-06-18 – 2020-06-21 (×6): 3 mL via INTRAVENOUS

## 2020-06-17 MED ORDER — GABAPENTIN 300 MG PO CAPS
300.0000 mg | ORAL_CAPSULE | Freq: Two times a day (BID) | ORAL | Status: DC
  Administered 2020-06-17 – 2020-06-21 (×9): 300 mg via ORAL
  Filled 2020-06-17 (×9): qty 1

## 2020-06-17 MED ORDER — OXYCODONE HCL 5 MG PO TABS
5.0000 mg | ORAL_TABLET | ORAL | Status: DC | PRN
  Administered 2020-06-18: 10 mg via ORAL
  Filled 2020-06-17: qty 2

## 2020-06-17 MED ORDER — ONDANSETRON HCL 4 MG/2ML IJ SOLN: 4.0000 mg | Freq: Four times a day (QID) | INTRAMUSCULAR | Status: DC | PRN

## 2020-06-17 MED ORDER — SODIUM CHLORIDE 0.9% FLUSH: 3.0000 mL | INTRAVENOUS | Status: DC | PRN

## 2020-06-17 MED ORDER — DEXAMETHASONE SODIUM PHOSPHATE 10 MG/ML IJ SOLN
10.0000 mg | Freq: Once | INTRAMUSCULAR | Status: AC
  Administered 2020-06-17: 10 mg via INTRAVENOUS
  Filled 2020-06-17: qty 1

## 2020-06-17 MED ORDER — MOMETASONE FURO-FORMOTEROL FUM 200-5 MCG/ACT IN AERO
2.0000 | INHALATION_SPRAY | Freq: Two times a day (BID) | RESPIRATORY_TRACT | Status: DC
  Administered 2020-06-18 – 2020-06-21 (×5): 2 via RESPIRATORY_TRACT
  Filled 2020-06-17 (×2): qty 8.8

## 2020-06-17 MED ORDER — GADOBUTROL 1 MMOL/ML IV SOLN
6.0000 mL | Freq: Once | INTRAVENOUS | Status: AC | PRN
  Administered 2020-06-17: 6 mL via INTRAVENOUS

## 2020-06-17 MED ORDER — DEXAMETHASONE SODIUM PHOSPHATE 4 MG/ML IJ SOLN
4.0000 mg | Freq: Four times a day (QID) | INTRAMUSCULAR | Status: DC
  Administered 2020-06-17 – 2020-06-18 (×3): 4 mg via INTRAVENOUS
  Filled 2020-06-17 (×4): qty 1

## 2020-06-17 MED ORDER — ALBUTEROL SULFATE HFA 108 (90 BASE) MCG/ACT IN AERS
1.0000 | INHALATION_SPRAY | Freq: Four times a day (QID) | RESPIRATORY_TRACT | Status: DC | PRN
  Filled 2020-06-17: qty 6.7

## 2020-06-17 MED ORDER — SODIUM CHLORIDE 0.9 % IV SOLN: INTRAVENOUS | Status: DC

## 2020-06-17 MED ORDER — OXYCODONE HCL ER 10 MG PO T12A
10.0000 mg | EXTENDED_RELEASE_TABLET | Freq: Three times a day (TID) | ORAL | Status: DC
  Administered 2020-06-17 – 2020-06-21 (×13): 10 mg via ORAL
  Filled 2020-06-17 (×13): qty 1

## 2020-06-17 MED ORDER — SODIUM CHLORIDE 0.9% FLUSH: 3.0000 mL | Freq: Once | INTRAVENOUS | Status: DC

## 2020-06-17 MED ORDER — UMECLIDINIUM BROMIDE 62.5 MCG/INH IN AEPB
1.0000 | INHALATION_SPRAY | Freq: Every day | RESPIRATORY_TRACT | Status: DC
  Administered 2020-06-18 – 2020-06-21 (×3): 1 via RESPIRATORY_TRACT
  Filled 2020-06-17 (×2): qty 7

## 2020-06-17 MED ORDER — LOSARTAN POTASSIUM 50 MG PO TABS
50.0000 mg | ORAL_TABLET | Freq: Every day | ORAL | Status: DC
  Administered 2020-06-17 – 2020-06-21 (×5): 50 mg via ORAL
  Filled 2020-06-17 (×5): qty 1

## 2020-06-17 MED ORDER — HYDROCHLOROTHIAZIDE 12.5 MG PO CAPS
12.5000 mg | ORAL_CAPSULE | Freq: Every day | ORAL | Status: DC
  Administered 2020-06-17 – 2020-06-21 (×5): 12.5 mg via ORAL
  Filled 2020-06-17 (×5): qty 1

## 2020-06-17 MED ORDER — ENOXAPARIN SODIUM 40 MG/0.4ML ~~LOC~~ SOLN
40.0000 mg | SUBCUTANEOUS | Status: DC
  Administered 2020-06-17 – 2020-06-20 (×4): 40 mg via SUBCUTANEOUS
  Filled 2020-06-17 (×4): qty 0.4

## 2020-06-17 MED ORDER — SUCRALFATE 1 G PO TABS
1.0000 g | ORAL_TABLET | Freq: Three times a day (TID) | ORAL | Status: DC
  Administered 2020-06-17 – 2020-06-21 (×16): 1 g via ORAL
  Filled 2020-06-17 (×16): qty 1

## 2020-06-17 MED ORDER — BUDESON-GLYCOPYRROL-FORMOTEROL 160-9-4.8 MCG/ACT IN AERO: 2.0000 | INHALATION_SPRAY | Freq: Two times a day (BID) | RESPIRATORY_TRACT | Status: DC

## 2020-06-17 MED ORDER — ONDANSETRON HCL 4 MG PO TABS: 4.0000 mg | ORAL_TABLET | Freq: Four times a day (QID) | ORAL | Status: DC | PRN

## 2020-06-17 MED ORDER — NICOTINE 14 MG/24HR TD PT24: 14.0000 mg | MEDICATED_PATCH | Freq: Every day | TRANSDERMAL | Status: DC | PRN

## 2020-06-17 MED ORDER — HALOPERIDOL LACTATE 5 MG/ML IJ SOLN
1.0000 mg | Freq: Four times a day (QID) | INTRAMUSCULAR | Status: DC | PRN
  Administered 2020-06-17 – 2020-06-20 (×5): 1 mg via INTRAVENOUS
  Filled 2020-06-17 (×5): qty 1

## 2020-06-17 MED ORDER — SODIUM CHLORIDE 0.9 % IV SOLN: 250.0000 mL | INTRAVENOUS | Status: DC | PRN

## 2020-06-17 MED ORDER — PANTOPRAZOLE SODIUM 40 MG PO TBEC
40.0000 mg | DELAYED_RELEASE_TABLET | Freq: Every day | ORAL | Status: DC
  Administered 2020-06-17 – 2020-06-21 (×5): 40 mg via ORAL
  Filled 2020-06-17 (×5): qty 1

## 2020-06-17 NOTE — ED Notes (Signed)
This RN notified daughter via phone that patient has a room upstairs and that the floor states that patient family member cannot stay with the patient and that we have a sitter instead.

## 2020-06-17 NOTE — H&P (Addendum)
History and Physical    Douglas [name] GYI:[id number] DOB: May 18, 1956 DOA: 06/17/2020  PCP: Patient, No Pcp Per   Patient coming from: Home  I have personally briefly reviewed patient's old medical records in [place]  Chief Complaint: Mental status changes  HPI: Douglas [name] is a 64 y.o. 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  He was brought to the ER by his daughter for evaluation of mental status changes which she said has progressively worsened since his discharge from the hospital. She states that his mood is labile, he is agitated and confused.  She also states that he fell 1 day prior to his hospitalization. Patient was scheduled to follow-up with radiation oncology prior to being hospitalized for Covid pneumonia. I am unable to do a review of systems on this patient. Labs show sodium 139, potassium 3.8, chloride 94, bicarb 33, BUN 15, creatinine 1.05, calcium 9.9, AST 23, ALT 24, white count 16, hemoglobin 10.7, hematocrit 32.9, MCV 91.4, RDW 13.7, platelets 461, D-dimer 0.72, PT 12.9, INR 1.0, TSH 1.62 CT scan of the head without contrast showed mild chronic ischemic white matter disease. No acute intracranial abnormality seen.Multiple small rounded lucencies are noted throughout the skull which may represent benign venous lakes, but metastatic disease cannot be excluded given the history of metastatic lung cancer. Chest x-ray reviewed by me shows a right upper lobe density consistent with malignancy. Twelve-lead EKG shows normal sinus rhythm     ED Course: Patient is a 64 year old Caucasian male with a history of COPD, chronic respiratory failure  now on 5 L of oxygen, lung cancer who was brought into the ER for evaluation by his daughter for changes in his mental status which includes agitation, labile mood and falls at home.  Patient has known metastatic disease to the brain and there is concern for possible cerebral edema.  ER physician discussed with oncology who recommended administration of 10 mg of Decadron now until 4 mg IV every 6.  MRI of the brain has been ordered and is pending.   Review of Systems: As per HPI otherwise 10 point review of systems negative.    Past Medical History:  Diagnosis Date  . Cancer ([place])    lung with brain mets  . COPD (chronic obstructive pulmonary disease) ([place])   . Hyperlipidemia   . Hypertension   . Lesion of adrenal gland ([place])   . Lung mass     Past Surgical History:  Procedure Laterality Date  . LUNG BIOPSY  August '21     reports that he has been smoking cigarettes. He started smoking about 45 years ago. He has a 22.50 pack-year smoking history. He has never used smokeless tobacco. He reports current alcohol use.  Drug: Marijuana.  No Known Allergies  Family History  Problem Relation Age of Onset  . Cancer Neg Hx      Prior to Admission medications   Medication Sig Start Date End Date Taking? Authorizing Provider  albuterol (VENTOLIN HFA) 108 (90 Base) MCG/ACT inhaler Inhale 1-2 puffs into the lungs every 6 (six) hours as needed for wheezing or shortness of breath. 05/16/20   [name], MD  Budeson-Glycopyrrol-Formoterol (BREZTRI AEROSPHERE) 160-9-4.8 MCG/ACT AERO Inhale  2 puffs into the lungs in the morning and at bedtime. 05/28/20   Tyler Pita, MD  dexamethasone (DECADRON) 6 MG tablet Take 1 tablet (6 mg total) by mouth daily for 6 days. 06/12/20 06/18/20  Barb Merino, MD  gabapentin (NEURONTIN) 300 MG capsule Take 300 mg by mouth 2 (two) times daily. 05/02/20   [provider]  losartan-hydrochlorothiazide (HYZAAR) 50-12.5 MG tablet Take 1 tablet by mouth  daily. 04/30/20   [provider]  nicotine (NICODERM CQ - DOSED IN MG/24 HOURS) 14 mg/24hr patch Place 1 patch (14 mg total) onto the skin daily as needed for up to 14 days (smoking cessation). 06/12/20 06/26/20  Barb Merino, MD  oxyCODONE (OXY IR/ROXICODONE) 5 MG immediate release tablet Take 1-2 tablets (5-10 mg total) by mouth every 4 (four) hours as needed for severe pain. 06/07/20   Borders, [name], NP  oxyCODONE (OXYCONTIN) 10 mg 12 hr tablet Take 1 tablet (10 mg total) by mouth every 8 (eight) hours. 06/13/20   Borders, [name], NP  pantoprazole (PROTONIX) 40 MG tablet Take 1 tablet (40 mg total) by mouth daily. 05/17/20 08/15/20  [name], MD  sucralfate (CARAFATE) 1 g tablet Take 1 tablet (1 g total) by mouth 4 (four) times daily -  with meals and at bedtime for 7 days. 05/24/20 06/08/20  Irean Hong, NP    Physical Exam: Vitals:   06/17/20 1034 06/17/20 1035 06/17/20 1300 06/17/20 1330  BP: (!) 154/90  (!) 146/90 (!) 165/100  Pulse: 87  73 76  Resp: 16  19 (!) 24  Temp: 99.3 F (37.4 C)     TempSrc: Oral     SpO2: 94%  100% 100%  Weight:  68.1 kg    Height:  5\' 5"  (1.651 m)       Vitals:   06/17/20 1034 06/17/20 1035 06/17/20 1300 06/17/20 1330  BP: (!) 154/90  (!) 146/90 (!) 165/100  Pulse: 87  73 76  Resp: 16  19 (!) 24  Temp: 99.3 F (37.4 C)     TempSrc: Oral     SpO2: 94%  100% 100%  Weight:  68.1 kg    Height:  5\' 5"  (1.651 m)      Constitutional: NAD, alert and oriented x 2.  Chronically ill-appearing Eyes: PERRL, lids and conjunctivae pallor ENMT: Mucous membranes are dry.  Neck: normal, supple, no masses, no thyromegaly Respiratory: clear to auscultation bilaterally, no wheezing, no crackles. Normal respiratory effort. No accessory muscle use.  Cardiovascular: Regular rate and rhythm, no murmurs / rubs / gallops. No extremity edema. 2+ pedal pulses. No carotid bruits.  Abdomen: no tenderness, no masses palpated. No hepatosplenomegaly. Bowel  sounds positive.  Musculoskeletal: no clubbing / cyanosis. No joint deformity upper and lower extremities.  Skin: no rashes, lesions, ulcers.  Neurologic: No gross focal neurologic deficit Psychiatric: Labile mood, flat affect   Labs on Admission: I have personally reviewed following labs and imaging studies  CBC: Recent Labs  Lab 06/11/20 0338 06/12/20 0327 06/17/20 1040  WBC 6.9 6.1 16.0*  NEUTROABS  --   --  13.3*  HGB 8.5* 8.8* 10.7*  HCT 27.9* 28.7* 32.9*  MCV 95.5 93.8 91.4  PLT 244 268 161*   Basic Metabolic Panel: Recent Labs  Lab 06/11/20 0338 06/12/20 0327 06/17/20 1040  NA 141 142 139  K 4.0 3.9 3.8  CL 95* 95* 94*  CO2 31 34* 33*  GLUCOSE 134* 139* 98  BUN  34* 29* 15  CREATININE 0.88 0.91 1.05  CALCIUM 8.8* 8.7* 8.9  MG  --   --  2.3   GFR: Estimated Creatinine Clearance: 61 mL/min (by C-G formula based on SCr of 1.05 mg/dL). Liver Function Tests: Recent Labs  Lab 06/11/20 0338 06/12/20 0327 06/17/20 1040  AST 38 32 23  ALT 25 27 24   ALKPHOS 57 56 68  BILITOT 0.5 0.6 0.8  PROT 6.0* 6.0* 6.9  ALBUMIN 2.5* 2.7* 3.3*   No results for input(s): LIPASE, AMYLASE in the last 168 hours. No results for input(s): AMMONIA in the last 168 hours. Coagulation Profile: Recent Labs  Lab 06/17/20 1040  INR 1.0   Cardiac Enzymes: No results for input(s): CKTOTAL, CKMB, CKMBINDEX, TROPONINI in the last 168 hours. BNP (last 3 results) No results for input(s): PROBNP in the last 8760 hours. HbA1C: No results for input(s): HGBA1C in the last 72 hours. CBG: No results for input(s): GLUCAP in the last 168 hours. Lipid Profile: No results for input(s): CHOL, HDL, LDLCALC, TRIG, CHOLHDL, LDLDIRECT in the last 72 hours. Thyroid Function Tests: Recent Labs    06/17/20 1040  TSH 1.628   Anemia Panel: No results for input(s): VITAMINB12, FOLATE, FERRITIN, TIBC, IRON, RETICCTPCT in the last 72 hours. Urine analysis:    Component Value Date/Time    COLORURINE YELLOW 06/09/2020 0441   APPEARANCEUR CLEAR 06/09/2020 0441   LABSPEC 1.009 06/09/2020 0441   PHURINE 5.0 06/09/2020 0441   GLUCOSEU NEGATIVE 06/09/2020 0441   HGBUR SMALL (A) 06/09/2020 0441   BILIRUBINUR NEGATIVE 06/09/2020 0441   KETONESUR NEGATIVE 06/09/2020 0441   PROTEINUR NEGATIVE 06/09/2020 0441   NITRITE NEGATIVE 06/09/2020 0441   LEUKOCYTESUR NEGATIVE 06/09/2020 0441    Radiological Exams on Admission: DG Chest 1 View  Result Date: 06/17/2020 CLINICAL DATA:  History of metastatic lung cancer. Altered mental status. EXAM: CHEST  1 VIEW COMPARISON:  June 11, 2020. FINDINGS: The heart size and mediastinal contours are within normal limits. Left lung is clear. Right upper lobe irregular density is again noted consistent with malignancy. No pneumothorax or pleural effusion is noted. The visualized skeletal structures are unremarkable. IMPRESSION: Stable right upper lobe density consistent with malignancy. No other abnormality seen in the chest. Electronically Signed   By: Marijo Conception M.D.   On: 06/17/2020 13:18   CT HEAD WO CONTRAST  Result Date: 06/17/2020 CLINICAL DATA:  Altered mental status. History of metastatic lung cancer. EXAM: CT HEAD WITHOUT CONTRAST TECHNIQUE: Contiguous axial images were obtained from the base of the skull through the vertex without intravenous contrast. COMPARISON:  May 29, 2020. FINDINGS: Brain: Mild chronic ischemic white matter disease is noted. No mass effect or midline shift is noted. Ventricular size is within normal limits. There is no evidence of mass lesion, hemorrhage or acute infarction. Possible metastatic lesions noted on recent MRI are not visualized on this study. Vascular: No hyperdense vessel or unexpected calcification. Skull: No definite fracture is noted. Multiple small rounded lucencies are noted throughout the skull which may represent benign venous lakes, but metastatic disease cannot be excluded given the history of  metastatic lung cancer. Sinuses/Orbits: No acute finding. Other: None. IMPRESSION: 1. Mild chronic ischemic white matter disease. No acute intracranial abnormality seen. 2. Multiple small rounded lucencies are noted throughout the skull which may represent benign venous lakes, but metastatic disease cannot be excluded given the history of metastatic lung cancer. Electronically Signed   By: Marijo Conception M.D.   On:  06/17/2020 11:59    EKG: Independently reviewed.  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  He is at high risk for falls MRI of the brain has been ordered and results are pending Appreciate oncology input and he recommends to treat patient with Decadron 4 mg IV q 6 hours   History of stage IV lung cancer Patient was recently diagnosed with a lung mass and has metastatic disease to the brain and bone Was scheduled to follow-up with radiation oncology unable to keep that appointment due to recent hospitalization Follow-up with oncology as an outpatient    COVID-19 virus infection Patient tested positive for the COVID-19 virus on 06/08/20 and was hospitalized at [facility] for Covid pneumonia and acute on chronic respiratory failure He was treated with remdesivir and Decadron and discharged home to complete his course of Decadron Patient will remain on isolation since he is only 10 days post his  diagnosis   Leukocytosis Most likely secondary to systemic steroid therapy No evidence of an infection at this time   COPD Not acutely exacerbated Continue as needed bronchodilator therapy   Chronic respiratory failure Patient has a new baseline and is now on 5 L of oxygen from 2 L following treatment for Covid pneumonia Maintain pulse oximetry greater than 92%   Nicotine dependence Cessation has been discussed with patient in detail Place patient on nicotine transdermal patch   DVT prophylaxis: Lovenox Code Status: DO NOT RESUSCITATE Family Communication: Greater than 50% of time was spent discussing patient's condition and plan of care with his daughter at the bedside.  Patient is a high fall risk and daughter is willing to stay at the bedside especially since they are both Covid positive.  CODE STATUS was discussed and he is a DO NOT RESUSCITATE Disposition Plan: Back to previous home environment Consults called: Oncology    Collier Bullock MD [facility]     06/17/2020, 3:16 PM

## 2020-06-17 NOTE — ED Notes (Signed)
Pt comfortable in bed at this time, resting. Waiting for tech to transport and sit with patient on floor.

## 2020-06-17 NOTE — ED Notes (Signed)
Pt taken to MRI at this time

## 2020-06-17 NOTE — ED Notes (Signed)
Pt family, who had been at bedside at this time, informed this RN that should would leave to shower and change while patient is in MRI and that she would be back when the patient returns to sit with him due to altered mental status.

## 2020-06-17 NOTE — ED Notes (Signed)
Pt given dinner tray at this time.

## 2020-06-17 NOTE — ED Notes (Signed)
This RN has talked at length with patient's family, RN, [facility], and admitting providers about visitation during admission. Pt had been agreeable to leaving ED until informed by admitting MD that she could stay. Pts family now questioning why she can not stay in admission room. Pt has talked to [facility] in regards to visitation policy and is stating she does not want to leave bedside due to patient's altered mental status.   Family reporting she tested positive on the 23rd and is not at risk for COVID herself. Pts family is adament she is not leaving bedside and has talked to admitting NP on the phone about situation as well. Pts family is calm at this time.

## 2020-06-17 NOTE — ED Notes (Signed)
Admitting MD at bedside.

## 2020-06-17 NOTE — ED Provider Notes (Addendum)
[facility] Emergency Department Provider Note  ____________________________________________   First MD Initiated Contact with Patient 06/17/20 1229     (approximate)  I have reviewed the triage vital signs and the nursing notes.   HISTORY  Chief Complaint Altered Mental Status and Weakness   HPI Douglas [name] is a 64 y.o. male with baseline severe COPD and chronic hypoxic failure on 2 L oxygen at home, recently diagnosed lung cancer metastatic under treatment at [facility] regional, hypertension hyperlipidemia and recent admission for acute on chronic hypoxic respiratory failure secondary to COVID-19 diagnosed on 828 who presents via EMS from home for further for evaluation of confusion associated symptoms right hemibody weakness.  History is somewhat limited from the patient is not oriented to year.  He is accompanied by his daughter who assist in providing history.  Patient reportedly prior to being hospitalized for Covid was independent and ambulatory without any significant effusion or focal deficits.  Since getting home from the hospital he has been more confused and has not been able to walk over the last 2 days.  Patient daughter also thinks this is over the time.  He has developed weakness in his right arm and right leg.  No witnessed falls or injuries.  Patient is hospitalized 5 L he has not had any recent vomiting, fevers, diarrhea, or other acute symptoms per daughter.  No clear alleviating aggravating factors.  No prior similar episodes.  Patient and daughter are aware of the patient has brain metastases have his lung cancer.  He has not been started on chemotherapy.   Past Medical History:  Diagnosis Date  . Cancer ([place])    lung with brain mets  . COPD (chronic obstructive pulmonary disease) ([place])   . Hyperlipidemia   . Hypertension   . Lesion of adrenal gland ([place])   . Lung mass     Patient Active Problem List   Diagnosis Date Noted  . AMS  (altered mental status) 06/17/2020  . Lung cancer ([place]) 06/09/2020  . Acute on chronic respiratory failure with hypoxia ([place]) 06/08/2020  . COVID-19 virus infection 06/08/2020  . Severe sepsis ([place]) 06/08/2020  . COPD with acute exacerbation ([place]) 06/08/2020  . SOB (shortness of breath) 06/08/2020  . Cancer-related pain 05/27/2020  . Weight loss 05/27/2020  . Dysphagia 05/27/2020  . Bone metastasis ([place]) 05/27/2020  . Lytic bone lesions on xray 05/23/2020  . Goals of care, counseling/discussion 05/23/2020  . Adrenal mass ([place]) 05/18/2020  . Esophagitis 05/16/2020  . Mass of upper lobe of right lung 05/16/2020  . Hypertension   . Hyperlipidemia   . COPD (chronic obstructive pulmonary disease) ([place])     Past Surgical History:  Procedure Laterality Date  . LUNG BIOPSY  August '21    Prior to Admission medications   Medication Sig Start Date End Date Taking? Authorizing Provider  albuterol (VENTOLIN HFA) 108 (90 Base) MCG/ACT inhaler Inhale 1-2 puffs into the lungs every 6 (six) hours as needed for wheezing or shortness of breath. 05/16/20   [name], MD  Budeson-Glycopyrrol-Formoterol (BREZTRI AEROSPHERE) 160-9-4.8 MCG/ACT AERO Inhale 2 puffs into the lungs in the morning and at bedtime. 05/28/20   Tyler Pita, MD  dexamethasone (DECADRON) 6 MG tablet Take 1 tablet (6 mg total) by mouth daily for 6 days. 06/12/20 06/18/20  Barb Merino, MD  gabapentin (NEURONTIN) 300 MG capsule Take 300 mg by mouth 2 (two) times daily. 05/02/20   [provider]  losartan-hydrochlorothiazide (HYZAAR) 50-12.5  MG tablet Take 1 tablet by mouth daily. 04/30/20   [provider]  nicotine (NICODERM CQ - DOSED IN MG/24 HOURS) 14 mg/24hr patch Place 1 patch (14 mg total) onto the skin daily as needed for up to 14 days (smoking cessation). 06/12/20 06/26/20  Barb Merino, MD  oxyCODONE (OXY IR/ROXICODONE) 5 MG immediate release tablet Take 1-2 tablets (5-10 mg total) by mouth every 4  (four) hours as needed for severe pain. 06/07/20   Borders, [name], NP  oxyCODONE (OXYCONTIN) 10 mg 12 hr tablet Take 1 tablet (10 mg total) by mouth every 8 (eight) hours. 06/13/20   Borders, [name], NP  pantoprazole (PROTONIX) 40 MG tablet Take 1 tablet (40 mg total) by mouth daily. 05/17/20 08/15/20  [name], MD  sucralfate (CARAFATE) 1 g tablet Take 1 tablet (1 g total) by mouth 4 (four) times daily -  with meals and at bedtime for 7 days. 05/24/20 06/08/20  Borders, [name], NP    Allergies Patient has no known allergies.  Family History  Problem Relation Age of Onset  . Cancer Neg Hx     Social History Social History   Tobacco Use  . Smoking status: Current Every Day Smoker    Packs/day: 0.50    Years: 45.00    Pack years: 22.50    Types: Cigarettes    Start date: 05/29/1975  . Smokeless tobacco: Never Used  . Tobacco comment: 1 pack/week  Vaping Use  . Vaping Use: Never used  Substance Use Topics  . Alcohol use: Yes    Comment: 2 beers per day  . Drug use: Not on file    Review of Systems  Review of Systems  Unable to perform ROS: Mental status change      ____________________________________________   PHYSICAL EXAM:  VITAL SIGNS: ED Triage Vitals  Enc Vitals Group     BP 06/17/20 1034 (!) 154/90     Pulse Rate 06/17/20 1034 87     Resp 06/17/20 1034 16     Temp 06/17/20 1034 99.3 F (37.4 C)     Temp Source 06/17/20 1034 Oral     SpO2 06/17/20 1034 94 %     Weight 06/17/20 1035 150 lb 2.1 oz (68.1 kg)     Height 06/17/20 1035 5\' 5"  (1.651 m)     Head Circumference --      Peak Flow --      Pain Score 06/17/20 1034 0     Pain Loc --      Pain Edu? --      Excl. in [place]? --    Vitals:   06/17/20 1300 06/17/20 1330  BP: (!) 146/90 (!) 165/100  Pulse: 73 76  Resp: 19 (!) 24  Temp:    SpO2: 100% 100%   Physical Exam Vitals and nursing note reviewed.  Constitutional:      Appearance: He is well-developed.  HENT:     Head: Normocephalic  and atraumatic.     Right Ear: External ear normal.     Left Ear: External ear normal.  Eyes:     Conjunctiva/sclera: Conjunctivae normal.  Cardiovascular:     Rate and Rhythm: Normal rate and regular rhythm.     Pulses: Normal pulses.     Heart sounds: No murmur heard.   Pulmonary:     Effort: Pulmonary effort is normal. No respiratory distress.     Breath sounds: Normal breath sounds.  Abdominal:  Palpations: Abdomen is soft.     Tenderness: There is no abdominal tenderness.  Musculoskeletal:     Cervical back: Neck supple.  Skin:    General: Skin is warm and dry.  Neurological:     Mental Status: He is alert. He is disoriented and confused.     Motor: Weakness present.     PERRLA.  EOMI.  Cranial nerves II through XII grossly intact.  Patient has 3/5 strength in his right upper extremity and his right lower extremity.  5/5 strength in his left upper and left lower extremity.  Sensation is intact light touch throughout all extremities.  Patient does not have any pronator drift of his left upper extremity but is unable to maintain elevation of his right arm on pronator drift testing for any significant amount time.   Patient is not oriented to year or date and is very frustrated on exam. ____________________________________________   LABS (all labs ordered are listed, but only abnormal results are displayed)  Labs Reviewed  CBC - Abnormal; Notable for the following components:      Result Value   WBC 16.0 (*)    RBC 3.60 (*)    Hemoglobin 10.7 (*)    HCT 32.9 (*)    Platelets 461 (*)    All other components within normal limits  DIFFERENTIAL - Abnormal; Notable for the following components:   Neutro Abs 13.3 (*)    Monocytes Absolute 1.2 (*)    Abs Immature Granulocytes 0.15 (*)    All other components within normal limits  COMPREHENSIVE METABOLIC PANEL - Abnormal; Notable for the following components:   Chloride 94 (*)    CO2 33 (*)    Albumin 3.3 (*)    All  other components within normal limits  PROTIME-INR  APTT  MAGNESIUM  TSH  AMMONIA  URINALYSIS, COMPLETE (UACMP) WITH MICROSCOPIC  PROCALCITONIN  I-STAT CREATININE, ED  CBG MONITORING, ED  TROPONIN I (HIGH SENSITIVITY)   ____________________________________________  EKG  Sinus rhythm with a ventricular rate of 92, normal axis, unremarkable intervals, very poor tracing in V1 and somewhat nonspecific changes in the lateral leads with no clear evidence of acute ischemia or underlying arrhythmia. ____________________________________________  RADIOLOGY  ED MD interpretation: No large bleeding or skull fracture.  Multiple rounded lucent spots.  Official radiology report(s): DG Chest 1 View  Result Date: 06/17/2020 CLINICAL DATA:  History of metastatic lung cancer. Altered mental status. EXAM: CHEST  1 VIEW COMPARISON:  June 11, 2020. FINDINGS: The heart size and mediastinal contours are within normal limits. Left lung is clear. Right upper lobe irregular density is again noted consistent with malignancy. No pneumothorax or pleural effusion is noted. The visualized skeletal structures are unremarkable. IMPRESSION: Stable right upper lobe density consistent with malignancy. No other abnormality seen in the chest. Electronically Signed   By: Marijo Conception M.D.   On: 06/17/2020 13:18   CT HEAD WO CONTRAST  Result Date: 06/17/2020 CLINICAL DATA:  Altered mental status. History of metastatic lung cancer. EXAM: CT HEAD WITHOUT CONTRAST TECHNIQUE: Contiguous axial images were obtained from the base of the skull through the vertex without intravenous contrast. COMPARISON:  May 29, 2020. FINDINGS: Brain: Mild chronic ischemic white matter disease is noted. No mass effect or midline shift is noted. Ventricular size is within normal limits. There is no evidence of mass lesion, hemorrhage or acute infarction. Possible metastatic lesions noted on recent MRI are not visualized on this study. Vascular:  No hyperdense vessel  or unexpected calcification. Skull: No definite fracture is noted. Multiple small rounded lucencies are noted throughout the skull which may represent benign venous lakes, but metastatic disease cannot be excluded given the history of metastatic lung cancer. Sinuses/Orbits: No acute finding. Other: None. IMPRESSION: 1. Mild chronic ischemic white matter disease. No acute intracranial abnormality seen. 2. Multiple small rounded lucencies are noted throughout the skull which may represent benign venous lakes, but metastatic disease cannot be excluded given the history of metastatic lung cancer. Electronically Signed   By: Marijo Conception M.D.   On: 06/17/2020 11:59    ____________________________________________   PROCEDURES  Procedure(s) performed (including Critical Care):  .1-3 Lead EKG Interpretation Performed by: Lucrezia Starch, MD Authorized by: Lucrezia Starch, MD     Interpretation: normal     ECG rate assessment: normal     Rhythm: sinus rhythm     Ectopy: none     Conduction: normal       ____________________________________________   INITIAL IMPRESSION / ASSESSMENT AND PLAN / ED COURSE        Overall patient's history, exam, and the work-up is concerning for altered mental status and new onset of weakness likely secondary to progression of known metastatic brain mets.  Patient is hypertensive otherwise stable vital signs on his 5 L on arrival.  Exam as above remarkable for confused patient that has right-sided weakness.  CT head obtained does show known brain mets.  However, MR brain will also show evidence of any subacute stroke. Patient does have a leukocytosis although this is not unexpected given he is on Decadron for his recent COVID-19 infection diagnosis.  No other significant left letter metabolic derangements identified patient's TSH is WNL.  ECG and troponin are not consistent with ACS.  There are no history or exam findings to suggest an  acute traumatic injury or toxic ingestion.  I did discuss patient's presentation with on-call oncologist and recommend giving patient 10 mg of Decadron now and then 4 mg every 6 hours.  He also recommended obtaining MR brain admission to hospital service for further evaluation management.  We will plan to admit to hospital service.  _________________________________   FINAL CLINICAL IMPRESSION(S) / ED DIAGNOSES  Final diagnoses:  Metastatic malignant neoplasm, unspecified site [facility])  Acute on chronic respiratory failure with hypoxia (HCC)  Altered mental status, unspecified altered mental status type  Weakness  Hypertension, unspecified type    Medications  sodium chloride flush (NS) 0.9 % injection 3 mL (3 mLs Intravenous Not Given 06/17/20 1237)  dexamethasone (DECADRON) injection 4 mg (has no administration in time range)  dexamethasone (DECADRON) injection 10 mg (10 mg Intravenous Given 06/17/20 1325)     ED Discharge Orders    None       Note:  This document was prepared using Dragon voice recognition software and may include unintentional dictation errors.   Lucrezia Starch, MD 06/17/20 1433    Lucrezia Starch, MD 06/17/20 2015

## 2020-06-17 NOTE — ED Notes (Signed)
Pt family back at bedside at this time. Pt family states that she is upset because admitting doctor had told patient she could stay with family, but now the floor will not allow family to sit with patient. [place] in touch with this RN and charge Rn at this time. Charge RN notified to talk to patient family member.

## 2020-06-17 NOTE — ED Notes (Signed)
Patients socks, underwear, and pants placed in belongings bag at bedside

## 2020-06-17 NOTE — ED Triage Notes (Signed)
Patient has lung ca with mets.  Wears home oxygen 5l/ [place].  Diagnosed with COVID on 8/28.  Wife states confusion started yesterday, presented to the ED yesterday, but patient did not stay for evaluation.  Wife states confusion worsening with progressing right sided weakness.  States initially noticed right sided weakness  Yesterday at around 1300.

## 2020-06-18 ENCOUNTER — Inpatient Hospital Stay
Admit: 2020-06-18 | Discharge: 2020-06-18 | Disposition: A | Discharge status: Home / Self Care | Payer: Medicare Other | Attending: Internal Medicine | Admitting: Internal Medicine

## 2020-06-18 ENCOUNTER — Inpatient Hospital Stay: Payer: Medicare Other

## 2020-06-18 ENCOUNTER — Ambulatory Visit: Payer: Medicare Other

## 2020-06-18 DIAGNOSIS — C7931 Secondary malignant neoplasm of brain: Secondary | ICD-10-CM

## 2020-06-18 DIAGNOSIS — I639 Cerebral infarction, unspecified: Secondary | ICD-10-CM

## 2020-06-18 DIAGNOSIS — C3490 Malignant neoplasm of unspecified part of unspecified bronchus or lung: Secondary | ICD-10-CM

## 2020-06-18 DIAGNOSIS — R410 Disorientation, unspecified: Secondary | ICD-10-CM

## 2020-06-18 DIAGNOSIS — R4182 Altered mental status, unspecified: Secondary | ICD-10-CM

## 2020-06-18 DIAGNOSIS — G9341 Metabolic encephalopathy: Secondary | ICD-10-CM | POA: Insufficient documentation

## 2020-06-18 DIAGNOSIS — G8929 Other chronic pain: Secondary | ICD-10-CM | POA: Insufficient documentation

## 2020-06-18 DIAGNOSIS — Z515 Encounter for palliative care: Secondary | ICD-10-CM

## 2020-06-18 DIAGNOSIS — I63512 Cerebral infarction due to unspecified occlusion or stenosis of left middle cerebral artery: Principal | ICD-10-CM

## 2020-06-18 DIAGNOSIS — U071 COVID-19: Secondary | ICD-10-CM

## 2020-06-18 LAB — CBC
HCT: 30.9 % — ABNORMAL LOW (ref 39.0–52.0)
Hemoglobin: 10 g/dL — ABNORMAL LOW (ref 13.0–17.0)
MCH: 29.7 pg (ref 26.0–34.0)
MCHC: 32.4 g/dL (ref 30.0–36.0)
MCV: 91.7 fL (ref 80.0–100.0)
Platelets: 414 10*3/uL — ABNORMAL HIGH (ref 150–400)
RBC: 3.37 MIL/uL — ABNORMAL LOW (ref 4.22–5.81)
RDW: 13.8 % (ref 11.5–15.5)
WBC: 8.9 10*3/uL (ref 4.0–10.5)
nRBC: 0 % (ref 0.0–0.2)

## 2020-06-18 LAB — BASIC METABOLIC PANEL
Anion gap: 11 (ref 5–15)
BUN: 15 mg/dL (ref 8–23)
CO2: 31 mmol/L (ref 22–32)
Calcium: 8.8 mg/dL — ABNORMAL LOW (ref 8.9–10.3)
Chloride: 98 mmol/L (ref 98–111)
Creatinine, Ser: 0.95 mg/dL (ref 0.61–1.24)
GFR calc Af Amer: >60 mL/min (ref >60)
GFR calc non Af Amer: >60 mL/min (ref >60)
Glucose, Bld: 118 mg/dL — ABNORMAL HIGH (ref 70–99)
Potassium: 4.4 mmol/L (ref 3.5–5.1)
Sodium: 140 mmol/L (ref 135–145)

## 2020-06-18 LAB — BLOOD GAS, ARTERIAL
Acid-Base Excess: 4.6 mmol/L — ABNORMAL HIGH (ref 0.0–2.0)
Bicarbonate: 30.3 mmol/L — ABNORMAL HIGH (ref 20.0–28.0)
FIO2: 0.6
O2 Saturation: 99.8 %
Patient temperature: 37
pCO2 arterial: 50 mmHg — ABNORMAL HIGH (ref 32.0–48.0)
pH, Arterial: 7.39 (ref 7.350–7.450)
pO2, Arterial: 223 mmHg — ABNORMAL HIGH (ref 83.0–108.0)

## 2020-06-18 LAB — LIPID PANEL
Cholesterol: 124 mg/dL (ref 0–200)
HDL: 58 mg/dL (ref >40)
LDL Cholesterol: 41 mg/dL (ref 0–99)
Total CHOL/HDL Ratio: 2.1 RATIO
Triglycerides: 126 mg/dL (ref <150)
VLDL: 25 mg/dL (ref 0–40)

## 2020-06-18 MED ORDER — DEXAMETHASONE SODIUM PHOSPHATE 4 MG/ML IJ SOLN
4.0000 mg | Freq: Two times a day (BID) | INTRAMUSCULAR | Status: DC
  Administered 2020-06-18: 4 mg via INTRAVENOUS
  Filled 2020-06-18: qty 1

## 2020-06-18 MED ORDER — ATORVASTATIN CALCIUM 20 MG PO TABS
40.0000 mg | ORAL_TABLET | Freq: Every evening | ORAL | Status: DC
  Administered 2020-06-18 – 2020-06-20 (×3): 40 mg via ORAL
  Filled 2020-06-18 (×3): qty 2

## 2020-06-18 MED ORDER — TRAZODONE HCL 50 MG PO TABS
50.0000 mg | ORAL_TABLET | Freq: Every day | ORAL | Status: DC
  Administered 2020-06-18 – 2020-06-20 (×3): 50 mg via ORAL
  Filled 2020-06-18 (×3): qty 1

## 2020-06-18 MED ORDER — ASPIRIN EC 325 MG PO TBEC
325.0000 mg | DELAYED_RELEASE_TABLET | Freq: Every day | ORAL | Status: DC
  Administered 2020-06-18 – 2020-06-21 (×4): 325 mg via ORAL
  Filled 2020-06-18 (×4): qty 1

## 2020-06-18 NOTE — ED Notes (Signed)
Pt cleaned up and linen changed by this RN and Linus Orn, NT. Pt tolerated well.

## 2020-06-18 NOTE — Progress Notes (Signed)
Patient ID: Douglas [name], male   DOB: 14-May-1956, 64 y.o.   MRN: [id number] [facility] PROGRESS NOTE  Douglas [name] BOF:[id number] DOB: 1956/10/12 DOA: 06/17/2020 PCP: Patient, No Pcp Per  HPI/Subjective: Patient did not sleep very well last night.  Has had altered mental status.  Weakness in his right arm and hand.  Recent hospitalization for Covid.  Patient states that he does not have any problem swallowing.  Objective: Vitals:   06/18/20 0630 06/18/20 0700  BP: 104/82 (!) 150/99  Pulse: 60 68  Resp:    Temp:    SpO2: 100% 100%    Intake/Output Summary (Last 24 hours) at 06/18/2020 1255 Last data filed at 06/17/2020 1430 Gross per 24 hour  Intake --  Output 300 ml  Net -300 ml   Filed Weights   06/17/20 1035  Weight: 68.1 kg    ROS: Review of Systems  Constitutional: Positive for malaise/fatigue.  Respiratory: Positive for cough and shortness of breath.   Cardiovascular: Negative for chest pain.  Gastrointestinal: Negative for abdominal pain, nausea and vomiting.  Neurological: Positive for focal weakness.   Exam: Physical Exam HENT:     Nose: No mucosal edema.     Mouth/Throat:     Pharynx: No oropharyngeal exudate.  Eyes:     General: Lids are normal.     Conjunctiva/sclera: Conjunctivae normal.  Cardiovascular:     Rate and Rhythm: Normal rate and regular rhythm.     Heart sounds: Normal heart sounds, S1 normal and S2 normal.  Pulmonary:     Breath sounds: Examination of the right-lower field reveals decreased breath sounds. Examination of the left-lower field reveals decreased breath sounds. Decreased breath sounds present. No wheezing, rhonchi or rales.  Abdominal:     Palpations: Abdomen is soft.     Tenderness: There is no abdominal tenderness.  Musculoskeletal:     Right lower leg: No swelling.     Left lower leg: No swelling.  Skin:    General: Skin is warm.     Findings: No rash.  Neurological:     Mental Status: He is alert.      Comments: Answers some yes or no questions.  Does not elaborate much.  Weakness with right grip strength and extension of right fingers.  Able to lift up the arm up off bed.  Weakness right ankle with flexion and extension.  Power 5 out of 5 left side.       Data Reviewed: Basic Metabolic Panel: Recent Labs  Lab 06/12/20 0327 06/17/20 1040 06/18/20 0523  NA 142 139 140  K 3.9 3.8 4.4  CL 95* 94* 98  CO2 34* 33* 31  GLUCOSE 139* 98 118*  BUN 29* 15 15  CREATININE 0.91 1.05 0.95  CALCIUM 8.7* 8.9 8.8*  MG  --  2.3  --    Liver Function Tests: Recent Labs  Lab 06/12/20 0327 06/17/20 1040  AST 32 23  ALT 27 24  ALKPHOS 56 68  BILITOT 0.6 0.8  PROT 6.0* 6.9  ALBUMIN 2.7* 3.3*   CBC: Recent Labs  Lab 06/12/20 0327 06/17/20 1040 06/18/20 0523  WBC 6.1 16.0* 8.9  NEUTROABS  --  13.3*  --   HGB 8.8* 10.7* 10.0*  HCT 28.7* 32.9* 30.9*  MCV 93.8 91.4 91.7  PLT 268 461* 414*   BNP (last 3 results) Recent Labs    06/08/20 1831  BNP 79.2      Recent Results (from the past 240  hour(s))  SARS Coronavirus 2 by RT PCR (hospital order, performed in [facility] hospital lab) Nasopharyngeal Nasopharyngeal Swab     Status: Abnormal   Collection Time: 06/08/20  6:37 PM   Specimen: Nasopharyngeal Swab  Result Value Ref Range Status   SARS Coronavirus 2 POSITIVE (A) NEGATIVE Final    Comment: CRITICAL RESULT CALLED TO, READ BACK BY AND VERIFIED WITH: DR. Regenia Skeeter @ 2120 06/08/20 BY TURNER,S. (NOTE) SARS-CoV-2 target nucleic acids are DETECTED  SARS-CoV-2 RNA is generally detectable in upper respiratory specimens  during the acute phase of infection.  Positive results are indicative  of the presence of the identified virus, but do not rule out bacterial infection or co-infection with other pathogens not detected by the test.  Clinical correlation with patient history and  other diagnostic information is necessary to determine patient infection status.  The expected  result is negative.  Fact Sheet for Patients:   StrictlyIdeas.no   Fact Sheet for Healthcare Providers:   BankingDealers.co.za    This test is not yet approved or cleared by the [place] FDA and  has been authorized for detection and/or diagnosis of SARS-CoV-2 by FDA under an Emergency Use Authorization (EUA).  This EUA will remain in effect ( meaning this test can be used) for the duration of  the COVID-19 declaration under Section 564(b)(1) of the Act, 21 U.S.C. section 360-bbb-3(b)(1), unless the authorization is terminated or revoked sooner.  Performed at [facility], [place] [address], [place], [place] [address]   Blood Culture (routine x 2)     Status: None   Collection Time: 06/08/20  9:21 PM   Specimen: BLOOD  Result Value Ref Range Status   Specimen Description   Final    BLOOD RIGHT WRIST Performed at [facility] [address] [place], [place] [address]    Special Requests   Final    BOTTLES DRAWN AEROBIC AND ANAEROBIC Blood Culture adequate volume Performed at [facility] [address] [place], [place] [address]    Culture   Final    NO GROWTH 5 DAYS Performed at [facility], [place] [address], [place], [place] [address]    Report Status 06/14/2020 FINAL  Final  Blood Culture (routine x 2)     Status: None   Collection Time: 06/08/20  9:26 PM   Specimen: BLOOD RIGHT FOREARM  Result Value Ref Range Status   Specimen Description   Final    BLOOD RIGHT FOREARM Performed at [facility] [address] [place], [place] [address]    Special Requests   Final    BOTTLES DRAWN AEROBIC AND ANAEROBIC Blood Culture adequate volume Performed at [facility] [address], [place], [place] [address]    Culture   Final    NO GROWTH 5 DAYS Performed at [facility], [place] [address] [place], [place]  [address]    Report Status 06/14/2020 FINAL  Final     Studies: DG Chest 1 View  Result Date: 06/17/2020 CLINICAL DATA:  History of metastatic lung cancer. Altered mental status. EXAM: CHEST  1 VIEW COMPARISON:  June 11, 2020. FINDINGS: The heart size and mediastinal contours are within normal limits. Left lung is clear. Right upper lobe irregular density is again noted consistent with malignancy. No pneumothorax or pleural effusion is noted. The visualized skeletal structures are unremarkable. IMPRESSION: Stable right upper lobe density consistent with malignancy. No other abnormality seen  in the chest. Electronically Signed   By: Marijo Conception M.D.   On: 06/17/2020 13:18   CT HEAD WO CONTRAST  Result Date: 06/17/2020 CLINICAL DATA:  Altered mental status. History of metastatic lung cancer. EXAM: CT HEAD WITHOUT CONTRAST TECHNIQUE: Contiguous axial images were obtained from the base of the skull through the vertex without intravenous contrast. COMPARISON:  May 29, 2020. FINDINGS: Brain: Mild chronic ischemic white matter disease is noted. No mass effect or midline shift is noted. Ventricular size is within normal limits. There is no evidence of mass lesion, hemorrhage or acute infarction. Possible metastatic lesions noted on recent MRI are not visualized on this study. Vascular: No hyperdense vessel or unexpected calcification. Skull: No definite fracture is noted. Multiple small rounded lucencies are noted throughout the skull which may represent benign venous lakes, but metastatic disease cannot be excluded given the history of metastatic lung cancer. Sinuses/Orbits: No acute finding. Other: None. IMPRESSION: 1. Mild chronic ischemic white matter disease. No acute intracranial abnormality seen. 2. Multiple small rounded lucencies are noted throughout the skull which may represent benign venous lakes, but metastatic disease cannot be excluded given the history of metastatic lung cancer. Electronically  Signed   By: Marijo Conception M.D.   On: 06/17/2020 11:59   MR BRAIN W WO CONTRAST  Result Date: 06/17/2020 CLINICAL DATA:  Confusion with right arm and leg weakness. History of metastatic lung cancer. EXAM: MRI HEAD WITHOUT AND WITH CONTRAST TECHNIQUE: Multiplanar, multiecho pulse sequences of the brain and surrounding structures were obtained without and with intravenous contrast. CONTRAST:  5mL GADAVIST GADOBUTROL 1 MMOL/ML IV SOLN COMPARISON:  Head CT 06/17/2020 and MRI 05/29/2020 FINDINGS: Brain: There are patchy acute to subacute infarcts in the left MCA territory, overall moderate in volume with greatest involvement of the lateral frontal and parietal cortex. There is also border zone involvement in the lateral left occipital lobe. Associated small volume petechial blood products are noted in the left frontoparietal and left occipital infarcts, and there is also an unchanged chronic microhemorrhage in the posterior left temporal lobe. Punctate acute infarcts are present in the left basal ganglia. Periventricular white matter T2 hyperintensities are unchanged from the prior MRI and are nonspecific but compatible with mild chronic small vessel ischemic disease. Chronic lacunar infarcts are noted in the thalami and right lentiform nucleus. An unchanged T2 hyperintense focus in the right midbrain may represent a dilated perivascular space or chronic lacunar infarct. There is mild cerebral atrophy. No midline shift or extra-axial fluid collection is evident. A plaque-like 3 mm enhancing focus along the inferior right cerebellar hemisphere is unchanged from the prior MRI (series 21, image 31). The punctate enhancing focus in the medial left cerebellar hemisphere on the prior MRI is not evident today. A 2 mm focus of cortical enhancement in left frontoparietal cortex is secondary to the recent infarct. Vascular: Major intracranial vascular flow voids are preserved. Skull and upper cervical spine: Small enhancing  foci in the clivus are unchanged (series 21, image 38). A 2 cm T1 hypointense lesion in the right parietal skull including a punctate focus of internal enhancement is also unchanged (series 16, image 129 and series 21, image 134). Sinuses/Orbits: Unremarkable orbits. Mild left maxillary sinus mucosal thickening. Small right mastoid effusion. Other: None. IMPRESSION: 1. Patchy acute to subacute left cerebral hemispheric infarcts primarily in the MCA territory. 2. Unchanged 3 mm enhancing focus in the right cerebellum, possibly a metastasis although a subacute infarct is an alternative consideration.  3. Nonvisualization of the punctate enhancing focus in the left cerebellum on the prior MRI. No evidence of new intracranial metastases. 4. Unchanged small enhancing calvarial lesions. Electronically Signed   By: Logan Bores M.D.   On: 06/17/2020 20:06   US Carotid Bilateral  Result Date: 06/18/2020 CLINICAL DATA:  Acute cerebral infarcts. EXAM: BILATERAL CAROTID DUPLEX ULTRASOUND TECHNIQUE: Pearline Cables scale imaging, color Doppler and duplex ultrasound were performed of bilateral carotid and vertebral arteries in the neck. COMPARISON:  None. FINDINGS: Criteria: Quantification of carotid stenosis is based on velocity parameters that correlate the residual internal carotid diameter with NASCET-based stenosis levels, using the diameter of the distal internal carotid lumen as the denominator for stenosis measurement. The following velocity measurements were obtained: RIGHT ICA:  133/51 cm/sec CCA:  36/64 cm/sec SYSTOLIC ICA/CCA RATIO:  1.4 ECA:  199 cm/sec LEFT ICA:  114/37 cm/sec CCA:  403/47 cm/sec SYSTOLIC ICA/CCA RATIO:  1.1 ECA:  168 cm/sec RIGHT CAROTID ARTERY: Mild to moderate calcified plaque at the level of the distal bulb and proximal right ICA. Estimated right ICA stenosis is 50-69%. RIGHT VERTEBRAL ARTERY: Antegrade flow with normal waveform and velocity. LEFT CAROTID ARTERY: Mild to moderate predominately calcified  plaque at the level of the left carotid bulb and proximal left ICA. Estimated left ICA stenosis is less than 50% based on velocities. LEFT VERTEBRAL ARTERY: Antegrade flow with normal waveform and velocity. IMPRESSION: Mild to moderate calcified plaque at the level of bilateral carotid bulbs and proximal internal carotid arteries. Estimated right ICA stenosis is 50-69% and estimated left ICA stenosis is less than 50%. Electronically Signed   By: Aletta Edouard M.D.   On: 06/18/2020 09:25    Scheduled Meds: . aspirin EC  325 mg Oral Daily  . atorvastatin  40 mg Oral QPM  . dexamethasone (DECADRON) injection  4 mg Intravenous Q6H  . enoxaparin (LOVENOX) injection  40 mg Subcutaneous Q24H  . gabapentin  300 mg Oral BID  . losartan  50 mg Oral Daily   And  . hydrochlorothiazide  12.5 mg Oral Daily  . mometasone-formoterol  2 puff Inhalation BID   And  . umeclidinium bromide  1 puff Inhalation Daily  . oxyCODONE  10 mg Oral Q8H  . pantoprazole  40 mg Oral Daily  . sodium chloride flush  3 mL Intravenous Once  . sodium chloride flush  3 mL Intravenous Q12H  . sucralfate  1 g Oral TID WC & HS  . traZODone  50 mg Oral QHS   Continuous Infusions: . sodium chloride      Assessment/Plan:  1. 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  Case discussed with neurology.  Okay with aspirin for anticoagulation.  Add Lipitor.  Patient does have weakness right hand and right ankle.  Will get PT, OT, speech therapy evaluations.  Echocardiogram pending.  Telemetry monitoring.  Lipid panel added on and LDL good at 41. 2. 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  Oncology to see the patient also placed a palliative care consultation.  Patient already a DNR. 3. Acute on chronic hypoxic respiratory failure.  The patient was on 7 L of oxygen when I saw him.  Looks like he was dialed down to 5 L.  We will get an ABG just to see if he is retaining CO2. 4. Acute  metabolic encephalopathy.  Answer some yes or no questions could be secondary to stroke, brain metastases, steroids.  We will  get an ABG to see if he has CO2 retention.  We will give trazodone at night to get him sleeping. 5. Recent COVID-19 infection treated with 5 days of remdesivir.  Continue Decadron here.  Also had baricitinib but was discontinued upon disposition.  Pharmacist to look into whether or not to continue baricitinib or not. 6. Chronic pain on OxyContin and oxycodone 7. Essential hypertension on losartan HCT        Code Status:     Code Status Orders  (From admission, onward)         Start     Ordered   06/17/20 1511  Do not attempt resuscitation (DNR)  Continuous       Question Answer Comment  In the event of cardiac or respiratory ARREST Do not call a "code blue"   In the event of cardiac or respiratory ARREST Do not perform Intubation, CPR, defibrillation or ACLS   In the event of cardiac or respiratory ARREST Use medication by any route, position, wound care, and other measures to relive pain and suffering. May use oxygen, suction and manual treatment of airway obstruction as needed for comfort.      06/17/20 1515        Code Status History    Date Active Date Inactive Code Status Order ID Comments User Context   06/08/2020 2259 06/12/2020 1823 Full Code [id number]  Rhetta Mura DO ED   05/16/2020 1940 05/17/2020 2327 Full Code [id number]  Lenore Cordia, MD ED   Advance Care Planning Activity    Advance Directive Documentation     Most Recent Value  Type of Advance Directive Healthcare Power of [place], Living will  Pre-existing out of facility DNR order (yellow form or pink MOST form) --  "MOST" Form in Place? --     Family Communication: Family at bedside Disposition Plan: Status is: Inpatient  Dispo: The patient is from: Home              Anticipated d/c is to: Home with home health              Anticipated d/c date is: We will need to see how he  does with physical therapy and response to treatment.  Likely will need a few days here in the hospital              Patient currently being treated for acute stroke, acute on chronic hypoxic respiratory failure.  Also has brain metastases.  Consultants:  Neurology  Oncology  Palliative care  Time spent: 30 minutes  [place]

## 2020-06-18 NOTE — ED Notes (Signed)
Pt cleaned of incontinent urine episode by Su Grand. Pt clean and dry. Daughter bedside.

## 2020-06-18 NOTE — ED Notes (Signed)
Pt agitated at this time, will follow prn orders

## 2020-06-18 NOTE — Progress Notes (Signed)
*  PRELIMINARY RESULTS* Echocardiogram 2D Echocardiogram has been performed.  Douglas [name] 06/18/2020, 4:49 PM

## 2020-06-18 NOTE — ED Notes (Signed)
Pt assisted with urinal. 333mL urine emptied from urinal. Pt repositioned. When asked about pain pt said "yes". Pt is unable to verbalize where pain is (see PAINAD scale).  Pt's daughter states "we give him oxycodone every 4 hours" and stated that she would like for [place] to provide PRN pain medication when available.

## 2020-06-18 NOTE — Evaluation (Signed)
Occupational Therapy Evaluation Patient Details Name: Douglas [name] MRN: [id number] DOB: September 21, 1956 Today's Date: 06/18/2020    History of Present Illness Douglas [name] is a 64 y.o. male with multiple medical problems including COPD, tobacco abuse, hypertension, hyperlipidemia, who was hospitalized 05/16/2020-05/17/2020 with dysphagia and unintentional weight loss. Patient was found to have a right lung mass, right adrenal lesion, and diffuse circumferential esophageal thickening on CT. Barium swallow on 05/17/2020 revealed reflux and esophagitis. 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  Patient is now hospitalized 06/17/2020 with altered mental status.  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  He is referred to palliative care to help address goals and manage ongoing symptoms   Clinical Impression   Patient presenting with decreased I in self care, balance, functional mobility/transfers, endurance, and R UE/LE strength and coordination. Pt's daughter present to provide information during session.Patient was independent and  Living at home alone PTA. Caregiver reports wishing to take him home with this admission as well. Pt has been more restless and agitated today per caregiver report. Pt requires mod multimodal cuing and increased time to follow 1 step commands inconsistently. Pt does have visual involvement but unable to follow formal assessment. R inattention during functional tasks this session. Pt performing supine >sit with min guard and standing with several steps min - mod A for posterior bias. No buckling noted on R LE. Pt able to pull B socks up with L hand this session. Pt also very emotional at end of session in regards to wanting to return home. Patient will benefit  from acute OT to increase overall independence in the areas of ADLs, functional mobility, and safety awareness in order to safely discharge home with caregiver.    Follow Up Recommendations  Home health OT;Supervision/Assistance - 24 hour    Equipment Recommendations  3 in 1 bedside commode    Recommendations for Other Services Other (comment) (none at this time)     Precautions / Restrictions Precautions Precautions: Fall Precaution Comments: monitor O2      Mobility Bed Mobility Overal bed mobility: Needs Assistance Bed Mobility: Rolling;Supine to Sit;Sit to Supine Rolling: Min guard   Supine to sit: Min guard Sit to supine: Min guard   General bed mobility comments: for safety  Transfers Overall transfer level: Needs assistance Equipment used: 1 person hand held assist Transfers: Sit to/from Stand Sit to Stand: Min assist         General transfer comment: min A for safety with posterior bias    Balance Overall balance assessment: Needs assistance Sitting-balance support: Feet supported;No upper extremity supported;Single extremity supported Sitting balance-Leahy Scale: Fair     Standing balance support: During functional activity Standing balance-Leahy Scale: Poor Standing balance comment: reliance on therapist with posterior bias        ADL either performed or assessed with clinical judgement   ADL Overall ADL's : Needs assistance/impaired     Grooming: Wash/dry hands;Wash/dry face;Minimal assistance;Sitting   Upper Body Bathing: Minimal assistance;Sitting   Lower Body Bathing: Moderate assistance;Sit to/from stand   Upper Body Dressing : Minimal assistance;Sitting   Lower Body Dressing: Moderate assistance;Sit to/from stand     General ADL Comments: min A sit <>stand, Min A to don UB clothing with R inattenion, Mod A for LB dressing     Vision Baseline  Vision/History: Wears glasses Wears Glasses: Reading only Patient Visual Report: No  change from baseline Vision Assessment?: Vision impaired- to be further tested in functional context Additional Comments: pt unable to follow formal assessment. Pt does not visually track or look at items to the R during this session. He also needed mod - max multimodal cuing to attend to task            Pertinent Vitals/Pain Pain Assessment: Faces Faces Pain Scale: No hurt     Hand Dominance Right   Extremity/Trunk Assessment Upper Extremity Assessment Upper Extremity Assessment: RUE deficits/detail RUE Deficits / Details: Pt does not follow any commands for MMT this session and does not use functional during session   Lower Extremity Assessment Lower Extremity Assessment: Defer to PT evaluation   Cervical / Trunk Assessment Cervical / Trunk Assessment: Kyphotic   Communication Communication Communication: Expressive difficulties   Cognition Arousal/Alertness: Awake/alert Behavior During Therapy: Flat affect Overall Cognitive Status: Impaired/Different from baseline Area of Impairment: Following commands;Safety/judgement;Awareness;Problem solving;Attention         Current Attention Level: Sustained   Following Commands: Follows one step commands with increased time;Follows one step commands inconsistently Safety/Judgement: Decreased awareness of safety;Decreased awareness of deficits Awareness: Intellectual Problem Solving: Slow processing;Decreased initiation;Difficulty sequencing;Requires verbal cues General Comments: min - mod multimodal cuing              Home Living Family/patient expects to be discharged to:: Private residence   Available Help at Discharge: Family;Available 24 hours/day Type of Home: Apartment Home Access: Stairs to enter Entrance Stairs-Number of Steps: 3   Home Layout: One level     Bathroom Shower/Tub: Teacher, early years/pre: Standard Bathroom Accessibility: Yes   Home Equipment: Bedside commode          Prior  Functioning/Environment Level of Independence: Independent              OT Problem List: Decreased strength;Decreased coordination;Decreased range of motion;Decreased cognition;Impaired sensation;Decreased activity tolerance;Decreased safety awareness;Impaired tone;Impaired balance (sitting and/or standing);Decreased knowledge of use of DME or AE;Impaired vision/perception;Decreased knowledge of precautions;Impaired UE functional use      OT Treatment/Interventions: Self-care/ADL training;Therapeutic exercise;Therapeutic activities;Neuromuscular education;Cognitive remediation/compensation;Energy conservation;Visual/perceptual remediation/compensation;DME and/or AE instruction;Patient/family education;Balance training    OT Goals(Current goals can be found in the care plan section) Acute Rehab OT Goals Patient Stated Goal: home OT Goal Formulation: With patient/family Time For Goal Achievement: 07/02/20 Potential to Achieve Goals: Good ADL Goals Pt Will Perform Grooming: with supervision;standing Pt Will Transfer to Toilet: with min guard assist Pt Will Perform Toileting - Clothing Manipulation and hygiene: with min guard assist  OT Frequency: Min 1X/week   Barriers to D/C: Other (comment)  none known at this time          AM-PAC OT "6 Clicks" Daily Activity     Outcome Measure Help from another person eating meals?: A Lot Help from another person taking care of personal grooming?: A Little Help from another person toileting, which includes using toliet, bedpan, or urinal?: A Lot Help from another person bathing (including washing, rinsing, drying)?: A Lot Help from another person to put on and taking off regular upper body clothing?: A Little Help from another person to put on and taking off regular lower body clothing?: A Lot 6 Click Score: 14   End of Session Equipment Utilized During Treatment: Oxygen Nurse Communication: Mobility status  Activity Tolerance: Patient  tolerated treatment well Patient left: in bed;with call bell/phone within reach;with family/visitor  present  OT Visit Diagnosis: Unsteadiness on feet (R26.81);Muscle weakness (generalized) (M62.81);Hemiplegia and hemiparesis Hemiplegia - Right/Left: Right Hemiplegia - dominant/non-dominant: Dominant Hemiplegia - caused by: Cerebral infarction                Time: 6063-0160 OT Time Calculation (min): 33 min Charges:  OT General Charges $OT Visit: 1 Visit OT Evaluation $OT Eval Moderate Complexity: 1 Mod OT Treatments $Self Care/Home Management : 8-22 mins  Darleen Crocker, MS, OTR/L , CBIS ascom [phone]  06/18/20, 4:25 PM

## 2020-06-18 NOTE — Consult Note (Signed)
[facility]  Date of admission:  06/17/2020  Inpatient day:  06/18/2020  Consulting physician: Dr Royce Macadamia Agbata   Reason for Consultation:  AMS  Chief Complaint: Douglas [name] is a 64 y.o. male with COPD and metastatic adenocarcinoma of the lung who is admitted through the emergency room with altered mental status and an acute left cerebral infarct.  HPI:  The patient has a 50 pack year smoking history.  He presented with dysphagia and a RUL mass.  PET scan revealed a 2.2 cm RUL hypermetabolic lesion with associated bilateral mediastinal nodes, bilateral adrenal lesions and multiple lytic bone lesions.   Right sacral ala biopsy confirmed adenocarcinoma. PDL1 was 0%.  Foundation One is pending.  Head MRI revealed sub-5 mm bilateral cerebellar enhancing foci concerning for metastases. There was sub-5 mm right parietal bone and basilar occipital enhancing foci concerning for osseous metastases.   Cervical, lumbar and sacral MRIs  revealed multiple metastatic lesions.  The largest was a 4.4 cm mass in the right sacrum and 4.8 cm right ilium mass.  He was referred to radiation oncology for palliative radiation. Plan was for 3000 cGy in 10 fractions.  He underwent simulation on 06/05/2020, but has not started radiation.  He was admitted to [facility] from 06/08/2020- 06/12/2020 with progressive shortness of breath and weakness.  He was COVID-19 positive.  He was treated with oxygen, IV steroids, remdesvir x 5 days and baricitinib.  He was discharged on 5-6 liters of oxygen and Decadron 6 mg/day x 1 week.  He presented to the ER with mental status changes.  He was agitated and confused.  He fell prior to presentation.  His daughter noted that his legs were weak and had difficulty walking.  He had right hand weakness.  He he had no fever.  He has baseline shortness of breath on 5 L of oxygen via nasal cannula.  He denied any pain.  Head CT on 06/17/2020 revealed mild  chronic ischemic white matter disease with no acute intracranial abnormality.  There are multiple small round lucencies throughout the skull.  Dr. Rogue Bussing was initially consulted.  Given concern for possible cerebral edema he recommended Decadron 10 mg followed by 4 mg IV every 6 hours   Head MRI on 06/17/2020 revealed patchy acute to subacute left cerebral hemispheric infarcts primarily in the MCA territory.  There was an unchanged 3 mm enhancing focus in the right cerebellum, possibly a metastasis vs a subacute infarct.  There was nonvisualization of the punctate enhancing focus in the left cerebellum on the prior MRI.  There were no evidence of new intracranial metastases.  There were unchanged small enhancing calvarial lesions.  He has been seen by Dr Kerney Elbe from neurology.  Exam revealed receptive and expressive aphasia in conjunction with right-sided weakness and cortical blindness.  PT, OT and speech therapy were recommended.  Echo is pending.  Aspirin was recommended.  Symptomatically, he remains confused and agitated.   Past Medical History:  Diagnosis Date  . Cancer ([place])    lung with brain mets  . COPD (chronic obstructive pulmonary disease) ([place])   . Hyperlipidemia   . Hypertension   . Lesion of adrenal gland ([place])   . Lung mass     Past Surgical History:  Procedure Laterality Date  . LUNG BIOPSY  August '21    Family History  Problem Relation Age of Onset  . Cancer Neg Hx     Social History:  reports that  he has been smoking cigarettes. He started smoking about 45 years ago. He has a 22.50 pack-year smoking history. He has never used smokeless tobacco. He reports current alcohol use.  Drug: Marijuana.  The patient denies any exposure to radiation or toxins.  He previously worked for a Adult [employer].  The patient lives in [place] with his stepdaughter.  He has sisters who live out of state.  His sister, Douglas [name] [phone]), is most involved.  He does not have a  healthcare power of attorney.  Per notes from [facility], she requested that all medical decisions be deferred to the patient's stepdaughter.  He is accompanied in the ER with his stepdaughter.  Allergies: No Known Allergies  (Not in a hospital admission)  Review of Systems: He is confused and agitated.  He has right-sided weakness.  Physical Exam:  Blood pressure (!) 150/99, pulse 68, temperature 99.3 F (37.4 C), temperature source Oral, resp. rate 14, height 5' 5" (1.651 m), weight 150 lb 2.1 oz (68.1 kg), SpO2 100 %.  GENERAL: Chronically fatigued appearing gentleman sitting upright in bed in the emergency room. MENTAL STATUS: He is confused and agitated. HEAD: Shoulderlength dark hair.  Normocephalic, atraumatic, face symmetric, no Cushingoid features.   Results for orders placed or performed during the hospital encounter of 06/17/20 (from the past 48 hour(s))  Protime-INR     Status: None   Collection Time: 06/17/20 10:40 AM  Result Value Ref Range   Prothrombin Time 12.9 11.4 - 15.2 seconds   INR 1.0 0.8 - 1.2    Comment: (NOTE) INR goal varies based on device and disease states. Performed at [facility], [address] [place], [place] [address]   APTT     Status: None   Collection Time: 06/17/20 10:40 AM  Result Value Ref Range   aPTT 24 24 - 36 seconds    Comment: Performed at [facility], [address] [place], [place] [address]  CBC     Status: Abnormal   Collection Time: 06/17/20 10:40 AM  Result Value Ref Range   WBC 16.0 (H) 4.0 - 10.5 K/uL   RBC 3.60 (L) 4.22 - 5.81 MIL/uL   Hemoglobin 10.7 (L) 13.0 - 17.0 g/dL   HCT 32.9 (L) 39 - 52 %   MCV 91.4 80.0 - 100.0 fL   MCH 29.7 26.0 - 34.0 pg   MCHC 32.5 30.0 - 36.0 g/dL   RDW 13.7 11.5 - 15.5 %   Platelets 461 (H) 150 - 400 K/uL   nRBC 0.0 0.0 - 0.2 %    Comment: Performed at [facility], [address] [place], [place] [address]  Differential     Status: Abnormal    Collection Time: 06/17/20 10:40 AM  Result Value Ref Range   Neutrophils Relative % 82 %   Neutro Abs 13.3 (H) 1.7 - 7.7 K/uL   Lymphocytes Relative 8 %   Lymphs Abs 1.2 0.7 - 4.0 K/uL   Monocytes Relative 8 %   Monocytes Absolute 1.2 (H) 0 - 1 K/uL   Eosinophils Relative 1 %   Eosinophils Absolute 0.2 0 - 0 K/uL   Basophils Relative 0 %   Basophils Absolute 0.0 0 - 0 K/uL   Immature Granulocytes 1 %   Abs Immature Granulocytes 0.15 (H) 0.00 - 0.07 K/uL    Comment: Performed at [facility], [address] [place], [place] [address]  Comprehensive metabolic panel     Status: Abnormal  Collection Time: 06/17/20 10:40 AM  Result Value Ref Range   Sodium 139 135 - 145 mmol/L   Potassium 3.8 3.5 - 5.1 mmol/L   Chloride 94 (L) 98 - 111 mmol/L   CO2 33 (H) 22 - 32 mmol/L   Glucose, Bld 98 70 - 99 mg/dL    Comment: Glucose reference range applies only to samples taken after fasting for at least 8 hours.   BUN 15 8 - 23 mg/dL   Creatinine, Ser 1.05 0.61 - 1.24 mg/dL   Calcium 8.9 8.9 - 10.3 mg/dL   Total Protein 6.9 6.5 - 8.1 g/dL   Albumin 3.3 (L) 3.5 - 5.0 g/dL   AST 23 15 - 41 U/L   ALT 24 0 - 44 U/L   Alkaline Phosphatase 68 38 - 126 U/L   Total Bilirubin 0.8 0.3 - 1.2 mg/dL   GFR calc non Af Amer >60 >60 mL/min   GFR calc Af Amer >60 >60 mL/min   Anion gap 12 5 - 15    Comment: Performed at [facility], [address] [place], [place] [address]  Magnesium     Status: None   Collection Time: 06/17/20 10:40 AM  Result Value Ref Range   Magnesium 2.3 1.7 - 2.4 mg/dL    Comment: Performed at [facility], [address] [place], [place] [address]  TSH     Status: None   Collection Time: 06/17/20 10:40 AM  Result Value Ref Range   TSH 1.628 0.350 - 4.500 uIU/mL    Comment: Performed by a 3rd Generation assay with a functional sensitivity of <=0.01 uIU/mL. Performed at [facility], [place], [place] [address]    Troponin I (High Sensitivity)     Status: None   Collection Time: 06/17/20 10:40 AM  Result Value Ref Range   Troponin I (High Sensitivity) 9 <18 ng/L    Comment: (NOTE) Elevated high sensitivity troponin I (hsTnI) values and significant  changes across serial measurements may suggest ACS but many other  chronic and acute conditions are known to elevate hsTnI results.  Refer to the "Links" section for chest pain algorithms and additional  guidance. Performed at [facility], [address] [place], [place] [address]   Procalcitonin - Baseline     Status: None   Collection Time: 06/17/20 10:40 AM  Result Value Ref Range   Procalcitonin <0.10 ng/mL    Comment:        Interpretation: PCT (Procalcitonin) <= 0.5 ng/mL: Systemic infection (sepsis) is not likely. Local bacterial infection is possible. (NOTE)       Sepsis PCT Algorithm           Lower Respiratory Tract                                      Infection PCT Algorithm    ----------------------------     ----------------------------         PCT < 0.25 ng/mL                PCT < 0.10 ng/mL          Strongly encourage             Strongly discourage   discontinuation of antibiotics    initiation of antibiotics    ----------------------------     -----------------------------       PCT  0.25 - 0.50 ng/mL            PCT 0.10 - 0.25 ng/mL               OR       >80% decrease in PCT            Discourage initiation of                                            antibiotics      Encourage discontinuation           of antibiotics    ----------------------------     -----------------------------         PCT >= 0.50 ng/mL              PCT 0.26 - 0.50 ng/mL               AND        <80% decrease in PCT             Encourage initiation of                                             antibiotics       Encourage continuation           of antibiotics    ----------------------------     -----------------------------         PCT >= 0.50 ng/mL                  PCT > 0.50 ng/mL               AND         increase in PCT                  Strongly encourage                                      initiation of antibiotics    Strongly encourage escalation           of antibiotics                                     -----------------------------                                           PCT <= 0.25 ng/mL                                                 OR                                        > 80% decrease in PCT  Discontinue / Do not initiate                                             antibiotics  Performed at [facility], [address] [place], [place] [address]   Urinalysis, Complete w Microscopic     Status: Abnormal   Collection Time: 06/17/20 12:33 PM  Result Value Ref Range   Color, Urine STRAW (A) YELLOW   APPearance CLEAR (A) CLEAR   Specific Gravity, Urine 1.006 1.005 - 1.030   pH 7.0 5.0 - 8.0   Glucose, UA NEGATIVE NEGATIVE mg/dL   Hgb urine dipstick NEGATIVE NEGATIVE   Bilirubin Urine NEGATIVE NEGATIVE   Ketones, ur NEGATIVE NEGATIVE mg/dL   Protein, ur NEGATIVE NEGATIVE mg/dL   Nitrite NEGATIVE NEGATIVE   Leukocytes,Ua NEGATIVE NEGATIVE   RBC / HPF 0-5 0 - 5 RBC/hpf   WBC, UA NONE SEEN 0 - 5 WBC/hpf   Bacteria, UA NONE SEEN NONE SEEN   Squamous Epithelial / LPF 0-5 0 - 5    Comment: Performed at [facility], [address] [place], [place] [address]  Basic metabolic panel     Status: Abnormal   Collection Time: 06/18/20  5:23 AM  Result Value Ref Range   Sodium 140 135 - 145 mmol/L   Potassium 4.4 3.5 - 5.1 mmol/L   Chloride 98 98 - 111 mmol/L   CO2 31 22 - 32 mmol/L   Glucose, Bld 118 (H) 70 - 99 mg/dL    Comment: Glucose reference range applies only to samples taken after fasting for at least 8 hours.   BUN 15 8 - 23 mg/dL   Creatinine, Ser 0.95 0.61 - 1.24 mg/dL   Calcium 8.8 (L) 8.9 - 10.3 mg/dL   GFR calc non Af Amer  >60 >60 mL/min   GFR calc Af Amer >60 >60 mL/min   Anion gap 11 5 - 15    Comment: Performed at [facility], [address] [place], [place] [address]  CBC     Status: Abnormal   Collection Time: 06/18/20  5:23 AM  Result Value Ref Range   WBC 8.9 4.0 - 10.5 K/uL   RBC 3.37 (L) 4.22 - 5.81 MIL/uL   Hemoglobin 10.0 (L) 13.0 - 17.0 g/dL   HCT 30.9 (L) 39 - 52 %   MCV 91.7 80.0 - 100.0 fL   MCH 29.7 26.0 - 34.0 pg   MCHC 32.4 30.0 - 36.0 g/dL   RDW 13.8 11.5 - 15.5 %   Platelets 414 (H) 150 - 400 K/uL   nRBC 0.0 0.0 - 0.2 %    Comment: Performed at [facility], [address] [place], [place] [address]  Lipid panel     Status: None   Collection Time: 06/18/20  5:23 AM  Result Value Ref Range   Cholesterol 124 0 - 200 mg/dL   Triglycerides 126 <150 mg/dL   HDL 58 >40 mg/dL   Total CHOL/HDL Ratio 2.1 RATIO   VLDL 25 0 - 40 mg/dL   LDL Cholesterol 41 0 - 99 mg/dL    Comment:        Total Cholesterol/HDL:CHD Risk Coronary Heart Disease Risk Table                     Men   Women  1/2  Average Risk   3.4   3.3  Average Risk       5.0   4.4  2 X Average Risk   9.6   7.1  3 X Average Risk  23.4   11.0        Use the calculated Patient Ratio above and the CHD Risk Table to determine the patient's CHD Risk.        ATP III CLASSIFICATION (LDL):  <100     mg/dL   Optimal  100-129  mg/dL   Near or Above                    Optimal  130-159  mg/dL   Borderline  160-189  mg/dL   High  >190     mg/dL   Very High Performed at [facility], [address] [place], [place] [address]   Blood gas, arterial     Status: Abnormal   Collection Time: 06/18/20 12:55 PM  Result Value Ref Range   FIO2 0.60     Comment: PATIENT ON 1OL HFNC   pH, Arterial 7.39 7.35 - 7.45   pCO2 arterial 50 (H) 32 - 48 mmHg   pO2, Arterial 223 (H) 83 - 108 mmHg   Bicarbonate 30.3 (H) 20.0 - 28.0 mmol/L   Acid-Base Excess 4.6 (H) 0.0 - 2.0 mmol/L   O2 Saturation 99.8 %    Patient temperature 37.0    Collection site RIGHT RADIAL    Sample type ARTERIAL DRAW    Allens test (pass/fail) PASS PASS    Comment: Performed at [facility], [address], [place],  [address]   DG Chest 1 View  Result Date: 06/17/2020 CLINICAL DATA:  History of metastatic lung cancer. Altered mental status. EXAM: CHEST  1 VIEW COMPARISON:  June 11, 2020. FINDINGS: The heart size and mediastinal contours are within normal limits. Left lung is clear. Right upper lobe irregular density is again noted consistent with malignancy. No pneumothorax or pleural effusion is noted. The visualized skeletal structures are unremarkable. IMPRESSION: Stable right upper lobe density consistent with malignancy. No other abnormality seen in the chest. Electronically Signed   By: Marijo Conception M.D.   On: 06/17/2020 13:18   CT HEAD WO CONTRAST  Result Date: 06/17/2020 CLINICAL DATA:  Altered mental status. History of metastatic lung cancer. EXAM: CT HEAD WITHOUT CONTRAST TECHNIQUE: Contiguous axial images were obtained from the base of the skull through the vertex without intravenous contrast. COMPARISON:  May 29, 2020. FINDINGS: Brain: Mild chronic ischemic white matter disease is noted. No mass effect or midline shift is noted. Ventricular size is within normal limits. There is no evidence of mass lesion, hemorrhage or acute infarction. Possible metastatic lesions noted on recent MRI are not visualized on this study. Vascular: No hyperdense vessel or unexpected calcification. Skull: No definite fracture is noted. Multiple small rounded lucencies are noted throughout the skull which may represent benign venous lakes, but metastatic disease cannot be excluded given the history of metastatic lung cancer. Sinuses/Orbits: No acute finding. Other: None. IMPRESSION: 1. Mild chronic ischemic white matter disease. No acute intracranial abnormality seen. 2. Multiple small rounded lucencies are noted  throughout the skull which may represent benign venous lakes, but metastatic disease cannot be excluded given the history of metastatic lung cancer. Electronically Signed   By: Marijo Conception M.D.   On: 06/17/2020 11:59   MR BRAIN W WO CONTRAST  Result Date: 06/17/2020  CLINICAL DATA:  Confusion with right arm and leg weakness. History of metastatic lung cancer. EXAM: MRI HEAD WITHOUT AND WITH CONTRAST TECHNIQUE: Multiplanar, multiecho pulse sequences of the brain and surrounding structures were obtained without and with intravenous contrast. CONTRAST:  46m GADAVIST GADOBUTROL 1 MMOL/ML IV SOLN COMPARISON:  Head CT 06/17/2020 and MRI 05/29/2020 FINDINGS: Brain: There are patchy acute to subacute infarcts in the left MCA territory, overall moderate in volume with greatest involvement of the lateral frontal and parietal cortex. There is also border zone involvement in the lateral left occipital lobe. Associated small volume petechial blood products are noted in the left frontoparietal and left occipital infarcts, and there is also an unchanged chronic microhemorrhage in the posterior left temporal lobe. Punctate acute infarcts are present in the left basal ganglia. Periventricular white matter T2 hyperintensities are unchanged from the prior MRI and are nonspecific but compatible with mild chronic small vessel ischemic disease. Chronic lacunar infarcts are noted in the thalami and right lentiform nucleus. An unchanged T2 hyperintense focus in the right midbrain may represent a dilated perivascular space or chronic lacunar infarct. There is mild cerebral atrophy. No midline shift or extra-axial fluid collection is evident. A plaque-like 3 mm enhancing focus along the inferior right cerebellar hemisphere is unchanged from the prior MRI (series 21, image 31). The punctate enhancing focus in the medial left cerebellar hemisphere on the prior MRI is not evident today. A 2 mm focus of cortical enhancement in left  frontoparietal cortex is secondary to the recent infarct. Vascular: Major intracranial vascular flow voids are preserved. Skull and upper cervical spine: Small enhancing foci in the clivus are unchanged (series 21, image 38). A 2 cm T1 hypointense lesion in the right parietal skull including a punctate focus of internal enhancement is also unchanged (series 16, image 129 and series 21, image 134). Sinuses/Orbits: Unremarkable orbits. Mild left maxillary sinus mucosal thickening. Small right mastoid effusion. Other: None. IMPRESSION: 1. Patchy acute to subacute left cerebral hemispheric infarcts primarily in the MCA territory. 2. Unchanged 3 mm enhancing focus in the right cerebellum, possibly a metastasis although a subacute infarct is an alternative consideration. 3. Nonvisualization of the punctate enhancing focus in the left cerebellum on the prior MRI. No evidence of new intracranial metastases. 4. Unchanged small enhancing calvarial lesions. Electronically Signed   By: ALogan BoresM.D.   On: 06/17/2020 20:06   UK[place]Carotid Bilateral  Result Date: 06/18/2020 CLINICAL DATA:  Acute cerebral infarcts. EXAM: BILATERAL CAROTID DUPLEX ULTRASOUND TECHNIQUE: GPearline Cablesscale imaging, color Doppler and duplex ultrasound were performed of bilateral carotid and vertebral arteries in the neck. COMPARISON:  None. FINDINGS: Criteria: Quantification of carotid stenosis is based on velocity parameters that correlate the residual internal carotid diameter with NASCET-based stenosis levels, using the diameter of the distal internal carotid lumen as the denominator for stenosis measurement. The following velocity measurements were obtained: RIGHT ICA:  133/51 cm/sec CCA:  905/69cm/sec SYSTOLIC ICA/CCA RATIO:  1.4 ECA:  199 cm/sec LEFT ICA:  114/37 cm/sec CCA:  1794/80cm/sec SYSTOLIC ICA/CCA RATIO:  1.1 ECA:  168 cm/sec RIGHT CAROTID ARTERY: Mild to moderate calcified plaque at the level of the distal bulb and proximal right ICA.  Estimated right ICA stenosis is 50-69%. RIGHT VERTEBRAL ARTERY: Antegrade flow with normal waveform and velocity. LEFT CAROTID ARTERY: Mild to moderate predominately calcified plaque at the level of the left carotid bulb and proximal left ICA. Estimated left ICA stenosis is less than 50% based on velocities.  LEFT VERTEBRAL ARTERY: Antegrade flow with normal waveform and velocity. IMPRESSION: Mild to moderate calcified plaque at the level of bilateral carotid bulbs and proximal internal carotid arteries. Estimated right ICA stenosis is 50-69% and estimated left ICA stenosis is less than 50%. Electronically Signed   By: Aletta Edouard M.D.   On: 06/18/2020 09:25    Assessment:  The patient is a 64 y.o.  gentleman with metastatic adenocarcinoma of the lung admitted with altered mental status secondary to a left cerebral infarct.  Head MRI on 06/17/2020 revealed patchy acute to subacute left cerebral hemispheric infarcts primarily in the MCA territory.  There was an unchanged 3 mm enhancing focus in the right cerebellum, possibly a metastasis vs a subacute infarct.  There was nonvisualization of the punctate enhancing focus in the left cerebellum on the prior MRI.  There were no evidence of new intracranial metastases.  There were unchanged small enhancing calvarial lesions.  PET scan on 05/22/2020 revealed a hypermetabolic spiculated 1.9 x 2.2 cm (SUV 10.9) right upper lobe nodule with hypermetabolic bilateral mediastinal and hilar lymph nodes (5-7 mm; SUV 3.1 - 4.0), bilateral adrenal lesions (right 1.5 x 2.5 cm; SUV 28.6; left SUV 5.6) and lytic osseous lesions (cervical spine, right scapula (2.9 x 3.4 cm; SUV 41.3), left 10th rib, L3, right sacrum, right pubic bone) c/w stage IV primary bronchogenic carcinoma.    Symptomatically, he is agitated and confused.  Plan:   1.  Stage IV lung cancer  Discussed with the patient's stepdaughter current issues and initial plans for treatment.  Currently his  performance status is poor s/p CVA.    We discussed reassessing over the next few weeks to determine if he will have improvement in function/mentation to receive systemic therapy.     Foundation One testing is currently pending.   He is not a candidate for systemic chemotherapy at this time.  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  He does not require Decadron.  He has not started palliative radiation to his sacrum and ilium which were causing him significant pain prior to recent events.   He would need to be able to cooperate to receive radiation and be cleared from his recent COVID-19 infection.  Appreciate palliative care consult. 2.  Code status  DNR/DNI  He does not have a medical power attorney.  Currently is stepdaughter is making medical decisions.   Thank you for allowing me to participate in EZIAH NEGRO 's care.  I will follow him closely with you while hospitalized and after discharge in the outpatient department.   [name], MD  06/18/2020, 5:20 PM

## 2020-06-18 NOTE — Consult Note (Addendum)
Referring Physician: Dr. Leslye Peer    Chief Complaint: Acute left MCA territory stroke  HPI: Douglas [name] is an 64 y.o. 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 CT head in the ED did not reveal a stroke or definite metastatic lesion. Subsequent MRI revealed a large acute left MCA territory ischemic infarction.   MRI brain (9/6): 1. Patchy acute to subacute left cerebral hemispheric infarcts primarily in the MCA territory. 2. Unchanged 3 mm enhancing focus in the right cerebellum, possibly a metastasis although a subacute infarct is an alternative consideration. 3. Nonvisualization of the punctate enhancing focus in the left cerebellum on the prior MRI. No evidence of new intracranial metastases. 4. Unchanged small enhancing calvarial lesions.  He recently had imaging of his cervical and lumbar spine (August 18), revealing bony and soft tissue metastases.   MRI C-spine (8/18):  -Metastatic soft tissue involving the left C5-6 facet joint and right anterolateral C5 vertebral body. There is extension of tumor into the prevertebral and circumferential epidural space at the C5-6 level without significant spinal canal narrowing. -Tumor also extends to involve the left C5-6 and C6-7 transverse and neural foramen with partial encasement of the vertebral artery. -No evidence of spinal cord enhancement.  MRI L-spine (8/18):  - L3 metastatic lesion and pathologic fracture with approximately 20% height loss. There is extension of tumor into the prevertebral and anterior epidural space at the L3 level with mild spinal  canal and bilateral neural foraminal narrowing. - Partially imaged 4.2 cm right lumbosacral junction/SI joint metastasis with extension of tumor into the right L4-5 neural foramen. Query associated pathologic fracture.   LSN: Sunday at approximately 1300 tPA Given: No: Out of time window.   Past Medical History:  Diagnosis Date  . Cancer ([place])    lung with brain mets  . COPD (chronic obstructive pulmonary disease) ([place])   . Hyperlipidemia   . Hypertension   . Lesion of adrenal gland ([place])   . Lung mass     Past Surgical History:  Procedure Laterality Date  . LUNG BIOPSY  August '21    Family History  Problem Relation Age of Onset  . Cancer Neg Hx    Social History:  reports that he has been smoking cigarettes. He started smoking about 45 years ago. He has a 22.50 pack-year smoking history. He has never used smokeless tobacco. He reports current alcohol use.  Drug: Marijuana.  Allergies: No Known Allergies  Home Medications:  No current facility-administered medications on file prior to encounter.   Current Outpatient Medications on File Prior to Encounter  Medication Sig Dispense Refill  . albuterol (VENTOLIN HFA) 108 (90 Base) MCG/ACT inhaler Inhale 1-2 puffs into the lungs every 6 (six) hours as needed for wheezing or shortness of breath. 6.7 g 0  . Budeson-Glycopyrrol-Formoterol (BREZTRI AEROSPHERE) 160-9-4.8 MCG/ACT AERO Inhale 2 puffs into the lungs in the morning and at bedtime. 2 g 0  . dexamethasone (DECADRON) 6 MG tablet Take 1 tablet (6 mg total) by mouth daily for 6 days. 6 tablet 0  . gabapentin (NEURONTIN) 300 MG capsule Take 300 mg by mouth 2 (two) times daily.    Marland Kitchen  losartan-hydrochlorothiazide (HYZAAR) 50-12.5 MG tablet Take 1 tablet by mouth daily.    . nicotine (NICODERM CQ - DOSED IN MG/24 HOURS) 14 mg/24hr patch Place 1 patch (14 mg total) onto the skin daily as needed for up to 14 days (smoking cessation). 14 patch 0  . oxyCODONE (OXY IR/ROXICODONE) 5  MG immediate release tablet Take 1-2 tablets (5-10 mg total) by mouth every 4 (four) hours as needed for severe pain. 60 tablet 0  . oxyCODONE (OXYCONTIN) 10 mg 12 hr tablet Take 1 tablet (10 mg total) by mouth every 8 (eight) hours. 60 tablet 0  . pantoprazole (PROTONIX) 40 MG tablet Take 1 tablet (40 mg total) by mouth daily. 90 tablet 0   Inpatient Medications: . aspirin EC  325 mg Oral Daily  . atorvastatin  40 mg Oral QPM  . dexamethasone (DECADRON) injection  4 mg Intravenous Q6H  . enoxaparin (LOVENOX) injection  40 mg Subcutaneous Q24H  . gabapentin  300 mg Oral BID  . losartan  50 mg Oral Daily   And  . hydrochlorothiazide  12.5 mg Oral Daily  . mometasone-formoterol  2 puff Inhalation BID   And  . umeclidinium bromide  1 puff Inhalation Daily  . oxyCODONE  10 mg Oral Q8H  . pantoprazole  40 mg Oral Daily  . sodium chloride flush  3 mL Intravenous Once  . sodium chloride flush  3 mL Intravenous Q12H  . sucralfate  1 g Oral TID WC & HS  . traZODone  50 mg Oral QHS     ROS: Unable to obtain due to aphasia.   Physical Examination: Blood pressure (!) 150/99, pulse 68, temperature 99.3 F (37.4 C), temperature source Oral, resp. rate 14, height 5\' 5"  (1.651 m), weight 68.1 kg, SpO2 100 %.  HEENT: [place]/AT Lungs: Frequent wet cough noted Ext: No edema  Neurologic Examination: Mental Status:  Awake and alert. Has intermittent spells of decreased responsiveness with eyes partially closed. Speech is for the most part unrelated to questions asked. Will utter a fluent, short phrase in response to being greeted. Perseverated with different days of the week when asked the day, month and year. Unable to state the city or state. Naming impaired. Cannot grab or visually identify objects presented to him, but does confabulate answers to visual stimuli. Will track eyes to voice but will not track silent moving visual stimuli.  Cranial Nerves: II:  Blinks to threat in temporal visual  fields, left hemifield more consistently than right hemifield. PERRL. Cannot visually identify objects presented to him. 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  III,IV, VI: Asymmetric palpebral fissures. Eyes are conjugate. Will gaze to the right and left when examiner speaks while walking, but will not track moving but silent visual stimuli.   V,VII: Smile symmetric, facial temp sensation equal bilaterally VIII: hearing intact to voice IX,X: No hypophonia XI: Head is midline XII: midline tongue extension  Motor: RUE 4/5 RLE 4/5 LUE and LLE 4/5 Decreased RUE tone.  Sensory: FT intact BLE and BUE. Positive for right sided extinction to DSS (RUE and RLE). Temp sensation intact x 4.  Deep Tendon Reflexes:  1+ bilateral brachioradialis and patellae Plantars: Right: Upgoing   Left: downgoing Cerebellar: Unable to target an object with either upper extremity, c/w cortical blindness Gait: Unable to assess  Results for orders placed or performed during the hospital encounter of 06/17/20 (from the past 48 hour(s))  Protime-INR     Status: None   Collection Time:  06/17/20 10:40 AM  Result Value Ref Range   Prothrombin Time 12.9 11.4 - 15.2 seconds   INR 1.0 0.8 - 1.2    Comment: (NOTE) INR goal varies based on device and disease states. Performed at [facility], [address] [place], [place] [address]   APTT     Status: None   Collection Time: 06/17/20 10:40 AM  Result Value Ref Range   aPTT 24 24 - 36 seconds    Comment: Performed at [facility], [address] [place], [place] [address]  CBC     Status: Abnormal   Collection Time: 06/17/20 10:40 AM  Result Value Ref Range   WBC 16.0 (H) 4.0 - 10.5 K/uL   RBC 3.60 (L) 4.22 - 5.81 MIL/uL   Hemoglobin 10.7 (L) 13.0 - 17.0 g/dL   HCT 32.9 (L) 39 - 52 %   MCV 91.4 80.0 - 100.0 fL   MCH 29.7 26.0 - 34.0 pg   MCHC 32.5 30.0 - 36.0 g/dL   RDW 13.7 11.5 - 15.5 %   Platelets 461 (H) 150 - 400 K/uL   nRBC  0.0 0.0 - 0.2 %    Comment: Performed at [facility], [address] [place], [place] [address]  Differential     Status: Abnormal   Collection Time: 06/17/20 10:40 AM  Result Value Ref Range   Neutrophils Relative % 82 %   Neutro Abs 13.3 (H) 1.7 - 7.7 K/uL   Lymphocytes Relative 8 %   Lymphs Abs 1.2 0.7 - 4.0 K/uL   Monocytes Relative 8 %   Monocytes Absolute 1.2 (H) 0 - 1 K/uL   Eosinophils Relative 1 %   Eosinophils Absolute 0.2 0 - 0 K/uL   Basophils Relative 0 %   Basophils Absolute 0.0 0 - 0 K/uL   Immature Granulocytes 1 %   Abs Immature Granulocytes 0.15 (H) 0.00 - 0.07 K/uL    Comment: Performed at [facility], [address], [place], [place] [address]  Comprehensive metabolic panel     Status: Abnormal   Collection Time: 06/17/20 10:40 AM  Result Value Ref Range   Sodium 139 135 - 145 mmol/L   Potassium 3.8 3.5 - 5.1 mmol/L   Chloride 94 (L) 98 - 111 mmol/L   CO2 33 (H) 22 - 32 mmol/L   Glucose, Bld 98 70 - 99 mg/dL    Comment: Glucose reference range applies only to samples taken after fasting for at least 8 hours.   BUN 15 8 - 23 mg/dL   Creatinine, Ser 1.05 0.61 - 1.24 mg/dL   Calcium 8.9 8.9 - 10.3 mg/dL   Total Protein 6.9 6.5 - 8.1 g/dL   Albumin 3.3 (L) 3.5 - 5.0 g/dL   AST 23 15 - 41 U/L   ALT 24 0 - 44 U/L   Alkaline Phosphatase 68 38 - 126 U/L   Total Bilirubin 0.8 0.3 - 1.2 mg/dL   GFR calc non Af Amer >60 >60 mL/min   GFR calc Af Amer >60 >60 mL/min   Anion gap 12 5 - 15    Comment: Performed at [facility], [address] [place], [place] [address]  Magnesium     Status: None   Collection Time: 06/17/20 10:40 AM  Result Value Ref Range   Magnesium 2.3 1.7 - 2.4 mg/dL    Comment: Performed at [facility], [address], [place], [place] [address]  TSH  Status: None   Collection Time: 06/17/20 10:40 AM  Result Value Ref Range   TSH 1.628 0.350 - 4.500 uIU/mL    Comment: Performed by a 3rd  Generation assay with a functional sensitivity of <=0.01 uIU/mL. Performed at [facility], [place], [place] [address]   Troponin I (High Sensitivity)     Status: None   Collection Time: 06/17/20 10:40 AM  Result Value Ref Range   Troponin I (High Sensitivity) 9 <18 ng/L    Comment: (NOTE) Elevated high sensitivity troponin I (hsTnI) values and significant  changes across serial measurements may suggest ACS but many other  chronic and acute conditions are known to elevate hsTnI results.  Refer to the "Links" section for chest pain algorithms and additional  guidance. Performed at [facility], [address] [place], [place] [address]   Procalcitonin - Baseline     Status: None   Collection Time: 06/17/20 10:40 AM  Result Value Ref Range   Procalcitonin <0.10 ng/mL    Comment:        Interpretation: PCT (Procalcitonin) <= 0.5 ng/mL: Systemic infection (sepsis) is not likely. Local bacterial infection is possible. (NOTE)       Sepsis PCT Algorithm           Lower Respiratory Tract                                      Infection PCT Algorithm    ----------------------------     ----------------------------         PCT < 0.25 ng/mL                PCT < 0.10 ng/mL          Strongly encourage             Strongly discourage   discontinuation of antibiotics    initiation of antibiotics    ----------------------------     -----------------------------       PCT 0.25 - 0.50 ng/mL            PCT 0.10 - 0.25 ng/mL               OR       >80% decrease in PCT            Discourage initiation of                                            antibiotics      Encourage discontinuation           of antibiotics    ----------------------------     -----------------------------         PCT >= 0.50 ng/mL              PCT 0.26 - 0.50 ng/mL               AND        <80% decrease in PCT             Encourage initiation of  antibiotics       Encourage continuation           of antibiotics    ----------------------------     -----------------------------        PCT >= 0.50 ng/mL                  PCT > 0.50 ng/mL               AND         increase in PCT                  Strongly encourage                                      initiation of antibiotics    Strongly encourage escalation           of antibiotics                                     -----------------------------                                           PCT <= 0.25 ng/mL                                                 OR                                        > 80% decrease in PCT                                      Discontinue / Do not initiate                                             antibiotics  Performed at [facility], [address] [place], [place] [address]   Urinalysis, Complete w Microscopic     Status: Abnormal   Collection Time: 06/17/20 12:33 PM  Result Value Ref Range   Color, Urine STRAW (A) YELLOW   APPearance CLEAR (A) CLEAR   Specific Gravity, Urine 1.006 1.005 - 1.030   pH 7.0 5.0 - 8.0   Glucose, UA NEGATIVE NEGATIVE mg/dL   Hgb urine dipstick NEGATIVE NEGATIVE   Bilirubin Urine NEGATIVE NEGATIVE   Ketones, ur NEGATIVE NEGATIVE mg/dL   Protein, ur NEGATIVE NEGATIVE mg/dL   Nitrite NEGATIVE NEGATIVE   Leukocytes,Ua NEGATIVE NEGATIVE   RBC / HPF 0-5 0 - 5 RBC/hpf   WBC, UA NONE SEEN 0 - 5 WBC/hpf   Bacteria, UA NONE SEEN NONE SEEN   Squamous Epithelial / LPF 0-5 0 - 5    Comment: Performed at [facility], [address] [place], [place] [address]  Basic metabolic panel  Status: Abnormal   Collection Time: 06/18/20  5:23 AM  Result Value Ref Range   Sodium 140 135 - 145 mmol/L   Potassium 4.4 3.5 - 5.1 mmol/L   Chloride 98 98 - 111 mmol/L   CO2 31 22 - 32 mmol/L   Glucose, Bld 118 (H) 70 - 99 mg/dL    Comment: Glucose reference range applies only to samples taken after fasting for  at least 8 hours.   BUN 15 8 - 23 mg/dL   Creatinine, Ser 0.95 0.61 - 1.24 mg/dL   Calcium 8.8 (L) 8.9 - 10.3 mg/dL   GFR calc non Af Amer >60 >60 mL/min   GFR calc Af Amer >60 >60 mL/min   Anion gap 11 5 - 15    Comment: Performed at [facility], [address] [place], [place] [address]  CBC     Status: Abnormal   Collection Time: 06/18/20  5:23 AM  Result Value Ref Range   WBC 8.9 4.0 - 10.5 K/uL   RBC 3.37 (L) 4.22 - 5.81 MIL/uL   Hemoglobin 10.0 (L) 13.0 - 17.0 g/dL   HCT 30.9 (L) 39 - 52 %   MCV 91.7 80.0 - 100.0 fL   MCH 29.7 26.0 - 34.0 pg   MCHC 32.4 30.0 - 36.0 g/dL   RDW 13.8 11.5 - 15.5 %   Platelets 414 (H) 150 - 400 K/uL   nRBC 0.0 0.0 - 0.2 %    Comment: Performed at [facility], [address] [place], [place] [address]  Lipid panel     Status: None   Collection Time: 06/18/20  5:23 AM  Result Value Ref Range   Cholesterol 124 0 - 200 mg/dL   Triglycerides 126 <150 mg/dL   HDL 58 >40 mg/dL   Total CHOL/HDL Ratio 2.1 RATIO   VLDL 25 0 - 40 mg/dL   LDL Cholesterol 41 0 - 99 mg/dL    Comment:        Total Cholesterol/HDL:CHD Risk Coronary Heart Disease Risk Table                     Men   Women  1/2 Average Risk   3.4   3.3  Average Risk       5.0   4.4  2 X Average Risk   9.6   7.1  3 X Average Risk  23.4   11.0        Use the calculated Patient Ratio above and the CHD Risk Table to determine the patient's CHD Risk.        ATP III CLASSIFICATION (LDL):  <100     mg/dL   Optimal  100-129  mg/dL   Near or Above                    Optimal  130-159  mg/dL   Borderline  160-189  mg/dL   High  >190     mg/dL   Very High Performed at [facility], [address] [place], [place] [address]    DG Chest 1 View  Result Date: 06/17/2020 CLINICAL DATA:  History of metastatic lung cancer. Altered mental status. EXAM: CHEST  1 VIEW COMPARISON:  June 11, 2020. FINDINGS: The heart size and mediastinal contours are within normal  limits. Left lung is clear. Right upper lobe irregular density is again noted consistent with malignancy. No pneumothorax or pleural effusion is noted. The visualized skeletal  structures are unremarkable. IMPRESSION: Stable right upper lobe density consistent with malignancy. No other abnormality seen in the chest. Electronically Signed   By: Marijo Conception M.D.   On: 06/17/2020 13:18   CT HEAD WO CONTRAST  Result Date: 06/17/2020 CLINICAL DATA:  Altered mental status. History of metastatic lung cancer. EXAM: CT HEAD WITHOUT CONTRAST TECHNIQUE: Contiguous axial images were obtained from the base of the skull through the vertex without intravenous contrast. COMPARISON:  May 29, 2020. FINDINGS: Brain: Mild chronic ischemic white matter disease is noted. No mass effect or midline shift is noted. Ventricular size is within normal limits. There is no evidence of mass lesion, hemorrhage or acute infarction. Possible metastatic lesions noted on recent MRI are not visualized on this study. Vascular: No hyperdense vessel or unexpected calcification. Skull: No definite fracture is noted. Multiple small rounded lucencies are noted throughout the skull which may represent benign venous lakes, but metastatic disease cannot be excluded given the history of metastatic lung cancer. Sinuses/Orbits: No acute finding. Other: None. IMPRESSION: 1. Mild chronic ischemic white matter disease. No acute intracranial abnormality seen. 2. Multiple small rounded lucencies are noted throughout the skull which may represent benign venous lakes, but metastatic disease cannot be excluded given the history of metastatic lung cancer. Electronically Signed   By: Marijo Conception M.D.   On: 06/17/2020 11:59   MR BRAIN W WO CONTRAST  Result Date: 06/17/2020 CLINICAL DATA:  Confusion with right arm and leg weakness. History of metastatic lung cancer. EXAM: MRI HEAD WITHOUT AND WITH CONTRAST TECHNIQUE: Multiplanar, multiecho pulse sequences of  the brain and surrounding structures were obtained without and with intravenous contrast. CONTRAST:  70mL GADAVIST GADOBUTROL 1 MMOL/ML IV SOLN COMPARISON:  Head CT 06/17/2020 and MRI 05/29/2020 FINDINGS: Brain: There are patchy acute to subacute infarcts in the left MCA territory, overall moderate in volume with greatest involvement of the lateral frontal and parietal cortex. There is also border zone involvement in the lateral left occipital lobe. Associated small volume petechial blood products are noted in the left frontoparietal and left occipital infarcts, and there is also an unchanged chronic microhemorrhage in the posterior left temporal lobe. Punctate acute infarcts are present in the left basal ganglia. Periventricular white matter T2 hyperintensities are unchanged from the prior MRI and are nonspecific but compatible with mild chronic small vessel ischemic disease. Chronic lacunar infarcts are noted in the thalami and right lentiform nucleus. An unchanged T2 hyperintense focus in the right midbrain may represent a dilated perivascular space or chronic lacunar infarct. There is mild cerebral atrophy. No midline shift or extra-axial fluid collection is evident. A plaque-like 3 mm enhancing focus along the inferior right cerebellar hemisphere is unchanged from the prior MRI (series 21, image 31). The punctate enhancing focus in the medial left cerebellar hemisphere on the prior MRI is not evident today. A 2 mm focus of cortical enhancement in left frontoparietal cortex is secondary to the recent infarct. Vascular: Major intracranial vascular flow voids are preserved. Skull and upper cervical spine: Small enhancing foci in the clivus are unchanged (series 21, image 38). A 2 cm T1 hypointense lesion in the right parietal skull including a punctate focus of internal enhancement is also unchanged (series 16, image 129 and series 21, image 134). Sinuses/Orbits: Unremarkable orbits. Mild left maxillary sinus  mucosal thickening. Small right mastoid effusion. Other: None. IMPRESSION: 1. Patchy acute to subacute left cerebral hemispheric infarcts primarily in the MCA territory. 2. Unchanged 3 mm enhancing  focus in the right cerebellum, possibly a metastasis although a subacute infarct is an alternative consideration. 3. Nonvisualization of the punctate enhancing focus in the left cerebellum on the prior MRI. No evidence of new intracranial metastases. 4. Unchanged small enhancing calvarial lesions. Electronically Signed   By: Logan Bores M.D.   On: 06/17/2020 20:06    Assessment: 64 y.o. male with metastatic lung cancer, presenting with multiple patchy acute to subacute ischemic infarctions involving the left cerebral hemisphere.  1. Exam reveals receptive and expressive aphasia in conjunction with right sided weakness and cortical blindness.  2. MRI brain (9/6): Patchy acute to subacute left cerebral hemispheric infarcts primarily in the MCA territory. Unchanged 3 mm enhancing focus in the right cerebellum, possibly a metastasis although a subacute infarct is an alternative consideration. Nonvisualization of the punctate enhancing focus in the left cerebellum on the prior MRI. No evidence of new intracranial Metastases. Unchanged small enhancing calvarial lesions. 3. Carotid ultrasound: Mild to moderate calcified plaque at the level of bilateral carotid bulbs and proximal internal carotid arteries. Estimated right ICA stenosis is 50-69% and estimated left ICA stenosis is less than 50%.  4. Stroke Risk Factors - Cancer, smoking history,   Recommendations: 1. HgbA1c, fasting lipid panel 2. MRA of the brain without contrast 3. PT consult, OT consult, Speech consult 4. Echocardiogram report is pending 5. BP management. Out of permissive HTN time window. 6. Prophylactic therapy- Agree with starting ASA. Given that his life expectancy is low, benefits of atorvastatin most likely are outweighed by risks;  recommend discontinuing atorvastatin.  7. Telemetry monitoring 8. Frequent neuro checks 9. Consider discontinuing Dexamethasone as the only intracerebral metastasis seen on MRI this admission is a subcentimeter right cerebellar enhancing lesion without associated mass effect. However, if there is a medical/pulmonary indication or an oncology indication for a lesion outside of the brain, then may need to continue.    @Electronically  signed: Dr. Kerney Elbe  06/18/2020, 8:31 AM

## 2020-06-18 NOTE — Consult Note (Signed)
Douglas [name]  [phone] [phone]   Name: Douglas [name] Date: 06/18/2020 MRN: [id number]  DOB: [birth year]  Patient Care Team: Patient, No Pcp Per as PCP - General (General Practice) [name], MD as Referring Physician (Hematology and Oncology) Noreene Filbert, MD as Referring Physician (Radiation Oncology) Lucilla Lame, MD as Consulting Physician (Gastroenterology)    REASON FOR CONSULTATION: Douglas [name] is a 64 y.o. male with multiple medical problems including COPD, tobacco abuse, hypertension, hyperlipidemia, who was hospitalized 05/16/2020-05/17/2020 with dysphagia and unintentional weight loss. Patient was found to have a right lung mass, right adrenal lesion, and diffuse circumferential esophageal thickening on CT. Barium swallow on 05/17/2020 revealed reflux and esophagitis. 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  Patient is now hospitalized 06/17/2020 with altered mental status.  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  He is referred to palliative care to help address goals and manage ongoing symptoms.  SOCIAL HISTORY:     reports that he has been smoking cigarettes. He started smoking about 45 years ago. He has a 22.50 pack-year smoking history. He has never used smokeless tobacco. He reports current alcohol use.  Drug: Marijuana.  Patient is a widower.  He lives with his step-daughter.  Patient also has a step-son who is involved.  Patient previously worked for a Adult nurse.  ADVANCE DIRECTIVES:  Does not have  CODE STATUS: DNR/DNI (MOST form completed on 05/24/2020)  PAST MEDICAL HISTORY: Past Medical History:  Diagnosis Date  . Cancer ([place])    lung with brain mets    . COPD (chronic obstructive pulmonary disease) ([place])   . Hyperlipidemia   . Hypertension   . Lesion of adrenal gland ([place])   . Lung mass     PAST SURGICAL HISTORY:  Past Surgical History:  Procedure Laterality Date  . LUNG BIOPSY  August '21    HEMATOLOGY/ONCOLOGY HISTORY:  Oncology History   No history exists.    ALLERGIES:  has No Known Allergies.  MEDICATIONS:  Current Facility-Administered Medications  Medication Dose Route Frequency Provider Last Rate Last Admin  . 0.9 %  sodium chloride infusion  250 mL Intravenous PRN Agbata, Tochukwu, MD      . albuterol (VENTOLIN HFA) 108 (90 Base) MCG/ACT inhaler 1-2 puff  1-2 puff Inhalation Q6H PRN Agbata, Tochukwu, MD      . aspirin EC tablet 325 mg  325 mg Oral Daily Loletha Grayer, MD   325 mg at 06/18/20 0919  . atorvastatin (LIPITOR) tablet 40 mg  40 mg Oral QPM Wieting, Richard, MD      . dexamethasone (DECADRON) injection 4 mg  4 mg Intravenous Q6H Lucrezia Starch, MD   4 mg at 06/18/20 1331  . enoxaparin (LOVENOX) injection 40 mg  40 mg Subcutaneous Q24H Agbata, Tochukwu, MD   40 mg at 06/17/20 2216  . gabapentin (NEURONTIN) capsule 300 mg  300 mg Oral BID Agbata, Tochukwu, MD   300 mg at 06/18/20 0920  . haloperidol lactate (HALDOL) injection 1 mg  1 mg Intravenous Q6H PRN Agbata, Tochukwu, MD   1 mg at 06/18/20 0919  . losartan (COZAAR) tablet 50 mg  50 mg Oral Daily Benn Moulder, RPH   50 mg at 06/18/20 0919   And  . hydrochlorothiazide (  MICROZIDE) capsule 12.5 mg  12.5 mg Oral Daily Deatra Robinson B, RPH   12.5 mg at 06/18/20 0919  . mometasone-formoterol (DULERA) 200-5 MCG/ACT inhaler 2 puff  2 puff Inhalation BID Agbata, Tochukwu, MD       And  . umeclidinium bromide (INCRUSE ELLIPTA) 62.5 MCG/INH 1 puff  1 puff Inhalation Daily Agbata, Tochukwu, MD   1 puff at 06/18/20 0917  . nicotine (NICODERM CQ - dosed in mg/24 hours) patch 14 mg  14 mg Transdermal Daily PRN Agbata, Tochukwu, MD      . ondansetron  (ZOFRAN) tablet 4 mg  4 mg Oral Q6H PRN Agbata, Tochukwu, MD       Or  . ondansetron (ZOFRAN) injection 4 mg  4 mg Intravenous Q6H PRN Agbata, Tochukwu, MD      . oxyCODONE (Oxy IR/ROXICODONE) immediate release tablet 5-10 mg  5-10 mg Oral Q4H PRN Agbata, Tochukwu, MD   10 mg at 06/18/20 0517  . oxyCODONE (OXYCONTIN) 12 hr tablet 10 mg  10 mg Oral Q8H Agbata, Tochukwu, MD   10 mg at 06/18/20 1331  . pantoprazole (PROTONIX) EC tablet 40 mg  40 mg Oral Daily Agbata, Tochukwu, MD   40 mg at 06/18/20 0919  . sodium chloride flush (NS) 0.9 % injection 3 mL  3 mL Intravenous Once Lucrezia Starch, MD      . sodium chloride flush (NS) 0.9 % injection 3 mL  3 mL Intravenous Q12H Agbata, Tochukwu, MD      . sodium chloride flush (NS) 0.9 % injection 3 mL  3 mL Intravenous PRN Agbata, Tochukwu, MD      . sucralfate (CARAFATE) tablet 1 g  1 g Oral TID WC & HS Agbata, Tochukwu, MD   1 g at 06/18/20 1332  . traZODone (DESYREL) tablet 50 mg  50 mg Oral QHS Loletha Grayer, MD       Current Outpatient Medications  Medication Sig Dispense Refill  . albuterol (VENTOLIN HFA) 108 (90 Base) MCG/ACT inhaler Inhale 1-2 puffs into the lungs every 6 (six) hours as needed for wheezing or shortness of breath. 6.7 g 0  . Budeson-Glycopyrrol-Formoterol (BREZTRI AEROSPHERE) 160-9-4.8 MCG/ACT AERO Inhale 2 puffs into the lungs in the morning and at bedtime. 2 g 0  . dexamethasone (DECADRON) 6 MG tablet Take 1 tablet (6 mg total) by mouth daily for 6 days. 6 tablet 0  . gabapentin (NEURONTIN) 300 MG capsule Take 300 mg by mouth 2 (two) times daily.    Marland Kitchen losartan-hydrochlorothiazide (HYZAAR) 50-12.5 MG tablet Take 1 tablet by mouth daily.    . nicotine (NICODERM CQ - DOSED IN MG/24 HOURS) 14 mg/24hr patch Place 1 patch (14 mg total) onto the skin daily as needed for up to 14 days (smoking cessation). 14 patch 0  . oxyCODONE (OXY IR/ROXICODONE) 5 MG immediate release tablet Take 1-2 tablets (5-10 mg total) by mouth every 4  (four) hours as needed for severe pain. 60 tablet 0  . oxyCODONE (OXYCONTIN) 10 mg 12 hr tablet Take 1 tablet (10 mg total) by mouth every 8 (eight) hours. 60 tablet 0  . pantoprazole (PROTONIX) 40 MG tablet Take 1 tablet (40 mg total) by mouth daily. 90 tablet 0    VITAL SIGNS: BP (!) 150/99   Pulse 68   Temp 99.3 F (37.4 C) (Oral)   Resp 14   Ht $R'5\' 5"'KU$  (1.651 m)   Wt 150 lb 2.1 oz (68.1 kg)   SpO2 100%  BMI 24.98 kg/m  Filed Weights   06/17/20 1035  Weight: 150 lb 2.1 oz (68.1 kg)    Estimated body mass index is 24.98 kg/m as calculated from the following:   Height as of this encounter: $RemoveBeforeD'5\' 5"'uUHwoXulgOmZJP$  (1.651 m).   Weight as of this encounter: 150 lb 2.1 oz (68.1 kg).  LABS: CBC:    Component Value Date/Time   WBC 8.9 06/18/2020 0523   HGB 10.0 (L) 06/18/2020 0523   HCT 30.9 (L) 06/18/2020 0523   PLT 414 (H) 06/18/2020 0523   MCV 91.7 06/18/2020 0523   NEUTROABS 13.3 (H) 06/17/2020 1040   LYMPHSABS 1.2 06/17/2020 1040   MONOABS 1.2 (H) 06/17/2020 1040   EOSABS 0.2 06/17/2020 1040   BASOSABS 0.0 06/17/2020 1040   Comprehensive Metabolic Panel:    Component Value Date/Time   NA 140 06/18/2020 0523   K 4.4 06/18/2020 0523   CL 98 06/18/2020 0523   CO2 31 06/18/2020 0523   BUN 15 06/18/2020 0523   CREATININE 0.95 06/18/2020 0523   GLUCOSE 118 (H) 06/18/2020 0523   CALCIUM 8.8 (L) 06/18/2020 0523   AST 23 06/17/2020 1040   ALT 24 06/17/2020 1040   ALKPHOS 68 06/17/2020 1040   BILITOT 0.8 06/17/2020 1040   PROT 6.9 06/17/2020 1040   ALBUMIN 3.3 (L) 06/17/2020 1040    RADIOGRAPHIC STUDIES: DG Chest 1 View  Result Date: 06/17/2020 CLINICAL DATA:  History of metastatic lung cancer. Altered mental status. EXAM: CHEST  1 VIEW COMPARISON:  June 11, 2020. FINDINGS: The heart size and mediastinal contours are within normal limits. Left lung is clear. Right upper lobe irregular density is again noted consistent with malignancy. No pneumothorax or pleural effusion is noted.  The visualized skeletal structures are unremarkable. IMPRESSION: Stable right upper lobe density consistent with malignancy. No other abnormality seen in the chest. Electronically Signed   By: Marijo Conception M.D.   On: 06/17/2020 13:18   CT HEAD WO CONTRAST  Result Date: 06/17/2020 CLINICAL DATA:  Altered mental status. History of metastatic lung cancer. EXAM: CT HEAD WITHOUT CONTRAST TECHNIQUE: Contiguous axial images were obtained from the base of the skull through the vertex without intravenous contrast. COMPARISON:  May 29, 2020. FINDINGS: Brain: Mild chronic ischemic white matter disease is noted. No mass effect or midline shift is noted. Ventricular size is within normal limits. There is no evidence of mass lesion, hemorrhage or acute infarction. Possible metastatic lesions noted on recent MRI are not visualized on this study. Vascular: No hyperdense vessel or unexpected calcification. Skull: No definite fracture is noted. Multiple small rounded lucencies are noted throughout the skull which may represent benign venous lakes, but metastatic disease cannot be excluded given the history of metastatic lung cancer. Sinuses/Orbits: No acute finding. Other: None. IMPRESSION: 1. Mild chronic ischemic white matter disease. No acute intracranial abnormality seen. 2. Multiple small rounded lucencies are noted throughout the skull which may represent benign venous lakes, but metastatic disease cannot be excluded given the history of metastatic lung cancer. Electronically Signed   By: Marijo Conception M.D.   On: 06/17/2020 11:59   MR BRAIN W WO CONTRAST  Result Date: 06/17/2020 CLINICAL DATA:  Confusion with right arm and leg weakness. History of metastatic lung cancer. EXAM: MRI HEAD WITHOUT AND WITH CONTRAST TECHNIQUE: Multiplanar, multiecho pulse sequences of the brain and surrounding structures were obtained without and with intravenous contrast. CONTRAST:  63mL GADAVIST GADOBUTROL 1 MMOL/ML IV SOLN  COMPARISON:  Head CT  06/17/2020 and MRI 05/29/2020 FINDINGS: Brain: There are patchy acute to subacute infarcts in the left MCA territory, overall moderate in volume with greatest involvement of the lateral frontal and parietal cortex. There is also border zone involvement in the lateral left occipital lobe. Associated small volume petechial blood products are noted in the left frontoparietal and left occipital infarcts, and there is also an unchanged chronic microhemorrhage in the posterior left temporal lobe. Punctate acute infarcts are present in the left basal ganglia. Periventricular white matter T2 hyperintensities are unchanged from the prior MRI and are nonspecific but compatible with mild chronic small vessel ischemic disease. Chronic lacunar infarcts are noted in the thalami and right lentiform nucleus. An unchanged T2 hyperintense focus in the right midbrain may represent a dilated perivascular space or chronic lacunar infarct. There is mild cerebral atrophy. No midline shift or extra-axial fluid collection is evident. A plaque-like 3 mm enhancing focus along the inferior right cerebellar hemisphere is unchanged from the prior MRI (series 21, image 31). The punctate enhancing focus in the medial left cerebellar hemisphere on the prior MRI is not evident today. A 2 mm focus of cortical enhancement in left frontoparietal cortex is secondary to the recent infarct. Vascular: Major intracranial vascular flow voids are preserved. Skull and upper cervical spine: Small enhancing foci in the clivus are unchanged (series 21, image 38). A 2 cm T1 hypointense lesion in the right parietal skull including a punctate focus of internal enhancement is also unchanged (series 16, image 129 and series 21, image 134). Sinuses/Orbits: Unremarkable orbits. Mild left maxillary sinus mucosal thickening. Small right mastoid effusion. Other: None. IMPRESSION: 1. Patchy acute to subacute left cerebral hemispheric infarcts primarily  in the MCA territory. 2. Unchanged 3 mm enhancing focus in the right cerebellum, possibly a metastasis although a subacute infarct is an alternative consideration. 3. Nonvisualization of the punctate enhancing focus in the left cerebellum on the prior MRI. No evidence of new intracranial metastases. 4. Unchanged small enhancing calvarial lesions. Electronically Signed   By: Logan Bores M.D.   On: 06/17/2020 20:06   MR BRAIN W WO CONTRAST  Result Date: 05/29/2020 CLINICAL DATA:  Neoplasm, staging. EXAM: MRI HEAD WITHOUT AND WITH CONTRAST MRI CERVICAL SPINE WITHOUT AND WITH CONTRAST MRI LUMBAR SPINE WITHOUT AND WITH CONTRAST TECHNIQUE: Multiplanar, multiecho pulse sequences of the brain and surrounding structures, and cervical spine, to include the craniocervical junction and cervicothoracic junction, were obtained without and with intravenous contrast. Multiplanar and multiecho pulse sequences of the lumbar spine were obtained without and with intravenous contrast. CONTRAST:  54mL GADAVIST GADOBUTROL 1 MMOL/ML IV SOLN COMPARISON:  05/02/2020 lumbar spine radiographs. 05/22/2020 nuclear medicine PET scan. FINDINGS: MRI HEAD FINDINGS Brain: Scattered T2 hyperintense periventricular and subcortical white matter foci are nonspecific however commonly associated with chronic microvascular ischemic changes. Mild cerebral atrophy with ex vacuo dilatation. No acute infarct or intracranial hemorrhage. Tiny left temporal remote microhemorrhage. Sequela of remote left thalamic and right midbrain insults. No midline shift, ventriculomegaly or extra-axial fluid collection. Normal appearance of the sella turcica and pituitary gland. -3 mm inferior right cerebellar enhancing focus (18:14). -punctate medial left cerebellar enhancement (18:34). Vascular: Major intracranial flow voids are preserved. Skull and upper cervical spine: 2 mm right parietal enhancing focus (18:120). Scattered enhancing foci measuring up to 4 mm within the  basilar and anterolateral occipital bone (18:24). Sinuses/Orbits: Normal orbits. Minimal ethmoid and left maxillary sinus mucosal thickening. Trace right mastoid effusion. Other: 6 mm nonenhancing diffusion restricting right occipital  scalp nodule (5:16), likely an epidermal inclusion cyst. MRI CERVICAL SPINE FINDINGS Alignment: Normal. Vertebrae: Diffusely T1 hypointense appearance of the bone marrow. Vertebral body heights are preserved. Bone marrow edema involving the left C4-5 and C5-6 facet joints. Cord: Normal signal intensity morphology.  No abnormal enhancement. Posterior Fossa, vertebral arteries: Please see MRI brain. Disc levels: Multilevel desiccation.  Mild C5-6 disc space loss. C2-3: No significant disc bulge, spinal canal or neural foraminal narrowing. C3-4: Small central protrusion with uncovertebral and facet hypertrophy. Patent spinal canal and left neural foramen. Mild right neural foraminal narrowing. C4-5: There is ill-defined enhancing soft tissue encasing the left C4-5 facet joint measuring 2.0 x 1.6 cm (12:16). The soft tissue extends into the left neural and transverse foramen, encasing the left vertebral artery (12:15). Left predominant and dorsal C5 vertebral body erosions. Prevertebral soft tissue thickening and enhancement is concerning for extension of tumor. There is also ill-defined enhancement along the right anterolateral C5 vertebral body (12:15). Inflammatory changes are seen within the left paraspinal region (11:13). Circumferential soft tissue thickening and enhancement involving the epidural space at the C4-5 level. Small disc osteophyte complex with uncovertebral and right facet hypertrophy. Mild spinal canal narrowing. Patent right neural foramen. Moderate left neural foraminal narrowing secondary to soft tissue encroachment. C5-6: There is inferior extension of ill-defined soft tissue along the left transverse/neural foramen and anterior left facet joint (12:12) left facet  joint bone marrow edema. Prominence of mild enhancement involving the epidural space. Disc osteophyte complex with uncovertebral and facet hypertrophy. Mild spinal canal and right neural foraminal narrowing. Soft tissue encroachment narrows the left neural foramen. C6-7: Disc osteophyte complex with uncovertebral and facet hypertrophy. Patent spinal canal and bilateral neural foramina. C7-T1: Disc osteophyte complex with uncovertebral and facet hypertrophy. Patent spinal canal and neural foramen. Paraspinal tissues: As detailed above. MRI LUMBAR SPINE FINDINGS Segmentation:  Standard. Alignment:  Normal. Vertebrae: Diffuse bone marrow heterogeneity. Multilevel osteophytosis. Metastases are detailed below. Conus medullaris and cauda equina: Conus extends to the L2 level. Conus and cauda equina appear normal. Filar lipoma. Disc levels: Multilevel desiccation and disc space loss most prominent at the L5-S1 level. L1-2: No significant disc bulge, spinal canal or neural foraminal narrowing. L2-3: Disc bulge, ligamentum flavum and bilateral facet hypertrophy. Mild spinal canal and bilateral neural foraminal narrowing. L3-4: L3 superior endplate pathologic fracture deformity with approximately 10-20% height loss. 4.7 x 3.4 cm enhancing metastatic lesion involving the right L3 vertebral body and posterior elements (13:19). There is extension of enhancing tumor into the prevertebral and anterior epidural spaces at the L3 level (13:20, 8:19) effacing the lateral recesses and abutting the exiting bilateral L3 nerve roots. Ligamentum flavum and bilateral facet hypertrophy. Mild spinal canal and bilateral neural foraminal narrowing. L4-5: Disc bulge abutting the exiting left L4 nerve root, ligamentum flavum and bilateral facet hypertrophy. Mild spinal canal and moderate bilateral neural foraminal narrowing. Abnormal right L4-5 neural foramen soft tissue is discussed below. L5-S1: Disc bulge with superimposed central protrusion  abutting the descending bilateral S1 nerve roots, ligamentum flavum and bilateral facet hypertrophy. Patent spinal canal. Mild bilateral neural foraminal narrowing. Paraspinal and other soft tissues: At the level of the lumbosacral junction and extending along the right sacroiliac joint there is enhancing soft tissue measuring 4.2 x 2.6 cm (13:34). Soft tissue extends into the right L4-5 neural foramen (8:31) there are adjacent erosions involving the right lateral L5 vertebral body and superior right sacral ala. Anterior cortical defect is concerning for an associated pathologic fracture (  8:36). IMPRESSION: MRI brain: -Sub-5 mm bilateral cerebellar enhancing foci are concerning for metastases. -Sub-5 mm right parietal bone and basilar occipital enhancing foci, concerning for osseous metastases. -Sequela of remote left thalamic and right midbrain insults. Mild cerebral atrophy and chronic microvascular ischemic changes. MRI cervical spine: -Metastatic soft tissue involving the left C5-6 facet joint and right anterolateral C5 vertebral body. There is extension of tumor into the prevertebral and circumferential epidural space at the C5-6 level without significant spinal canal narrowing. -Tumor also extends to involve the left C5-6 and C6-7 transverse and neural foramen with partial encasement of the vertebral artery. -No evidence of spinal cord enhancement. MRI lumbar spine: - L3 metastatic lesion and pathologic fracture with approximately 20% height loss. There is extension of tumor into the prevertebral and anterior epidural space at the L3 level with mild spinal canal and bilateral neural foraminal narrowing. - Partially imaged 4.2 cm right lumbosacral junction/SI joint metastasis with extension of tumor into the right L4-5 neural foramen. Query associated pathologic fracture. - Please see concurrent MR sacrum for better characterization. These results will be called to the ordering clinician or representative by  the Radiologist Assistant, and communication documented in the PACS or [employer]. Electronically Signed   By: Primitivo Gauze M.D.   On: 05/29/2020 14:49   MR Cervical Spine W Wo Contrast  Result Date: 05/29/2020 CLINICAL DATA:  Neoplasm, staging. EXAM: MRI HEAD WITHOUT AND WITH CONTRAST MRI CERVICAL SPINE WITHOUT AND WITH CONTRAST MRI LUMBAR SPINE WITHOUT AND WITH CONTRAST TECHNIQUE: Multiplanar, multiecho pulse sequences of the brain and surrounding structures, and cervical spine, to include the craniocervical junction and cervicothoracic junction, were obtained without and with intravenous contrast. Multiplanar and multiecho pulse sequences of the lumbar spine were obtained without and with intravenous contrast. CONTRAST:  15mL GADAVIST GADOBUTROL 1 MMOL/ML IV SOLN COMPARISON:  05/02/2020 lumbar spine radiographs. 05/22/2020 nuclear medicine PET scan. FINDINGS: MRI HEAD FINDINGS Brain: Scattered T2 hyperintense periventricular and subcortical white matter foci are nonspecific however commonly associated with chronic microvascular ischemic changes. Mild cerebral atrophy with ex vacuo dilatation. No acute infarct or intracranial hemorrhage. Tiny left temporal remote microhemorrhage. Sequela of remote left thalamic and right midbrain insults. No midline shift, ventriculomegaly or extra-axial fluid collection. Normal appearance of the sella turcica and pituitary gland. -3 mm inferior right cerebellar enhancing focus (18:14). -punctate medial left cerebellar enhancement (18:34). Vascular: Major intracranial flow voids are preserved. Skull and upper cervical spine: 2 mm right parietal enhancing focus (18:120). Scattered enhancing foci measuring up to 4 mm within the basilar and anterolateral occipital bone (18:24). Sinuses/Orbits: Normal orbits. Minimal ethmoid and left maxillary sinus mucosal thickening. Trace right mastoid effusion. Other: 6 mm nonenhancing diffusion restricting right occipital scalp  nodule (5:16), likely an epidermal inclusion cyst. MRI CERVICAL SPINE FINDINGS Alignment: Normal. Vertebrae: Diffusely T1 hypointense appearance of the bone marrow. Vertebral body heights are preserved. Bone marrow edema involving the left C4-5 and C5-6 facet joints. Cord: Normal signal intensity morphology.  No abnormal enhancement. Posterior Fossa, vertebral arteries: Please see MRI brain. Disc levels: Multilevel desiccation.  Mild C5-6 disc space loss. C2-3: No significant disc bulge, spinal canal or neural foraminal narrowing. C3-4: Small central protrusion with uncovertebral and facet hypertrophy. Patent spinal canal and left neural foramen. Mild right neural foraminal narrowing. C4-5: There is ill-defined enhancing soft tissue encasing the left C4-5 facet joint measuring 2.0 x 1.6 cm (12:16). The soft tissue extends into the left neural and transverse foramen, encasing the left vertebral artery (  12:15). Left predominant and dorsal C5 vertebral body erosions. Prevertebral soft tissue thickening and enhancement is concerning for extension of tumor. There is also ill-defined enhancement along the right anterolateral C5 vertebral body (12:15). Inflammatory changes are seen within the left paraspinal region (11:13). Circumferential soft tissue thickening and enhancement involving the epidural space at the C4-5 level. Small disc osteophyte complex with uncovertebral and right facet hypertrophy. Mild spinal canal narrowing. Patent right neural foramen. Moderate left neural foraminal narrowing secondary to soft tissue encroachment. C5-6: There is inferior extension of ill-defined soft tissue along the left transverse/neural foramen and anterior left facet joint (12:12) left facet joint bone marrow edema. Prominence of mild enhancement involving the epidural space. Disc osteophyte complex with uncovertebral and facet hypertrophy. Mild spinal canal and right neural foraminal narrowing. Soft tissue encroachment narrows  the left neural foramen. C6-7: Disc osteophyte complex with uncovertebral and facet hypertrophy. Patent spinal canal and bilateral neural foramina. C7-T1: Disc osteophyte complex with uncovertebral and facet hypertrophy. Patent spinal canal and neural foramen. Paraspinal tissues: As detailed above. MRI LUMBAR SPINE FINDINGS Segmentation:  Standard. Alignment:  Normal. Vertebrae: Diffuse bone marrow heterogeneity. Multilevel osteophytosis. Metastases are detailed below. Conus medullaris and cauda equina: Conus extends to the L2 level. Conus and cauda equina appear normal. Filar lipoma. Disc levels: Multilevel desiccation and disc space loss most prominent at the L5-S1 level. L1-2: No significant disc bulge, spinal canal or neural foraminal narrowing. L2-3: Disc bulge, ligamentum flavum and bilateral facet hypertrophy. Mild spinal canal and bilateral neural foraminal narrowing. L3-4: L3 superior endplate pathologic fracture deformity with approximately 10-20% height loss. 4.7 x 3.4 cm enhancing metastatic lesion involving the right L3 vertebral body and posterior elements (13:19). There is extension of enhancing tumor into the prevertebral and anterior epidural spaces at the L3 level (13:20, 8:19) effacing the lateral recesses and abutting the exiting bilateral L3 nerve roots. Ligamentum flavum and bilateral facet hypertrophy. Mild spinal canal and bilateral neural foraminal narrowing. L4-5: Disc bulge abutting the exiting left L4 nerve root, ligamentum flavum and bilateral facet hypertrophy. Mild spinal canal and moderate bilateral neural foraminal narrowing. Abnormal right L4-5 neural foramen soft tissue is discussed below. L5-S1: Disc bulge with superimposed central protrusion abutting the descending bilateral S1 nerve roots, ligamentum flavum and bilateral facet hypertrophy. Patent spinal canal. Mild bilateral neural foraminal narrowing. Paraspinal and other soft tissues: At the level of the lumbosacral junction  and extending along the right sacroiliac joint there is enhancing soft tissue measuring 4.2 x 2.6 cm (13:34). Soft tissue extends into the right L4-5 neural foramen (8:31) there are adjacent erosions involving the right lateral L5 vertebral body and superior right sacral ala. Anterior cortical defect is concerning for an associated pathologic fracture (8:36). IMPRESSION: MRI brain: -Sub-5 mm bilateral cerebellar enhancing foci are concerning for metastases. -Sub-5 mm right parietal bone and basilar occipital enhancing foci, concerning for osseous metastases. -Sequela of remote left thalamic and right midbrain insults. Mild cerebral atrophy and chronic microvascular ischemic changes. MRI cervical spine: -Metastatic soft tissue involving the left C5-6 facet joint and right anterolateral C5 vertebral body. There is extension of tumor into the prevertebral and circumferential epidural space at the C5-6 level without significant spinal canal narrowing. -Tumor also extends to involve the left C5-6 and C6-7 transverse and neural foramen with partial encasement of the vertebral artery. -No evidence of spinal cord enhancement. MRI lumbar spine: - L3 metastatic lesion and pathologic fracture with approximately 20% height loss. There is extension of tumor into the  prevertebral and anterior epidural space at the L3 level with mild spinal canal and bilateral neural foraminal narrowing. - Partially imaged 4.2 cm right lumbosacral junction/SI joint metastasis with extension of tumor into the right L4-5 neural foramen. Query associated pathologic fracture. - Please see concurrent MR sacrum for better characterization. These results will be called to the ordering clinician or representative by the Radiologist Assistant, and communication documented in the PACS or [employer]. Electronically Signed   By: Primitivo Gauze M.D.   On: 05/29/2020 14:49   MR Lumbar Spine W Wo Contrast  Result Date: 05/29/2020 CLINICAL DATA:   Neoplasm, staging. EXAM: MRI HEAD WITHOUT AND WITH CONTRAST MRI CERVICAL SPINE WITHOUT AND WITH CONTRAST MRI LUMBAR SPINE WITHOUT AND WITH CONTRAST TECHNIQUE: Multiplanar, multiecho pulse sequences of the brain and surrounding structures, and cervical spine, to include the craniocervical junction and cervicothoracic junction, were obtained without and with intravenous contrast. Multiplanar and multiecho pulse sequences of the lumbar spine were obtained without and with intravenous contrast. CONTRAST:  56mL GADAVIST GADOBUTROL 1 MMOL/ML IV SOLN COMPARISON:  05/02/2020 lumbar spine radiographs. 05/22/2020 nuclear medicine PET scan. FINDINGS: MRI HEAD FINDINGS Brain: Scattered T2 hyperintense periventricular and subcortical white matter foci are nonspecific however commonly associated with chronic microvascular ischemic changes. Mild cerebral atrophy with ex vacuo dilatation. No acute infarct or intracranial hemorrhage. Tiny left temporal remote microhemorrhage. Sequela of remote left thalamic and right midbrain insults. No midline shift, ventriculomegaly or extra-axial fluid collection. Normal appearance of the sella turcica and pituitary gland. -3 mm inferior right cerebellar enhancing focus (18:14). -punctate medial left cerebellar enhancement (18:34). Vascular: Major intracranial flow voids are preserved. Skull and upper cervical spine: 2 mm right parietal enhancing focus (18:120). Scattered enhancing foci measuring up to 4 mm within the basilar and anterolateral occipital bone (18:24). Sinuses/Orbits: Normal orbits. Minimal ethmoid and left maxillary sinus mucosal thickening. Trace right mastoid effusion. Other: 6 mm nonenhancing diffusion restricting right occipital scalp nodule (5:16), likely an epidermal inclusion cyst. MRI CERVICAL SPINE FINDINGS Alignment: Normal. Vertebrae: Diffusely T1 hypointense appearance of the bone marrow. Vertebral body heights are preserved. Bone marrow edema involving the left C4-5  and C5-6 facet joints. Cord: Normal signal intensity morphology.  No abnormal enhancement. Posterior Fossa, vertebral arteries: Please see MRI brain. Disc levels: Multilevel desiccation.  Mild C5-6 disc space loss. C2-3: No significant disc bulge, spinal canal or neural foraminal narrowing. C3-4: Small central protrusion with uncovertebral and facet hypertrophy. Patent spinal canal and left neural foramen. Mild right neural foraminal narrowing. C4-5: There is ill-defined enhancing soft tissue encasing the left C4-5 facet joint measuring 2.0 x 1.6 cm (12:16). The soft tissue extends into the left neural and transverse foramen, encasing the left vertebral artery (12:15). Left predominant and dorsal C5 vertebral body erosions. Prevertebral soft tissue thickening and enhancement is concerning for extension of tumor. There is also ill-defined enhancement along the right anterolateral C5 vertebral body (12:15). Inflammatory changes are seen within the left paraspinal region (11:13). Circumferential soft tissue thickening and enhancement involving the epidural space at the C4-5 level. Small disc osteophyte complex with uncovertebral and right facet hypertrophy. Mild spinal canal narrowing. Patent right neural foramen. Moderate left neural foraminal narrowing secondary to soft tissue encroachment. C5-6: There is inferior extension of ill-defined soft tissue along the left transverse/neural foramen and anterior left facet joint (12:12) left facet joint bone marrow edema. Prominence of mild enhancement involving the epidural space. Disc osteophyte complex with uncovertebral and facet hypertrophy. Mild spinal canal and right  neural foraminal narrowing. Soft tissue encroachment narrows the left neural foramen. C6-7: Disc osteophyte complex with uncovertebral and facet hypertrophy. Patent spinal canal and bilateral neural foramina. C7-T1: Disc osteophyte complex with uncovertebral and facet hypertrophy. Patent spinal canal and  neural foramen. Paraspinal tissues: As detailed above. MRI LUMBAR SPINE FINDINGS Segmentation:  Standard. Alignment:  Normal. Vertebrae: Diffuse bone marrow heterogeneity. Multilevel osteophytosis. Metastases are detailed below. Conus medullaris and cauda equina: Conus extends to the L2 level. Conus and cauda equina appear normal. Filar lipoma. Disc levels: Multilevel desiccation and disc space loss most prominent at the L5-S1 level. L1-2: No significant disc bulge, spinal canal or neural foraminal narrowing. L2-3: Disc bulge, ligamentum flavum and bilateral facet hypertrophy. Mild spinal canal and bilateral neural foraminal narrowing. L3-4: L3 superior endplate pathologic fracture deformity with approximately 10-20% height loss. 4.7 x 3.4 cm enhancing metastatic lesion involving the right L3 vertebral body and posterior elements (13:19). There is extension of enhancing tumor into the prevertebral and anterior epidural spaces at the L3 level (13:20, 8:19) effacing the lateral recesses and abutting the exiting bilateral L3 nerve roots. Ligamentum flavum and bilateral facet hypertrophy. Mild spinal canal and bilateral neural foraminal narrowing. L4-5: Disc bulge abutting the exiting left L4 nerve root, ligamentum flavum and bilateral facet hypertrophy. Mild spinal canal and moderate bilateral neural foraminal narrowing. Abnormal right L4-5 neural foramen soft tissue is discussed below. L5-S1: Disc bulge with superimposed central protrusion abutting the descending bilateral S1 nerve roots, ligamentum flavum and bilateral facet hypertrophy. Patent spinal canal. Mild bilateral neural foraminal narrowing. Paraspinal and other soft tissues: At the level of the lumbosacral junction and extending along the right sacroiliac joint there is enhancing soft tissue measuring 4.2 x 2.6 cm (13:34). Soft tissue extends into the right L4-5 neural foramen (8:31) there are adjacent erosions involving the right lateral L5 vertebral body  and superior right sacral ala. Anterior cortical defect is concerning for an associated pathologic fracture (8:36). IMPRESSION: MRI brain: -Sub-5 mm bilateral cerebellar enhancing foci are concerning for metastases. -Sub-5 mm right parietal bone and basilar occipital enhancing foci, concerning for osseous metastases. -Sequela of remote left thalamic and right midbrain insults. Mild cerebral atrophy and chronic microvascular ischemic changes. MRI cervical spine: -Metastatic soft tissue involving the left C5-6 facet joint and right anterolateral C5 vertebral body. There is extension of tumor into the prevertebral and circumferential epidural space at the C5-6 level without significant spinal canal narrowing. -Tumor also extends to involve the left C5-6 and C6-7 transverse and neural foramen with partial encasement of the vertebral artery. -No evidence of spinal cord enhancement. MRI lumbar spine: - L3 metastatic lesion and pathologic fracture with approximately 20% height loss. There is extension of tumor into the prevertebral and anterior epidural space at the L3 level with mild spinal canal and bilateral neural foraminal narrowing. - Partially imaged 4.2 cm right lumbosacral junction/SI joint metastasis with extension of tumor into the right L4-5 neural foramen. Query associated pathologic fracture. - Please see concurrent MR sacrum for better characterization. These results will be called to the ordering clinician or representative by the Radiologist Assistant, and communication documented in the PACS or [employer]. Electronically Signed   By: Stana Bunting M.D.   On: 05/29/2020 14:49   MR SACRUM SI JOINTS W WO CONTRAST  Result Date: 05/30/2020 CLINICAL DATA:  Patient with a history of metastatic lung carcinoma. Onset low back pain approximately 1 month ago. Hypermetabolic lesion in the right sacrum on PET scan 05/22/2020. EXAM: MRI  SACRUM WITHOUT AND WITH CONTRAST TECHNIQUE: Multiplanar,  multisequence MR imaging of the sacrum was performed both before and after administration of intravenous contrast. CONTRAST:  6 mL GADAVIST IV SOLN COMPARISON:  PET scan 05/22/2020. FINDINGS: Bones/Joint/Cartilage As seen on the prior PET scan, there is a destructive, enhancing mass lesion in the right sacrum measuring approximately 4.4 cm transverse by 2.5 cm AP by 4.7 cm craniocaudal. The right sacral component of the lesion extends into the inferior aspect of the right L5-S1 foramen and impinges on the right L5 root within and beyond the foramen. Abnormal signal and enhancement in the right ilium consistent with tumor immediately posterior to the soft tissue mass measures 3.1 cm transverse by 1.0 cm AP by 4.8 cm craniocaudal. Tumor does not appear to impact the right S1 root. Ligaments Intact. Muscles and Tendons Intact and normal in appearance. Soft tissues Imaged intrapelvic contents are unremarkable. IMPRESSION: Metastatic disease in the right sacrum and posterior right ilium results in impingement on the right L5 root in the periphery of the foramen and beyond the foramen. Electronically Signed   By: Inge Rise M.D.   On: 05/30/2020 09:27   US Carotid Bilateral  Result Date: 06/18/2020 CLINICAL DATA:  Acute cerebral infarcts. EXAM: BILATERAL CAROTID DUPLEX ULTRASOUND TECHNIQUE: Pearline Cables scale imaging, color Doppler and duplex ultrasound were performed of bilateral carotid and vertebral arteries in the neck. COMPARISON:  None. FINDINGS: Criteria: Quantification of carotid stenosis is based on velocity parameters that correlate the residual internal carotid diameter with NASCET-based stenosis levels, using the diameter of the distal internal carotid lumen as the denominator for stenosis measurement. The following velocity measurements were obtained: RIGHT ICA:  133/51 cm/sec CCA:  38/10 cm/sec SYSTOLIC ICA/CCA RATIO:  1.4 ECA:  199 cm/sec LEFT ICA:  114/37 cm/sec CCA:  175/10 cm/sec SYSTOLIC ICA/CCA RATIO:   1.1 ECA:  168 cm/sec RIGHT CAROTID ARTERY: Mild to moderate calcified plaque at the level of the distal bulb and proximal right ICA. Estimated right ICA stenosis is 50-69%. RIGHT VERTEBRAL ARTERY: Antegrade flow with normal waveform and velocity. LEFT CAROTID ARTERY: Mild to moderate predominately calcified plaque at the level of the left carotid bulb and proximal left ICA. Estimated left ICA stenosis is less than 50% based on velocities. LEFT VERTEBRAL ARTERY: Antegrade flow with normal waveform and velocity. IMPRESSION: Mild to moderate calcified plaque at the level of bilateral carotid bulbs and proximal internal carotid arteries. Estimated right ICA stenosis is 50-69% and estimated left ICA stenosis is less than 50%. Electronically Signed   By: Aletta Edouard M.D.   On: 06/18/2020 09:25   NM PET Image Initial (PI) Skull Base To Thigh  Result Date: 05/22/2020 CLINICAL DATA:  Initial treatment strategy for lung nodule, adrenal mass. EXAM: NUCLEAR MEDICINE PET SKULL BASE TO THIGH TECHNIQUE: 7.8 mCi F-18 FDG was injected intravenously. Full-ring PET imaging was performed from the skull base to thigh after the radiotracer. CT data was obtained and used for attenuation correction and anatomic localization. Fasting blood glucose: 94 mg/dl COMPARISON:  CT chest 05/16/2020. FINDINGS: Mediastinal blood pool activity: SUV max 2.0 Liver activity: SUV max NA NECK: There is focal hypermetabolism in the sellar region, SUV max 8.4, without a definite CT correlate. No hypermetabolic lymph nodes. Incidental CT findings: None. CHEST: Spiculated nodule in the apical segment right upper lobe measures 1.9 x 2.2 cm with an SUV max of 10.9. There is abnormal hypermetabolism associated with apical segmental bronchial wall thickening and mucoid impaction (3/79, 84 and 87),  SUV max 3.8. Hypermetabolic right hilar lymph node measures 7 mm (3/95) with an SUV max of 4.0. Right paratracheal lymph node measures 5 mm (3/81) with an SUV  max of 3.1. AP window lymph node measures 6 mm (3/81) with an SUV max 4.9. Focal left hilar metabolism has an SUV max of 3.3 and corresponds to a 5 mm lymph node, better seen on contrast infused CT chest 05/16/2020. New peribronchovascular ground-glass in the superior segment left lower lobe (3/97) has an SUV max 2.8. Mild mid esophageal long segment hypermetabolism, without a definite CT correlate. Incidental CT findings: Atherosclerotic calcification of the aorta and coronary arteries. Heart size normal. No pericardial or pleural effusion. Centrilobular and paraseptal emphysema. ABDOMEN/PELVIS: Mildly hyperattenuating right adrenal mass measures 1.5 x 2.5 cm with an SUV max 28.6. There is focal hypermetabolism in the left adrenal gland, SUV max 5.6, without a definite CT correlate. No abnormal hypermetabolism in the liver, spleen, pancreas or lymph nodes. Incidental CT findings: Liver, gallbladder, kidneys, spleen, pancreas, stomach and bowel are otherwise unremarkable. Atherosclerotic calcification of the aorta without aneurysm. Slight bladder wall thickening. Prostate is normal in size. SKELETON: There are multiple hypermetabolic lytic lesions in the cervical spine, right scapula, posterior left tenth rib, L3, right sacrum and right pubic bone. Index lesion in the right sacral ala measures 2.9 x 3.4 cm (3/194) with an SUV max of 41.3. Incidental CT findings: None. IMPRESSION: 1. 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 2. Focal hypermetabolism in the sellar region, without a definite CT correlate. Consider MR brain without and with contrast in further evaluation, as clinically indicated. 3. New peribronchovascular ground-glass in the superior segment left lower lobe, likely infectious or inflammatory in etiology. 4.  Aortic atherosclerosis (ICD10-I70.0). Coronary artery calcification. 5.  Emphysema (ICD10-J43.9). Electronically Signed   By: Lorin Picket M.D.   On: 05/22/2020 11:11   CT Biopsy  Result Date: 05/27/2020 INDICATION: 64 year old with a suspicious right lung nodule and suspect metastatic lung disease. Patient has a lucent bone lesion in the right sacral ala. Tissue diagnosis is needed. EXAM: CT-GUIDED CORE BIOPSY OF RIGHT SACRAL BONE LESION Physician: Stephan Minister. Anselm Pancoast, MD MEDICATIONS: None. ANESTHESIA/SEDATION: Fentanyl 50 mcg IV; Versed 1.0 mg IV Moderate Sedation Time:  19 minutes The patient was continuously monitored during the procedure by the interventional radiology nurse under my direct supervision. COMPLICATIONS: None immediate. PROCEDURE: The procedure was explained to the patient. The risks and benefits of the procedure were discussed and the patient's questions were addressed. Informed consent was obtained from the patient. Time-out was performed. Patient was placed supine on CT scanner. CT images through the lower abdomen were obtained. Right side of the abdomen was prepped with chlorhexidine and sterile field was created. Skin was anesthetized with 1% lidocaine. Small skin incision was made. Using CT guidance, a 17 gauge coaxial needle was directed into the right sacral lesion. Needle was positioned along the lateral aspect of the lesion in order to avoid the nerve. Multiple core biopsies were obtained with an 18 gauge core device. Specimens placed in formalin. 17 gauge needle was removed without complication. Bandage placed over the puncture site. FINDINGS: Destructive lucent lesion involving the right sacral ala. Biopsy needle was confirmed within the lesion. Core specimens obtained. IMPRESSION: CT-guided core biopsies of the right sacral bone lesion. Electronically Signed   By: Scherrie Gerlach.D.  On: 05/27/2020 12:56   DG CHEST PORT 1 VIEW  Result Date: 06/11/2020 CLINICAL DATA:  COVID. EXAM:  PORTABLE CHEST 1 VIEW COMPARISON:  06/08/2020.  PET-CT 05/22/2020. FINDINGS: Mediastinum and hilar structures are stable. Known spiculated nodule in the right upper best identified on prior PET-CT. No acute infiltrates. No pleural effusion or pneumothorax. Known bony lesions best identified by prior PET-CT. Carotid vascular calcification. IMPRESSION: 1. Known spiculated right upper lung nodule and multiple bony lesions best identified by prior PET-CT. 2.  No acute pulmonary disease.  Chest is stable from prior exam. 3.  Carotid vascular disease. Electronically Signed   By: Marcello Moores  Register   On: 06/11/2020 06:11   DG Chest Port 1 View  Result Date: 06/08/2020 CLINICAL DATA:  Dyspnea. Recent diagnosis of lung cancer. Family positive for COVID-19. EXAM: PORTABLE CHEST 1 VIEW COMPARISON:  Radiograph and CT 05/16/2020.  PET CT 05/22/2020. FINDINGS: The lungs are hyperinflated. Stable heart size and mediastinal contours. No convincing acute airspace disease. The known spiculated nodule at the right lung apex is only faintly visualized. No pneumothorax or large pleural effusion. Known osseous metastasis on PET not well seen by radiograph. IMPRESSION: 1. No acute findings. 2. Chronic hyperinflation. The known spiculated nodule at the right lung apex is only faintly visualized radiographically. Electronically Signed   By: Keith Rake M.D.   On: 06/08/2020 18:43    PERFORMANCE STATUS (ECOG) : 3 - Symptomatic, >50% confined to bed  Review of Systems Confused  Physical Exam Full exam deferred  IMPRESSION: I met with patient's stepdaughter.  Together we reviewed patient's current medical problems.  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  We discussed giving him time to see how he does over the coming days.  Family is interested in trying to maximize his quality of life.  Dr. Mike Gip has also been consulted and can  address options for future cancer treatment.  Patient has some confusion.  He does not have a healthcare power of attorney document.  Patient has several sisters who live out of state.  I called his sister Bonnita Nasuti [phone]) who says she is most involved.  She requested that all medical decisions be deferred to patient's stepdaughter.  I have previously completed a MOST form with patient in the clinic.  At that time he opted for DNR/DNI.  PLAN: -Continue current scope of treatment -DNR/DNI -Will follow  Case and plan discussed with Dr. Mike Gip  Patient expressed understanding and was in agreement with this plan. He also understands that He can call the clinic at any time with any questions, concerns, or complaints.     Time Total: 30 minutes  Visit consisted of counseling and education dealing with the complex and emotionally intense issues of symptom management and palliative care in the setting of serious and potentially life-threatening illness.Greater than 50%  of this time was spent counseling and coordinating care related to the above assessment and plan.  Signed by: Altha Harm, PhD, NP-C

## 2020-06-18 NOTE — Evaluation (Signed)
Clinical/Bedside Swallow Evaluation Douglas [name] Details  Name: Douglas [name] MRN: [id number] Date of Birth: 1955-12-03  Today's Date: 06/18/2020 Time: SLP Start Time (ACUTE ONLY): 40 SLP Stop Time (ACUTE ONLY): 1600 SLP Time Calculation (min) (ACUTE ONLY): 45 min  Past Medical History:  Past Medical History:  Diagnosis Date  . Cancer ([place])    lung with brain mets  . COPD (chronic obstructive pulmonary disease) ([place])   . Hyperlipidemia   . Hypertension   . Lesion of adrenal gland ([place])   . Lung mass    Past Surgical History:  Past Surgical History:  Procedure Laterality Date  . LUNG BIOPSY  August '21   HPI:  Douglas [name] is a 64 y.o. male with baseline Severe COPD and chronic hypoxic failure on 2 L oxygen at home, recently diagnosed lung cancer metastatic under treatment at [facility], hypertension hyperlipidemia and recent admission for acute on chronic hypoxic respiratory failure secondary to COVID-19 diagnosed on 828 who presents via EMS from home for further for evaluation of confusion associated symptoms right hemibody weakness.  Douglas [name] reportedly prior to being hospitalized for Covid was independent and ambulatory without any significant effusion or focal deficits.  Since getting home from the hospital he has been more confused and has not been able to walk over the last 2 days.  Douglas [name] [name] also thinks this is over the time.  He has developed weakness in his right arm and right leg.  MRI: patchy acute to subacute left cerebral hemispheric infarcts(LMCA); Unchanged 3 mm enhancing focus in the right cerebellum, possibly metastasis vs infarct.  CXR: Stable right upper lobe density consistent with malignancy.    Assessment / Plan / Recommendation Clinical Impression  Douglas [name] presents at Baseline w/ Chronic, mild/dry Cough as Douglas [name] present but now w/ new R UE weakness; noted MRI results indicating patchy acute to subacute infarcts in the left MCAterritory. Douglas [name] also has Lung Cancer  (metastatic) and is on 5L O2 support. He presents w/ Cognitive-linguistic decline w/ mild Confusion but overall appears to present w/ grossly adequate oropharyngeal phase swallow w/ No immediate oropharyngeal phase dysphagia noted. He consumed po trials w/ SLP w/ No immediate, overt clinical s/s of aspiration; Douglas [name]'s mild/dry cough was apparent intermittently as it was at Baseline -- it did not increase intensity or frequency during/post po trials. Douglas [name] appears at reduced risk for aspiration when following general aspiration precautions w/ Supervision during oral intake for follow through w/ precautions; cues when needed.  During po trials, Douglas [name] consumed all consistencies w/ no decline in vocal quality, or change in respiratory presentation during/post trials. Oral phase appeared [facility] w/ timely bolus management and control of bolus propulsion for A-P transfer for swallowing. Oral clearing achieved w/ all trial consistencies given Time. Rest Breaks were given to reduce any SOB/WOB from the exertion of po tasks. Discussed this w/ [name] present in room in light of Douglas [name]'s Baseline Pulmonary decline as any increased WOB during oral intake can increase risk of aspiration. [name] agreed. Cursory OM Exam appeared [facility] w/ no unilateral weakness noted during bolus management, oral prep w/ boluses. Speech Clear but quick/rushed. Douglas [name] fed self w/ setup support but was confused as to his limitations(attempted to hold cup and feed self at same time then sucked on spoon handle).  Recommend a more Mech Soft/Regular consistency diet w/ well-Cut meats, moistened foods; Thin liquidsVIA CUP - Douglas [name] does not often use straws at home. Recommend more Finger Foods cut into smaller pieces -- [name] stated she will  monitor this at Douglas [name]'s meals. Recommend general aspiration precautions, Pills WHOLE in Puree for safer, easier swallowing. Education given on Pills in Puree; food consistencies and easy to eat options; general aspiration precautions. NSG  agreed. ST services will f/u w/ toleration of diet; education while admitted. SLP Visit Diagnosis: Dysphagia, unspecified (R13.10)      Aspiration Risk  Mild aspiration risk;Risk for inadequate nutrition/hydration (reduced when following general aspiration precautions)    Diet Recommendation  Regular/Mech Soft consistency diet -- cut all foods/meats well w/ gravies added to moisten; Thin liquids VIA CUP. General aspiration precautions and support/monitoring during meals d/t Confusion  Medication Administration: Whole meds with puree (as needed for safer swallowing during this time w/ confusion)    Other  Recommendations Recommended Consults:  (Dietician f/u) Oral Care Recommendations: Oral care BID;Oral care before and after PO;Staff/trained caregiver to provide oral care (Denture care) Other Recommendations:  (n/a)   Follow up Recommendations None (TBD)      Frequency and Duration min 2x/week  1 week       Prognosis Prognosis for Safe Diet Advancement: Fair (-Good) Barriers to Reach Goals: Cognitive deficits;Time post onset;Severity of deficits;Behavior (Ca w/ mets)      Swallow Study   General Date of Onset: 06/17/20 HPI: Douglas [name] is a 64 y.o. male with baseline Severe COPD and chronic hypoxic failure on 2 L oxygen at home, recently diagnosed lung cancer metastatic under treatment at [facility], hypertension hyperlipidemia and recent admission for acute on chronic hypoxic respiratory failure secondary to COVID-19 diagnosed on 828 who presents via EMS from home for further for evaluation of confusion associated symptoms right hemibody weakness.  Douglas [name] reportedly prior to being hospitalized for Covid was independent and ambulatory without any significant effusion or focal deficits.  Since getting home from the hospital he has been more confused and has not been able to walk over the last 2 days.  Douglas [name] [name] also thinks this is over the time.  He has developed weakness in his  right arm and right leg.  MRI: patchy acute to subacute left cerebral hemispheric infarcts; Unchanged 3 mm enhancing focus in the right cerebellum, possibly metastasis vs infarct.  CXR: Stable right upper lobe density consistent with malignancy.  Type of Study: Bedside Swallow Evaluation Previous Swallow Assessment: none reported Diet Prior to this Study: Regular;Thin liquids Temperature Spikes Noted: No (wbc 8.9) Respiratory Status: Nasal cannula (5L) History of Recent Intubation: No Behavior/Cognition: Alert;Cooperative;Pleasant mood;Confused;Distractible;Requires cueing Oral Cavity Assessment:  (adequate) Oral Care Completed by SLP: Recent completion by staff Oral Cavity - Dentition:  (Dentures) Vision: Functional for self-feeding Self-Feeding Abilities: Able to feed self;Needs assist;Needs set up;Total assist (Confusion) Douglas [name] Positioning: Upright in bed (assisted himself in sitting upright) Baseline Vocal Quality: Normal (quick, rushed speech; intelligible) Volitional Cough: Strong Volitional Swallow: Able to elicit    Oral/Motor/Sensory Function Overall Oral Motor/Sensory Function:  (appeared WFL w/ bolus management/prep)   Ice Chips Ice chips: Not tested   Thin Liquid Thin Liquid: Within functional limits (grossly; min decreased awareness) Presentation: Cup;Self Fed (8 trials) Other Comments: tended to take a quick sip but only 1 at a time as instructed    Nectar Thick Nectar Thick Liquid: Not tested   Honey Thick Honey Thick Liquid: Not tested   Puree Puree: Within functional limits (grossly; min+ decreased awareness) Presentation:  (assisted SLP by holding the cup; 7 trials)   Solid     Solid: Not tested Other Comments: Douglas [name] declined  Orinda Kenner, MS, CCC-SLP Speech Language Pathologist Rehab Services [phone] Ayme Short 06/18/2020,3:49 PM

## 2020-06-18 NOTE — ED Notes (Signed)
Pt assisted in repositioning in bed after placement of male purewick to assist with keeping skin clean and dry.

## 2020-06-18 NOTE — ED Notes (Signed)
Pt asleep in bed at this time, this nurse discussed visitor policy and no visitation on [facility] after pt is admitted to pt family member in room. Pt family member states that was a discussion last night and pt did not go up due to this. Pt family educated on progression of care and reason why pt cannot stay in ED through entirety of stay. Pt family member states after conversation, "we will see what happens when that time comes," referring to when pt has bed assignment, charge nurse updated.

## 2020-06-18 NOTE — ED Notes (Signed)
Pt asleep in bed, laying on right side. Pt daughter at bedside. Pt woken up for med administration. Pt follows commands despite verbal unhappiness

## 2020-06-18 NOTE — ED Notes (Signed)
Pt was given breakfast tray 

## 2020-06-18 NOTE — Progress Notes (Signed)
PT Cancellation Note  Patient Details Name: SUNIL HUE MRN: [id number] DOB: 22-Aug-1956   Cancelled Treatment:    Reason Eval/Treat Not Completed: Other (comment) Spoke with OT who reports she was able to eval pt and take a few steps with him.  Apparently the pt and wife requested that PT hold until tomorrow.  Will honor this request and attempt to evaluate pt tomorrow as appropriate.  Kreg Shropshire, DPT 06/18/2020, 3:38 PM

## 2020-06-19 ENCOUNTER — Ambulatory Visit: Payer: Medicare Other

## 2020-06-19 DIAGNOSIS — J9621 Acute and chronic respiratory failure with hypoxia: Secondary | ICD-10-CM

## 2020-06-19 DIAGNOSIS — I10 Essential (primary) hypertension: Secondary | ICD-10-CM

## 2020-06-19 LAB — ECHOCARDIOGRAM COMPLETE
AR max vel: 3.1 cm2
AV Area VTI: 3.1 cm2
AV Area mean vel: 3.65 cm2
AV Mean grad: 1 mmHg
AV Peak grad: 2.7 mmHg
Ao pk vel: 0.83 m/s
Area-P 1/2: 2.32 cm2
Height: 65 in
Weight: 2402.13 oz

## 2020-06-19 LAB — AMMONIA: Ammonia: 18 umol/L (ref 9–35)

## 2020-06-19 LAB — C-REACTIVE PROTEIN: CRP: 0.9 mg/dL (ref <1.0)

## 2020-06-19 NOTE — Progress Notes (Addendum)
PROGRESS NOTE    Douglas [name]  IFO:[id number] DOB: 1956-07-23 DOA: 06/17/2020 PCP: Patient, No Pcp Per   Brief Narrative: Taken from H&P Douglas [name] is a 64 y.o. male with medical history significant for severe COPD, chronic hypoxic respiratory failure initially on 2 L continuous nasal cannula but following a recent hospitalization for Covid pneumonia, he was discharged on 5 L of oxygen, hypertension, recently diagnosed lung cancer with brain mets,who was recently discharged from [facility] on 06/12/2020.  He was brought to the ER by his daughter for evaluation of mental status changes which she said has progressively worsened since his discharge from the hospital.  Subjective: No new complaints. Oriented to self only. Wants to go home.  Assessment & Plan:   Principal Problem:   AMS (altered mental status) Active Problems:   Hypertension   COPD (chronic obstructive pulmonary disease) ([place])   COVID-19 virus infection   Primary malignant neoplasm of lung with metastasis to brain [facility])   Chronic respiratory failure with hypoxia, on home O2 therapy (HCC)   Leukocytosis  Left MCA infarct.  Patient came with altered mental status, initial CT head was negative but subsequent MRI shows left MCA territory infarct.  Neurology was consulted and patient underwent stroke work-up. Neurology is okay with aspirin but Lipitor was discontinued due to decreased life expectancy secondary to metastatic lung disease. PT is recommending rehab.  Acute on chronic hypoxic respiratory failure.  Patient with metastatic lung cancer.  Recent diagnosis.  Also has recent COVID-19 infection earlier in August and completed the treatment at [facility]. -Continue supplemental oxygen.  Recent COVID-19 pneumonia.  Patient completed COVID-19 treatment at [facility].  He was diagnosed on 06/08/2020, discharged on 06/12/2020.  Inflammatory markers within normal limit. Initially  started on high-dose Decadron for concern of cerebral edema which was discontinued by oncology.  Metastatic lung cancer.  Patient recently diagnosed with metastatic lung cancer with mets to brain and bones.  Oncology was consulted and according to their note he is not a candidate for any chemotherapy due to poor functional status. He was evaluated for palliative radiation which has not been started yet. Palliative care was consulted-appreciate their help in his case. -Continue with pain management, which include OxyContin and oxycodone.  Essential hypertension.  Blood pressure elevated. -Continue losartan and HCTZ.  Objective: Vitals:   06/19/20 0000 06/19/20 0020 06/19/20 0145 06/19/20 0336  BP:  114/68 (!) 168/103 (!) 152/105  Pulse: 65 67 82 77  Resp:  20 20 17   Temp:   98.2 F (36.8 C) 98 F (36.7 C)  TempSrc:   Oral   SpO2: 95% 94% 94% 98%  Weight:      Height:       No intake or output data in the 24 hours ending 06/19/20 0807 Filed Weights   06/17/20 1035  Weight: 68.1 kg    Examination:  General exam: Emaciated gentleman, appears calm and comfortable  Respiratory system: Clear to auscultation.Decreased air entry bilaterally. Respiratory effort normal. Cardiovascular system: S1 & S2 heard, RRR. No JVD, murmurs, rubs,  Gastrointestinal system: Soft, nontender, nondistended, bowel sounds positive. Central nervous system: Alert and oriented to self only, able to follow commands.  Strength 4/5 on right upper and lower extremity.  5/5 on left. Extremities: No edema, no cyanosis, pulses intact and symmetrical. Psychiatry: Judgement and insight appear impaired.  DVT prophylaxis: Lovenox Code Status: DNR Family Communication: Daughter was updated on phone. She is interested in  taking him back home with hospice care. Disposition Plan:  Status is: Inpatient  Remains inpatient appropriate because:Inpatient level of care appropriate due to severity of illness   Dispo: The  patient is from: Home              Anticipated d/c is to: To be determined.              Anticipated d/c date is: 2 days              Patient currently is not medically stable to d/c. Daughter is interested in taking him back home with hospice care. We will discussed with TOC and palliative.   Consultants:   Cardiology  Neurology  Procedures:  Antimicrobials:   Data Reviewed: I have personally reviewed following labs and imaging studies  CBC: Recent Labs  Lab 06/17/20 1040 06/18/20 0523  WBC 16.0* 8.9  NEUTROABS 13.3*  --   HGB 10.7* 10.0*  HCT 32.9* 30.9*  MCV 91.4 91.7  PLT 461* 443*   Basic Metabolic Panel: Recent Labs  Lab 06/17/20 1040 06/18/20 0523  NA 139 140  K 3.8 4.4  CL 94* 98  CO2 33* 31  GLUCOSE 98 118*  BUN 15 15  CREATININE 1.05 0.95  CALCIUM 8.9 8.8*  MG 2.3  --    GFR: Estimated Creatinine Clearance: 67.4 mL/min (by C-G formula based on SCr of 0.95 mg/dL). Liver Function Tests: Recent Labs  Lab 06/17/20 1040  AST 23  ALT 24  ALKPHOS 68  BILITOT 0.8  PROT 6.9  ALBUMIN 3.3*   No results for input(s): LIPASE, AMYLASE in the last 168 hours. Recent Labs  Lab 06/19/20 0506  AMMONIA 18   Coagulation Profile: Recent Labs  Lab 06/17/20 1040  INR 1.0   Cardiac Enzymes: No results for input(s): CKTOTAL, CKMB, CKMBINDEX, TROPONINI in the last 168 hours. BNP (last 3 results) No results for input(s): PROBNP in the last 8760 hours. HbA1C: No results for input(s): HGBA1C in the last 72 hours. CBG: No results for input(s): GLUCAP in the last 168 hours. Lipid Profile: Recent Labs    06/18/20 0523  CHOL 124  HDL 58  LDLCALC 41  TRIG 126  CHOLHDL 2.1   Thyroid Function Tests: Recent Labs    06/17/20 1040  TSH 1.628   Anemia Panel: No results for input(s): VITAMINB12, FOLATE, FERRITIN, TIBC, IRON, RETICCTPCT in the last 72 hours. Sepsis Labs: Recent Labs  Lab 06/17/20 1040  PROCALCITON <0.10    No results found for this  or any previous visit (from the past 240 hour(s)).   Radiology Studies: DG Chest 1 View  Result Date: 06/17/2020 CLINICAL DATA:  History of metastatic lung cancer. Altered mental status. EXAM: CHEST  1 VIEW COMPARISON:  June 11, 2020. FINDINGS: The heart size and mediastinal contours are within normal limits. Left lung is clear. Right upper lobe irregular density is again noted consistent with malignancy. No pneumothorax or pleural effusion is noted. The visualized skeletal structures are unremarkable. IMPRESSION: Stable right upper lobe density consistent with malignancy. No other abnormality seen in the chest. Electronically Signed   By: Marijo Conception M.D.   On: 06/17/2020 13:18   CT HEAD WO CONTRAST  Result Date: 06/17/2020 CLINICAL DATA:  Altered mental status. History of metastatic lung cancer. EXAM: CT HEAD WITHOUT CONTRAST TECHNIQUE: Contiguous axial images were obtained from the base of the skull through the vertex without intravenous contrast. COMPARISON:  May 29, 2020. FINDINGS: Brain: Mild  chronic ischemic white matter disease is noted. No mass effect or midline shift is noted. Ventricular size is within normal limits. There is no evidence of mass lesion, hemorrhage or acute infarction. Possible metastatic lesions noted on recent MRI are not visualized on this study. Vascular: No hyperdense vessel or unexpected calcification. Skull: No definite fracture is noted. Multiple small rounded lucencies are noted throughout the skull which may represent benign venous lakes, but metastatic disease cannot be excluded given the history of metastatic lung cancer. Sinuses/Orbits: No acute finding. Other: None. IMPRESSION: 1. Mild chronic ischemic white matter disease. No acute intracranial abnormality seen. 2. Multiple small rounded lucencies are noted throughout the skull which may represent benign venous lakes, but metastatic disease cannot be excluded given the history of metastatic lung cancer.  Electronically Signed   By: Marijo Conception M.D.   On: 06/17/2020 11:59   MR BRAIN W WO CONTRAST  Result Date: 06/17/2020 CLINICAL DATA:  Confusion with right arm and leg weakness. History of metastatic lung cancer. EXAM: MRI HEAD WITHOUT AND WITH CONTRAST TECHNIQUE: Multiplanar, multiecho pulse sequences of the brain and surrounding structures were obtained without and with intravenous contrast. CONTRAST:  31mL GADAVIST GADOBUTROL 1 MMOL/ML IV SOLN COMPARISON:  Head CT 06/17/2020 and MRI 05/29/2020 FINDINGS: Brain: There are patchy acute to subacute infarcts in the left MCA territory, overall moderate in volume with greatest involvement of the lateral frontal and parietal cortex. There is also border zone involvement in the lateral left occipital lobe. Associated small volume petechial blood products are noted in the left frontoparietal and left occipital infarcts, and there is also an unchanged chronic microhemorrhage in the posterior left temporal lobe. Punctate acute infarcts are present in the left basal ganglia. Periventricular white matter T2 hyperintensities are unchanged from the prior MRI and are nonspecific but compatible with mild chronic small vessel ischemic disease. Chronic lacunar infarcts are noted in the thalami and right lentiform nucleus. An unchanged T2 hyperintense focus in the right midbrain may represent a dilated perivascular space or chronic lacunar infarct. There is mild cerebral atrophy. No midline shift or extra-axial fluid collection is evident. A plaque-like 3 mm enhancing focus along the inferior right cerebellar hemisphere is unchanged from the prior MRI (series 21, image 31). The punctate enhancing focus in the medial left cerebellar hemisphere on the prior MRI is not evident today. A 2 mm focus of cortical enhancement in left frontoparietal cortex is secondary to the recent infarct. Vascular: Major intracranial vascular flow voids are preserved. Skull and upper cervical spine:  Small enhancing foci in the clivus are unchanged (series 21, image 38). A 2 cm T1 hypointense lesion in the right parietal skull including a punctate focus of internal enhancement is also unchanged (series 16, image 129 and series 21, image 134). Sinuses/Orbits: Unremarkable orbits. Mild left maxillary sinus mucosal thickening. Small right mastoid effusion. Other: None. IMPRESSION: 1. Patchy acute to subacute left cerebral hemispheric infarcts primarily in the MCA territory. 2. Unchanged 3 mm enhancing focus in the right cerebellum, possibly a metastasis although a subacute infarct is an alternative consideration. 3. Nonvisualization of the punctate enhancing focus in the left cerebellum on the prior MRI. No evidence of new intracranial metastases. 4. Unchanged small enhancing calvarial lesions. Electronically Signed   By: Logan Bores M.D.   On: 06/17/2020 20:06   US Carotid Bilateral  Result Date: 06/18/2020 CLINICAL DATA:  Acute cerebral infarcts. EXAM: BILATERAL CAROTID DUPLEX ULTRASOUND TECHNIQUE: Pearline Cables scale imaging, color Doppler and duplex ultrasound  were performed of bilateral carotid and vertebral arteries in the neck. COMPARISON:  None. FINDINGS: Criteria: Quantification of carotid stenosis is based on velocity parameters that correlate the residual internal carotid diameter with NASCET-based stenosis levels, using the diameter of the distal internal carotid lumen as the denominator for stenosis measurement. The following velocity measurements were obtained: RIGHT ICA:  133/51 cm/sec CCA:  16/10 cm/sec SYSTOLIC ICA/CCA RATIO:  1.4 ECA:  199 cm/sec LEFT ICA:  114/37 cm/sec CCA:  960/45 cm/sec SYSTOLIC ICA/CCA RATIO:  1.1 ECA:  168 cm/sec RIGHT CAROTID ARTERY: Mild to moderate calcified plaque at the level of the distal bulb and proximal right ICA. Estimated right ICA stenosis is 50-69%. RIGHT VERTEBRAL ARTERY: Antegrade flow with normal waveform and velocity. LEFT CAROTID ARTERY: Mild to moderate  predominately calcified plaque at the level of the left carotid bulb and proximal left ICA. Estimated left ICA stenosis is less than 50% based on velocities. LEFT VERTEBRAL ARTERY: Antegrade flow with normal waveform and velocity. IMPRESSION: Mild to moderate calcified plaque at the level of bilateral carotid bulbs and proximal internal carotid arteries. Estimated right ICA stenosis is 50-69% and estimated left ICA stenosis is less than 50%. Electronically Signed   By: Aletta Edouard M.D.   On: 06/18/2020 09:25    Scheduled Meds: . aspirin EC  325 mg Oral Daily  . atorvastatin  40 mg Oral QPM  . enoxaparin (LOVENOX) injection  40 mg Subcutaneous Q24H  . gabapentin  300 mg Oral BID  . losartan  50 mg Oral Daily   And  . hydrochlorothiazide  12.5 mg Oral Daily  . mometasone-formoterol  2 puff Inhalation BID   And  . umeclidinium bromide  1 puff Inhalation Daily  . oxyCODONE  10 mg Oral Q8H  . pantoprazole  40 mg Oral Daily  . sodium chloride flush  3 mL Intravenous Once  . sodium chloride flush  3 mL Intravenous Q12H  . sucralfate  1 g Oral TID WC & HS  . traZODone  50 mg Oral QHS   Continuous Infusions: . sodium chloride       LOS: 2 days   Time spent: 40 minutes.  Lorella Nimrod, MD [facility]  If 7PM-7AM, please contact night-coverage [email or web address]  06/19/2020, 8:07 AM   This record has been created using Systems analyst. Errors have been sought and corrected,but may not always be located. Such creation errors do not reflect on the standard of care.

## 2020-06-19 NOTE — Evaluation (Addendum)
Physical Therapy Evaluation Patient Details Name: Douglas [name] MRN: [id number] DOB: Mar 28, 1956 Today's Date: 06/19/2020   History of Present Illness  Douglas [name] is a 64 y.o. male with multiple medical problems including COPD, tobacco abuse, hypertension, hyperlipidemia, who was hospitalized 05/16/2020-05/17/2020 with dysphagia and unintentional weight loss. Patient was found to have a right lung mass, right adrenal lesion, and diffuse circumferential esophageal thickening on CT. Barium swallow on 05/17/2020 revealed reflux and esophagitis. 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  Patient is now hospitalized 06/17/2020 with altered mental status.  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  He is referred to palliative care to help address goals and manage ongoing symptoms  Clinical Impression  Patient sitting upright in bed upon arrival to room, slightly agitated, greeting therapist with "There you are.  It's about time!"  Reoriented patient to location, situation and therapist person/role.  Patient calmed and requested being allowed to "look out the window".  Agreeable to participation with initial assessment prior to OOB attempts.  Patient notably confused at times.  Follows commands but verbally perseverative with interview and conversation.  R UE > LE hemiparesis evident with noted deficits in muscle strength, control/coordination (distally > proximally), often requiring visual feedback for awareness and optimal control/use of R UE with functional activities.  Patient denies sensory deficit to R hemi-body, but functional performance suggests some degree of sensory impairment and overall inattention to R UE > LE.  Currently requiring cga/min assist for bed mobility; min/mod assist  for sit/stand, basic transfers and short-distance gait (5' forward/backward) with RW.  Inconsistent R LE step length, poor balance/increased sway in all planes; hand-over-hand for R UE position and grasp on RW. Unsafe to attempt without RW and +1 hands-on assist at all times. Would benefit from skilled PT to address above deficits and promote optimal return to PLOF. recommend transition to STR upon discharge from acute hospitalization.     Follow Up Recommendations SNF (will continue to follow/update as appropriate)    Equipment Recommendations       Recommendations for Other Services       Precautions / Restrictions Precautions Precautions: Fall Restrictions Weight Bearing Restrictions: No      Mobility  Bed Mobility Overal bed mobility: Needs Assistance Bed Mobility: Supine to Sit     Supine to sit: Min guard;Min assist        Transfers Overall transfer level: Needs assistance Equipment used: Rolling walker (2 wheeled) Transfers: Sit to/from Stand Sit to Stand: Min assist;Mod assist         General transfer comment: cuing for hand placement, visual input required for optimal R UE position and control (hand over hand at times); impulsive with transitional movements  Ambulation/Gait Ambulation/Gait assistance: Min assist;Mod assist Gait Distance (Feet):  (5' forward/backward x2) Assistive device: Rolling walker (2 wheeled)       General Gait Details: 3-point, step to gait pattern; hand-over-hand to maintain R UE placement on RW; poor balance, increased sway in all planes.  Stairs            Wheelchair Mobility    Modified Rankin (Stroke Patients Only)       Balance Overall balance assessment: Needs assistance Sitting-balance support: No upper extremity supported;Feet supported Sitting balance-Leahy Scale: Fair     Standing balance support: Bilateral upper extremity supported  Standing balance-Leahy Scale: Poor                                Pertinent Vitals/Pain Pain Assessment: No/denies pain    Home Living Family/patient expects to be discharged to:: Private residence   Available Help at Discharge: Family;Available 24 hours/day Type of Home: Apartment Home Access: Stairs to enter   Entrance Stairs-Number of Steps: 3 Home Layout: One level        Prior Function Level of Independence: Independent         Comments: Indep without assist device for ADLs, household and community mobilization; home O2 at 2L per chart.  Does endorse some degree of fall history, difficulty quantifying     Hand Dominance   Dominant Hand: Right    Extremity/Trunk Assessment   Upper Extremity Assessment Upper Extremity Assessment:  (R shoulder and elbow grossly 4-/5 throughout, R hand 3/5, generally discoordinated with noted inattention/suspected sensory deficit.  L UE grossly WFL)    Lower Extremity Assessment Lower Extremity Assessment:  (R LE grossly 4-/5 throughout, generally discoordinated and impulsive in movement, noted inattention)       Communication   Communication: Expressive difficulties (verbally perseverative at times)  Cognition Arousal/Alertness: Awake/alert Behavior During Therapy: Impulsive Overall Cognitive Status: Impaired/Different from baseline                                 General Comments: impulsive, cuing for redirection to task; oriented to self, location-verbally perseverative at times      General Comments      Exercises     Assessment/Plan    PT Assessment Patient needs continued PT services  PT Problem List Decreased activity tolerance;Decreased strength;Decreased range of motion;Decreased balance;Decreased coordination;Decreased cognition;Decreased mobility;Decreased knowledge of use of DME;Decreased safety awareness;Decreased knowledge of precautions;Cardiopulmonary status limiting activity       PT Treatment Interventions DME instruction;Stair  training;Functional mobility training;Neuromuscular re-education;Balance training;Therapeutic activities;Gait training;Therapeutic exercise;Patient/family education;Cognitive remediation    PT Goals (Current goals can be found in the Care Plan section)  Acute Rehab PT Goals Patient Stated Goal: to look out the window PT Goal Formulation: With patient Time For Goal Achievement: 07/03/20 Potential to Achieve Goals: Fair    Frequency Min 2X/week   Barriers to discharge        Co-evaluation               AM-PAC PT "6 Clicks" Mobility  Outcome Measure Help needed turning from your back to your side while in a flat bed without using bedrails?: None Help needed moving from lying on your back to sitting on the side of a flat bed without using bedrails?: A Little Help needed moving to and from a bed to a chair (including a wheelchair)?: A Little Help needed standing up from a chair using your arms (e.g., wheelchair or bedside chair)?: A Little Help needed to walk in hospital room?: A Lot Help needed climbing 3-5 steps with a railing? : A Lot 6 Click Score: 17    End of Session Equipment Utilized During Treatment: Oxygen Activity Tolerance: Patient tolerated treatment well Patient left: in chair;with call bell/phone within reach;with chair alarm set Nurse Communication: Mobility status PT Visit Diagnosis: Unsteadiness on feet (R26.81);Muscle weakness (generalized) (M62.81);Hemiplegia and hemiparesis Hemiplegia - Right/Left: Right Hemiplegia - dominant/non-dominant: Dominant    Time: 6237-6283 PT Time Calculation (min) (ACUTE ONLY):  28 min   Charges:   PT Evaluation $PT Eval Moderate Complexity: 1 Mod PT Treatments $Therapeutic Activity: 8-22 mins        Dishawn Bhargava H. Owens Shark, PT, DPT, NCS 06/19/20, 3:56 PM [phone]

## 2020-06-19 NOTE — Progress Notes (Addendum)
AC Note:  Notified by ED charge RN that patient step daughter was not being cooperative with following visitor policy and allowing patient to be moved to covid unit. I spoke with step daughter last night regarding this patient being moved to the covid unit and after stepdaughter was found walking around the covid unit she went back to the ED. Floor coverage provider, myself and charge RN spoke with this patient last night and no resolution was obtained. Infection prevention was also involved and consensus was to uphold visitor policy. Patient bed was given away and patient remained in the ED last night. When I checked in with the ED Charge tonight I found that stepdaughter was still at bedside and patient had not been moved. This RN and ED charge went to speak with stepdaughter. She began to express concerns about care being provided while in the ED and that due to patient having a stroke and being confused he would be unable to communicate with her. She stated he needed to be around her. I attempted to communicate to her how the communication worked on the covid unit to alleviate concerns however I was interrupted and she seemed to become irritated with conversation related to this patient being moved. I explained to her that again we were attempting to get him a bed on the covid unit so that he could receive the care and attention more appropriate to the inpatient unit. I also made her aware that per our policy he would require covid isolation for 21 days from his initial diagnosis. She then said I am going to get him out of this hospital tomorrow. The conversation ended with her agreeing to allow the patient to be moved to the covid unit and she said she would go home once he moved. A bed was assigned and the covid unit was notified. Within about 30-45 minutes I was called stating the stepdaughter was again interfering with the patient being sent to the unit. I asked the ED charge to contact the floor coverage  provider to speak with the stepdaughter to communicate the need for [place] to move this patient in order to better care for him. I advised him to let me know should they need anything. Per ED charge they would be speaking with step daughter together.

## 2020-06-19 NOTE — ED Notes (Signed)
Charge, RN and Benjamine Mola, NP to speak with Pt daughter in regards to pt plan of care.

## 2020-06-19 NOTE — ED Notes (Signed)
Pt daughter updated on bed assignment and plan to take pt to floor. Pt daughter questions time of bed assignment and going to the [place] [facility], this nurse answers questions pt daughter asks at best of ability. Pt daughter requests to speak to charge nurse at this time due to have already spoken to Charge and [place]. Charge notified.

## 2020-06-19 NOTE — Progress Notes (Signed)
[place]  [phone] [phone]   Name: Douglas [name] Date: 06/19/2020 MRN: [id number]  DOB: 1956-09-27  Patient Care Team: Patient, No Pcp Per as PCP - General (General Practice) [name], MD as Referring Physician (Hematology and Oncology) Noreene Filbert, MD as Referring Physician (Radiation Oncology) Lucilla Lame, MD as Consulting Physician (Gastroenterology)    REASON FOR CONSULTATION: Douglas [name] is a 64 y.o. male with multiple medical problems including COPD, tobacco abuse, hypertension, hyperlipidemia, who was hospitalized 05/16/2020-05/17/2020 with dysphagia and unintentional weight loss.Patient was found to have a right lung mass, right adrenal lesion, and diffuse circumferential esophageal thickening on CT. Barium swallowon 8/6/2021revealed reflux and esophagitis. 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  Patient is now hospitalized 06/17/2020 with altered mental status.  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  He is referred to palliative care to help address goals and manage ongoing symptoms..    CODE STATUS: DNR  PAST MEDICAL HISTORY: Past Medical History:  Diagnosis Date  . Cancer ([place])    lung with brain mets  . COPD (chronic obstructive pulmonary disease) ([place])   . Hyperlipidemia   . Hypertension   . Lesion of adrenal gland ([place])   . Lung mass     PAST SURGICAL HISTORY:  Past Surgical History:  Procedure Laterality Date  . LUNG BIOPSY  August '21    HEMATOLOGY/ONCOLOGY HISTORY:  Oncology History   No history exists.    ALLERGIES:  has No Known Allergies.  MEDICATIONS:  Current Facility-Administered Medications  Medication Dose Route  Frequency Provider Last Rate Last Admin  . 0.9 %  sodium chloride infusion  250 mL Intravenous PRN Agbata, Tochukwu, MD      . albuterol (VENTOLIN HFA) 108 (90 Base) MCG/ACT inhaler 1-2 puff  1-2 puff Inhalation Q6H PRN Agbata, Tochukwu, MD      . aspirin EC tablet 325 mg  325 mg Oral Daily Loletha Grayer, MD   325 mg at 06/19/20 0812  . atorvastatin (LIPITOR) tablet 40 mg  40 mg Oral QPM Loletha Grayer, MD   40 mg at 06/18/20 1834  . enoxaparin (LOVENOX) injection 40 mg  40 mg Subcutaneous Q24H Agbata, Tochukwu, MD   40 mg at 06/18/20 2254  . gabapentin (NEURONTIN) capsule 300 mg  300 mg Oral BID Agbata, Tochukwu, MD   300 mg at 06/19/20 0812  . haloperidol lactate (HALDOL) injection 1 mg  1 mg Intravenous Q6H PRN Agbata, Tochukwu, MD   1 mg at 06/18/20 1954  . losartan (COZAAR) tablet 50 mg  50 mg Oral Daily Benn Moulder, RPH   50 mg at 06/19/20 1027   And  . hydrochlorothiazide (MICROZIDE) capsule 12.5 mg  12.5 mg Oral Daily Benn Moulder, RPH   12.5 mg at 06/19/20 2536  . mometasone-formoterol (DULERA) 200-5 MCG/ACT inhaler 2 puff  2 puff Inhalation BID Agbata, Tochukwu, MD   2 puff at 06/18/20 1948   And  . umeclidinium bromide (INCRUSE ELLIPTA) 62.5 MCG/INH 1 puff  1 puff Inhalation Daily Agbata, Tochukwu, MD   1 puff at 06/18/20 0917  . nicotine (NICODERM CQ - dosed in mg/24 hours) patch 14 mg  14 mg Transdermal Daily PRN Agbata, Tochukwu, MD      . ondansetron (ZOFRAN) tablet 4  mg  4 mg Oral Q6H PRN Agbata, Tochukwu, MD       Or  . ondansetron (ZOFRAN) injection 4 mg  4 mg Intravenous Q6H PRN Agbata, Tochukwu, MD      . oxyCODONE (Oxy IR/ROXICODONE) immediate release tablet 5-10 mg  5-10 mg Oral Q4H PRN Agbata, Tochukwu, MD   10 mg at 06/18/20 0517  . oxyCODONE (OXYCONTIN) 12 hr tablet 10 mg  10 mg Oral Q8H Agbata, Tochukwu, MD   10 mg at 06/19/20 0812  . pantoprazole (PROTONIX) EC tablet 40 mg  40 mg Oral Daily Agbata, Tochukwu, MD   40 mg at 06/19/20 0812  . sodium  chloride flush (NS) 0.9 % injection 3 mL  3 mL Intravenous Once Lucrezia Starch, MD      . sodium chloride flush (NS) 0.9 % injection 3 mL  3 mL Intravenous Q12H Agbata, Tochukwu, MD   3 mL at 06/19/20 0813  . sodium chloride flush (NS) 0.9 % injection 3 mL  3 mL Intravenous PRN Agbata, Tochukwu, MD      . sucralfate (CARAFATE) tablet 1 g  1 g Oral TID WC & HS Agbata, Tochukwu, MD   1 g at 06/19/20 0812  . traZODone (DESYREL) tablet 50 mg  50 mg Oral QHS Loletha Grayer, MD   50 mg at 06/18/20 2254    VITAL SIGNS: BP (!) 159/87 (BP Location: Left Arm)   Pulse (!) 101   Temp 98.4 F (36.9 C) (Oral)   Resp 20   Ht 5\' 5"  (1.651 m)   Wt 150 lb 2.1 oz (68.1 kg)   SpO2 99%   BMI 24.98 kg/m  Filed Weights   06/17/20 1035  Weight: 150 lb 2.1 oz (68.1 kg)    Estimated body mass index is 24.98 kg/m as calculated from the following:   Height as of this encounter: 5\' 5"  (1.651 m).   Weight as of this encounter: 150 lb 2.1 oz (68.1 kg).  LABS: CBC:    Component Value Date/Time   WBC 8.9 06/18/2020 0523   HGB 10.0 (L) 06/18/2020 0523   HCT 30.9 (L) 06/18/2020 0523   PLT 414 (H) 06/18/2020 0523   MCV 91.7 06/18/2020 0523   NEUTROABS 13.3 (H) 06/17/2020 1040   LYMPHSABS 1.2 06/17/2020 1040   MONOABS 1.2 (H) 06/17/2020 1040   EOSABS 0.2 06/17/2020 1040   BASOSABS 0.0 06/17/2020 1040   Comprehensive Metabolic Panel:    Component Value Date/Time   NA 140 06/18/2020 0523   K 4.4 06/18/2020 0523   CL 98 06/18/2020 0523   CO2 31 06/18/2020 0523   BUN 15 06/18/2020 0523   CREATININE 0.95 06/18/2020 0523   GLUCOSE 118 (H) 06/18/2020 0523   CALCIUM 8.8 (L) 06/18/2020 0523   AST 23 06/17/2020 1040   ALT 24 06/17/2020 1040   ALKPHOS 68 06/17/2020 1040   BILITOT 0.8 06/17/2020 1040   PROT 6.9 06/17/2020 1040   ALBUMIN 3.3 (L) 06/17/2020 1040    RADIOGRAPHIC STUDIES: DG Chest 1 View  Result Date: 06/17/2020 CLINICAL DATA:  History of metastatic lung cancer. Altered mental status.  EXAM: CHEST  1 VIEW COMPARISON:  June 11, 2020. FINDINGS: The heart size and mediastinal contours are within normal limits. Left lung is clear. Right upper lobe irregular density is again noted consistent with malignancy. No pneumothorax or pleural effusion is noted. The visualized skeletal structures are unremarkable. IMPRESSION: Stable right upper lobe density consistent with malignancy. No other abnormality seen in the  chest. Electronically Signed   By: Marijo Conception M.D.   On: 06/17/2020 13:18   CT HEAD WO CONTRAST  Result Date: 06/17/2020 CLINICAL DATA:  Altered mental status. History of metastatic lung cancer. EXAM: CT HEAD WITHOUT CONTRAST TECHNIQUE: Contiguous axial images were obtained from the base of the skull through the vertex without intravenous contrast. COMPARISON:  May 29, 2020. FINDINGS: Brain: Mild chronic ischemic white matter disease is noted. No mass effect or midline shift is noted. Ventricular size is within normal limits. There is no evidence of mass lesion, hemorrhage or acute infarction. Possible metastatic lesions noted on recent MRI are not visualized on this study. Vascular: No hyperdense vessel or unexpected calcification. Skull: No definite fracture is noted. Multiple small rounded lucencies are noted throughout the skull which may represent benign venous lakes, but metastatic disease cannot be excluded given the history of metastatic lung cancer. Sinuses/Orbits: No acute finding. Other: None. IMPRESSION: 1. Mild chronic ischemic white matter disease. No acute intracranial abnormality seen. 2. Multiple small rounded lucencies are noted throughout the skull which may represent benign venous lakes, but metastatic disease cannot be excluded given the history of metastatic lung cancer. Electronically Signed   By: Marijo Conception M.D.   On: 06/17/2020 11:59   MR BRAIN W WO CONTRAST  Result Date: 06/17/2020 CLINICAL DATA:  Confusion with right arm and leg weakness. History of  metastatic lung cancer. EXAM: MRI HEAD WITHOUT AND WITH CONTRAST TECHNIQUE: Multiplanar, multiecho pulse sequences of the brain and surrounding structures were obtained without and with intravenous contrast. CONTRAST:  55mL GADAVIST GADOBUTROL 1 MMOL/ML IV SOLN COMPARISON:  Head CT 06/17/2020 and MRI 05/29/2020 FINDINGS: Brain: There are patchy acute to subacute infarcts in the left MCA territory, overall moderate in volume with greatest involvement of the lateral frontal and parietal cortex. There is also border zone involvement in the lateral left occipital lobe. Associated small volume petechial blood products are noted in the left frontoparietal and left occipital infarcts, and there is also an unchanged chronic microhemorrhage in the posterior left temporal lobe. Punctate acute infarcts are present in the left basal ganglia. Periventricular white matter T2 hyperintensities are unchanged from the prior MRI and are nonspecific but compatible with mild chronic small vessel ischemic disease. Chronic lacunar infarcts are noted in the thalami and right lentiform nucleus. An unchanged T2 hyperintense focus in the right midbrain may represent a dilated perivascular space or chronic lacunar infarct. There is mild cerebral atrophy. No midline shift or extra-axial fluid collection is evident. A plaque-like 3 mm enhancing focus along the inferior right cerebellar hemisphere is unchanged from the prior MRI (series 21, image 31). The punctate enhancing focus in the medial left cerebellar hemisphere on the prior MRI is not evident today. A 2 mm focus of cortical enhancement in left frontoparietal cortex is secondary to the recent infarct. Vascular: Major intracranial vascular flow voids are preserved. Skull and upper cervical spine: Small enhancing foci in the clivus are unchanged (series 21, image 38). A 2 cm T1 hypointense lesion in the right parietal skull including a punctate focus of internal enhancement is also unchanged  (series 16, image 129 and series 21, image 134). Sinuses/Orbits: Unremarkable orbits. Mild left maxillary sinus mucosal thickening. Small right mastoid effusion. Other: None. IMPRESSION: 1. Patchy acute to subacute left cerebral hemispheric infarcts primarily in the MCA territory. 2. Unchanged 3 mm enhancing focus in the right cerebellum, possibly a metastasis although a subacute infarct is an alternative consideration. 3. Nonvisualization  of the punctate enhancing focus in the left cerebellum on the prior MRI. No evidence of new intracranial metastases. 4. Unchanged small enhancing calvarial lesions. Electronically Signed   By: Logan Bores M.D.   On: 06/17/2020 20:06   MR BRAIN W WO CONTRAST  Result Date: 05/29/2020 CLINICAL DATA:  Neoplasm, staging. EXAM: MRI HEAD WITHOUT AND WITH CONTRAST MRI CERVICAL SPINE WITHOUT AND WITH CONTRAST MRI LUMBAR SPINE WITHOUT AND WITH CONTRAST TECHNIQUE: Multiplanar, multiecho pulse sequences of the brain and surrounding structures, and cervical spine, to include the craniocervical junction and cervicothoracic junction, were obtained without and with intravenous contrast. Multiplanar and multiecho pulse sequences of the lumbar spine were obtained without and with intravenous contrast. CONTRAST:  72mL GADAVIST GADOBUTROL 1 MMOL/ML IV SOLN COMPARISON:  05/02/2020 lumbar spine radiographs. 05/22/2020 nuclear medicine PET scan. FINDINGS: MRI HEAD FINDINGS Brain: Scattered T2 hyperintense periventricular and subcortical white matter foci are nonspecific however commonly associated with chronic microvascular ischemic changes. Mild cerebral atrophy with ex vacuo dilatation. No acute infarct or intracranial hemorrhage. Tiny left temporal remote microhemorrhage. Sequela of remote left thalamic and right midbrain insults. No midline shift, ventriculomegaly or extra-axial fluid collection. Normal appearance of the sella turcica and pituitary gland. -3 mm inferior right cerebellar  enhancing focus (18:14). -punctate medial left cerebellar enhancement (18:34). Vascular: Major intracranial flow voids are preserved. Skull and upper cervical spine: 2 mm right parietal enhancing focus (18:120). Scattered enhancing foci measuring up to 4 mm within the basilar and anterolateral occipital bone (18:24). Sinuses/Orbits: Normal orbits. Minimal ethmoid and left maxillary sinus mucosal thickening. Trace right mastoid effusion. Other: 6 mm nonenhancing diffusion restricting right occipital scalp nodule (5:16), likely an epidermal inclusion cyst. MRI CERVICAL SPINE FINDINGS Alignment: Normal. Vertebrae: Diffusely T1 hypointense appearance of the bone marrow. Vertebral body heights are preserved. Bone marrow edema involving the left C4-5 and C5-6 facet joints. Cord: Normal signal intensity morphology.  No abnormal enhancement. Posterior Fossa, vertebral arteries: Please see MRI brain. Disc levels: Multilevel desiccation.  Mild C5-6 disc space loss. C2-3: No significant disc bulge, spinal canal or neural foraminal narrowing. C3-4: Small central protrusion with uncovertebral and facet hypertrophy. Patent spinal canal and left neural foramen. Mild right neural foraminal narrowing. C4-5: There is ill-defined enhancing soft tissue encasing the left C4-5 facet joint measuring 2.0 x 1.6 cm (12:16). The soft tissue extends into the left neural and transverse foramen, encasing the left vertebral artery (12:15). Left predominant and dorsal C5 vertebral body erosions. Prevertebral soft tissue thickening and enhancement is concerning for extension of tumor. There is also ill-defined enhancement along the right anterolateral C5 vertebral body (12:15). Inflammatory changes are seen within the left paraspinal region (11:13). Circumferential soft tissue thickening and enhancement involving the epidural space at the C4-5 level. Small disc osteophyte complex with uncovertebral and right facet hypertrophy. Mild spinal canal  narrowing. Patent right neural foramen. Moderate left neural foraminal narrowing secondary to soft tissue encroachment. C5-6: There is inferior extension of ill-defined soft tissue along the left transverse/neural foramen and anterior left facet joint (12:12) left facet joint bone marrow edema. Prominence of mild enhancement involving the epidural space. Disc osteophyte complex with uncovertebral and facet hypertrophy. Mild spinal canal and right neural foraminal narrowing. Soft tissue encroachment narrows the left neural foramen. C6-7: Disc osteophyte complex with uncovertebral and facet hypertrophy. Patent spinal canal and bilateral neural foramina. C7-T1: Disc osteophyte complex with uncovertebral and facet hypertrophy. Patent spinal canal and neural foramen. Paraspinal tissues: As detailed above. MRI LUMBAR SPINE FINDINGS  Segmentation:  Standard. Alignment:  Normal. Vertebrae: Diffuse bone marrow heterogeneity. Multilevel osteophytosis. Metastases are detailed below. Conus medullaris and cauda equina: Conus extends to the L2 level. Conus and cauda equina appear normal. Filar lipoma. Disc levels: Multilevel desiccation and disc space loss most prominent at the L5-S1 level. L1-2: No significant disc bulge, spinal canal or neural foraminal narrowing. L2-3: Disc bulge, ligamentum flavum and bilateral facet hypertrophy. Mild spinal canal and bilateral neural foraminal narrowing. L3-4: L3 superior endplate pathologic fracture deformity with approximately 10-20% height loss. 4.7 x 3.4 cm enhancing metastatic lesion involving the right L3 vertebral body and posterior elements (13:19). There is extension of enhancing tumor into the prevertebral and anterior epidural spaces at the L3 level (13:20, 8:19) effacing the lateral recesses and abutting the exiting bilateral L3 nerve roots. Ligamentum flavum and bilateral facet hypertrophy. Mild spinal canal and bilateral neural foraminal narrowing. L4-5: Disc bulge abutting the  exiting left L4 nerve root, ligamentum flavum and bilateral facet hypertrophy. Mild spinal canal and moderate bilateral neural foraminal narrowing. Abnormal right L4-5 neural foramen soft tissue is discussed below. L5-S1: Disc bulge with superimposed central protrusion abutting the descending bilateral S1 nerve roots, ligamentum flavum and bilateral facet hypertrophy. Patent spinal canal. Mild bilateral neural foraminal narrowing. Paraspinal and other soft tissues: At the level of the lumbosacral junction and extending along the right sacroiliac joint there is enhancing soft tissue measuring 4.2 x 2.6 cm (13:34). Soft tissue extends into the right L4-5 neural foramen (8:31) there are adjacent erosions involving the right lateral L5 vertebral body and superior right sacral ala. Anterior cortical defect is concerning for an associated pathologic fracture (8:36). IMPRESSION: MRI brain: -Sub-5 mm bilateral cerebellar enhancing foci are concerning for metastases. -Sub-5 mm right parietal bone and basilar occipital enhancing foci, concerning for osseous metastases. -Sequela of remote left thalamic and right midbrain insults. Mild cerebral atrophy and chronic microvascular ischemic changes. MRI cervical spine: -Metastatic soft tissue involving the left C5-6 facet joint and right anterolateral C5 vertebral body. There is extension of tumor into the prevertebral and circumferential epidural space at the C5-6 level without significant spinal canal narrowing. -Tumor also extends to involve the left C5-6 and C6-7 transverse and neural foramen with partial encasement of the vertebral artery. -No evidence of spinal cord enhancement. MRI lumbar spine: - L3 metastatic lesion and pathologic fracture with approximately 20% height loss. There is extension of tumor into the prevertebral and anterior epidural space at the L3 level with mild spinal canal and bilateral neural foraminal narrowing. - Partially imaged 4.2 cm right  lumbosacral junction/SI joint metastasis with extension of tumor into the right L4-5 neural foramen. Query associated pathologic fracture. - Please see concurrent MR sacrum for better characterization. These results will be called to the ordering clinician or representative by the Radiologist Assistant, and communication documented in the PACS or [employer]. Electronically Signed   By: Primitivo Gauze M.D.   On: 05/29/2020 14:49   MR Cervical Spine W Wo Contrast  Result Date: 05/29/2020 CLINICAL DATA:  Neoplasm, staging. EXAM: MRI HEAD WITHOUT AND WITH CONTRAST MRI CERVICAL SPINE WITHOUT AND WITH CONTRAST MRI LUMBAR SPINE WITHOUT AND WITH CONTRAST TECHNIQUE: Multiplanar, multiecho pulse sequences of the brain and surrounding structures, and cervical spine, to include the craniocervical junction and cervicothoracic junction, were obtained without and with intravenous contrast. Multiplanar and multiecho pulse sequences of the lumbar spine were obtained without and with intravenous contrast. CONTRAST:  43mL GADAVIST GADOBUTROL 1 MMOL/ML IV SOLN COMPARISON:  05/02/2020  lumbar spine radiographs. 05/22/2020 nuclear medicine PET scan. FINDINGS: MRI HEAD FINDINGS Brain: Scattered T2 hyperintense periventricular and subcortical white matter foci are nonspecific however commonly associated with chronic microvascular ischemic changes. Mild cerebral atrophy with ex vacuo dilatation. No acute infarct or intracranial hemorrhage. Tiny left temporal remote microhemorrhage. Sequela of remote left thalamic and right midbrain insults. No midline shift, ventriculomegaly or extra-axial fluid collection. Normal appearance of the sella turcica and pituitary gland. -3 mm inferior right cerebellar enhancing focus (18:14). -punctate medial left cerebellar enhancement (18:34). Vascular: Major intracranial flow voids are preserved. Skull and upper cervical spine: 2 mm right parietal enhancing focus (18:120). Scattered enhancing  foci measuring up to 4 mm within the basilar and anterolateral occipital bone (18:24). Sinuses/Orbits: Normal orbits. Minimal ethmoid and left maxillary sinus mucosal thickening. Trace right mastoid effusion. Other: 6 mm nonenhancing diffusion restricting right occipital scalp nodule (5:16), likely an epidermal inclusion cyst. MRI CERVICAL SPINE FINDINGS Alignment: Normal. Vertebrae: Diffusely T1 hypointense appearance of the bone marrow. Vertebral body heights are preserved. Bone marrow edema involving the left C4-5 and C5-6 facet joints. Cord: Normal signal intensity morphology.  No abnormal enhancement. Posterior Fossa, vertebral arteries: Please see MRI brain. Disc levels: Multilevel desiccation.  Mild C5-6 disc space loss. C2-3: No significant disc bulge, spinal canal or neural foraminal narrowing. C3-4: Small central protrusion with uncovertebral and facet hypertrophy. Patent spinal canal and left neural foramen. Mild right neural foraminal narrowing. C4-5: There is ill-defined enhancing soft tissue encasing the left C4-5 facet joint measuring 2.0 x 1.6 cm (12:16). The soft tissue extends into the left neural and transverse foramen, encasing the left vertebral artery (12:15). Left predominant and dorsal C5 vertebral body erosions. Prevertebral soft tissue thickening and enhancement is concerning for extension of tumor. There is also ill-defined enhancement along the right anterolateral C5 vertebral body (12:15). Inflammatory changes are seen within the left paraspinal region (11:13). Circumferential soft tissue thickening and enhancement involving the epidural space at the C4-5 level. Small disc osteophyte complex with uncovertebral and right facet hypertrophy. Mild spinal canal narrowing. Patent right neural foramen. Moderate left neural foraminal narrowing secondary to soft tissue encroachment. C5-6: There is inferior extension of ill-defined soft tissue along the left transverse/neural foramen and anterior  left facet joint (12:12) left facet joint bone marrow edema. Prominence of mild enhancement involving the epidural space. Disc osteophyte complex with uncovertebral and facet hypertrophy. Mild spinal canal and right neural foraminal narrowing. Soft tissue encroachment narrows the left neural foramen. C6-7: Disc osteophyte complex with uncovertebral and facet hypertrophy. Patent spinal canal and bilateral neural foramina. C7-T1: Disc osteophyte complex with uncovertebral and facet hypertrophy. Patent spinal canal and neural foramen. Paraspinal tissues: As detailed above. MRI LUMBAR SPINE FINDINGS Segmentation:  Standard. Alignment:  Normal. Vertebrae: Diffuse bone marrow heterogeneity. Multilevel osteophytosis. Metastases are detailed below. Conus medullaris and cauda equina: Conus extends to the L2 level. Conus and cauda equina appear normal. Filar lipoma. Disc levels: Multilevel desiccation and disc space loss most prominent at the L5-S1 level. L1-2: No significant disc bulge, spinal canal or neural foraminal narrowing. L2-3: Disc bulge, ligamentum flavum and bilateral facet hypertrophy. Mild spinal canal and bilateral neural foraminal narrowing. L3-4: L3 superior endplate pathologic fracture deformity with approximately 10-20% height loss. 4.7 x 3.4 cm enhancing metastatic lesion involving the right L3 vertebral body and posterior elements (13:19). There is extension of enhancing tumor into the prevertebral and anterior epidural spaces at the L3 level (13:20, 8:19) effacing the lateral recesses and abutting the  exiting bilateral L3 nerve roots. Ligamentum flavum and bilateral facet hypertrophy. Mild spinal canal and bilateral neural foraminal narrowing. L4-5: Disc bulge abutting the exiting left L4 nerve root, ligamentum flavum and bilateral facet hypertrophy. Mild spinal canal and moderate bilateral neural foraminal narrowing. Abnormal right L4-5 neural foramen soft tissue is discussed below. L5-S1: Disc bulge  with superimposed central protrusion abutting the descending bilateral S1 nerve roots, ligamentum flavum and bilateral facet hypertrophy. Patent spinal canal. Mild bilateral neural foraminal narrowing. Paraspinal and other soft tissues: At the level of the lumbosacral junction and extending along the right sacroiliac joint there is enhancing soft tissue measuring 4.2 x 2.6 cm (13:34). Soft tissue extends into the right L4-5 neural foramen (8:31) there are adjacent erosions involving the right lateral L5 vertebral body and superior right sacral ala. Anterior cortical defect is concerning for an associated pathologic fracture (8:36). IMPRESSION: MRI brain: -Sub-5 mm bilateral cerebellar enhancing foci are concerning for metastases. -Sub-5 mm right parietal bone and basilar occipital enhancing foci, concerning for osseous metastases. -Sequela of remote left thalamic and right midbrain insults. Mild cerebral atrophy and chronic microvascular ischemic changes. MRI cervical spine: -Metastatic soft tissue involving the left C5-6 facet joint and right anterolateral C5 vertebral body. There is extension of tumor into the prevertebral and circumferential epidural space at the C5-6 level without significant spinal canal narrowing. -Tumor also extends to involve the left C5-6 and C6-7 transverse and neural foramen with partial encasement of the vertebral artery. -No evidence of spinal cord enhancement. MRI lumbar spine: - L3 metastatic lesion and pathologic fracture with approximately 20% height loss. There is extension of tumor into the prevertebral and anterior epidural space at the L3 level with mild spinal canal and bilateral neural foraminal narrowing. - Partially imaged 4.2 cm right lumbosacral junction/SI joint metastasis with extension of tumor into the right L4-5 neural foramen. Query associated pathologic fracture. - Please see concurrent MR sacrum for better characterization. These results will be called to the  ordering clinician or representative by the Radiologist Assistant, and communication documented in the PACS or [employer]. Electronically Signed   By: Primitivo Gauze M.D.   On: 05/29/2020 14:49   MR Lumbar Spine W Wo Contrast  Result Date: 05/29/2020 CLINICAL DATA:  Neoplasm, staging. EXAM: MRI HEAD WITHOUT AND WITH CONTRAST MRI CERVICAL SPINE WITHOUT AND WITH CONTRAST MRI LUMBAR SPINE WITHOUT AND WITH CONTRAST TECHNIQUE: Multiplanar, multiecho pulse sequences of the brain and surrounding structures, and cervical spine, to include the craniocervical junction and cervicothoracic junction, were obtained without and with intravenous contrast. Multiplanar and multiecho pulse sequences of the lumbar spine were obtained without and with intravenous contrast. CONTRAST:  86mL GADAVIST GADOBUTROL 1 MMOL/ML IV SOLN COMPARISON:  05/02/2020 lumbar spine radiographs. 05/22/2020 nuclear medicine PET scan. FINDINGS: MRI HEAD FINDINGS Brain: Scattered T2 hyperintense periventricular and subcortical white matter foci are nonspecific however commonly associated with chronic microvascular ischemic changes. Mild cerebral atrophy with ex vacuo dilatation. No acute infarct or intracranial hemorrhage. Tiny left temporal remote microhemorrhage. Sequela of remote left thalamic and right midbrain insults. No midline shift, ventriculomegaly or extra-axial fluid collection. Normal appearance of the sella turcica and pituitary gland. -3 mm inferior right cerebellar enhancing focus (18:14). -punctate medial left cerebellar enhancement (18:34). Vascular: Major intracranial flow voids are preserved. Skull and upper cervical spine: 2 mm right parietal enhancing focus (18:120). Scattered enhancing foci measuring up to 4 mm within the basilar and anterolateral occipital bone (18:24). Sinuses/Orbits: Normal orbits. Minimal ethmoid and left maxillary  sinus mucosal thickening. Trace right mastoid effusion. Other: 6 mm nonenhancing  diffusion restricting right occipital scalp nodule (5:16), likely an epidermal inclusion cyst. MRI CERVICAL SPINE FINDINGS Alignment: Normal. Vertebrae: Diffusely T1 hypointense appearance of the bone marrow. Vertebral body heights are preserved. Bone marrow edema involving the left C4-5 and C5-6 facet joints. Cord: Normal signal intensity morphology.  No abnormal enhancement. Posterior Fossa, vertebral arteries: Please see MRI brain. Disc levels: Multilevel desiccation.  Mild C5-6 disc space loss. C2-3: No significant disc bulge, spinal canal or neural foraminal narrowing. C3-4: Small central protrusion with uncovertebral and facet hypertrophy. Patent spinal canal and left neural foramen. Mild right neural foraminal narrowing. C4-5: There is ill-defined enhancing soft tissue encasing the left C4-5 facet joint measuring 2.0 x 1.6 cm (12:16). The soft tissue extends into the left neural and transverse foramen, encasing the left vertebral artery (12:15). Left predominant and dorsal C5 vertebral body erosions. Prevertebral soft tissue thickening and enhancement is concerning for extension of tumor. There is also ill-defined enhancement along the right anterolateral C5 vertebral body (12:15). Inflammatory changes are seen within the left paraspinal region (11:13). Circumferential soft tissue thickening and enhancement involving the epidural space at the C4-5 level. Small disc osteophyte complex with uncovertebral and right facet hypertrophy. Mild spinal canal narrowing. Patent right neural foramen. Moderate left neural foraminal narrowing secondary to soft tissue encroachment. C5-6: There is inferior extension of ill-defined soft tissue along the left transverse/neural foramen and anterior left facet joint (12:12) left facet joint bone marrow edema. Prominence of mild enhancement involving the epidural space. Disc osteophyte complex with uncovertebral and facet hypertrophy. Mild spinal canal and right neural foraminal  narrowing. Soft tissue encroachment narrows the left neural foramen. C6-7: Disc osteophyte complex with uncovertebral and facet hypertrophy. Patent spinal canal and bilateral neural foramina. C7-T1: Disc osteophyte complex with uncovertebral and facet hypertrophy. Patent spinal canal and neural foramen. Paraspinal tissues: As detailed above. MRI LUMBAR SPINE FINDINGS Segmentation:  Standard. Alignment:  Normal. Vertebrae: Diffuse bone marrow heterogeneity. Multilevel osteophytosis. Metastases are detailed below. Conus medullaris and cauda equina: Conus extends to the L2 level. Conus and cauda equina appear normal. Filar lipoma. Disc levels: Multilevel desiccation and disc space loss most prominent at the L5-S1 level. L1-2: No significant disc bulge, spinal canal or neural foraminal narrowing. L2-3: Disc bulge, ligamentum flavum and bilateral facet hypertrophy. Mild spinal canal and bilateral neural foraminal narrowing. L3-4: L3 superior endplate pathologic fracture deformity with approximately 10-20% height loss. 4.7 x 3.4 cm enhancing metastatic lesion involving the right L3 vertebral body and posterior elements (13:19). There is extension of enhancing tumor into the prevertebral and anterior epidural spaces at the L3 level (13:20, 8:19) effacing the lateral recesses and abutting the exiting bilateral L3 nerve roots. Ligamentum flavum and bilateral facet hypertrophy. Mild spinal canal and bilateral neural foraminal narrowing. L4-5: Disc bulge abutting the exiting left L4 nerve root, ligamentum flavum and bilateral facet hypertrophy. Mild spinal canal and moderate bilateral neural foraminal narrowing. Abnormal right L4-5 neural foramen soft tissue is discussed below. L5-S1: Disc bulge with superimposed central protrusion abutting the descending bilateral S1 nerve roots, ligamentum flavum and bilateral facet hypertrophy. Patent spinal canal. Mild bilateral neural foraminal narrowing. Paraspinal and other soft  tissues: At the level of the lumbosacral junction and extending along the right sacroiliac joint there is enhancing soft tissue measuring 4.2 x 2.6 cm (13:34). Soft tissue extends into the right L4-5 neural foramen (8:31) there are adjacent erosions involving the right lateral L5 vertebral  body and superior right sacral ala. Anterior cortical defect is concerning for an associated pathologic fracture (8:36). IMPRESSION: MRI brain: -Sub-5 mm bilateral cerebellar enhancing foci are concerning for metastases. -Sub-5 mm right parietal bone and basilar occipital enhancing foci, concerning for osseous metastases. -Sequela of remote left thalamic and right midbrain insults. Mild cerebral atrophy and chronic microvascular ischemic changes. MRI cervical spine: -Metastatic soft tissue involving the left C5-6 facet joint and right anterolateral C5 vertebral body. There is extension of tumor into the prevertebral and circumferential epidural space at the C5-6 level without significant spinal canal narrowing. -Tumor also extends to involve the left C5-6 and C6-7 transverse and neural foramen with partial encasement of the vertebral artery. -No evidence of spinal cord enhancement. MRI lumbar spine: - L3 metastatic lesion and pathologic fracture with approximately 20% height loss. There is extension of tumor into the prevertebral and anterior epidural space at the L3 level with mild spinal canal and bilateral neural foraminal narrowing. - Partially imaged 4.2 cm right lumbosacral junction/SI joint metastasis with extension of tumor into the right L4-5 neural foramen. Query associated pathologic fracture. - Please see concurrent MR sacrum for better characterization. These results will be called to the ordering clinician or representative by the Radiologist Assistant, and communication documented in the PACS or [employer]. Electronically Signed   By: Primitivo Gauze M.D.   On: 05/29/2020 14:49   MR SACRUM SI JOINTS W WO  CONTRAST  Result Date: 05/30/2020 CLINICAL DATA:  Patient with a history of metastatic lung carcinoma. Onset low back pain approximately 1 month ago. Hypermetabolic lesion in the right sacrum on PET scan 05/22/2020. EXAM: MRI SACRUM WITHOUT AND WITH CONTRAST TECHNIQUE: Multiplanar, multisequence MR imaging of the sacrum was performed both before and after administration of intravenous contrast. CONTRAST:  6 mL GADAVIST IV SOLN COMPARISON:  PET scan 05/22/2020. FINDINGS: Bones/Joint/Cartilage As seen on the prior PET scan, there is a destructive, enhancing mass lesion in the right sacrum measuring approximately 4.4 cm transverse by 2.5 cm AP by 4.7 cm craniocaudal. The right sacral component of the lesion extends into the inferior aspect of the right L5-S1 foramen and impinges on the right L5 root within and beyond the foramen. Abnormal signal and enhancement in the right ilium consistent with tumor immediately posterior to the soft tissue mass measures 3.1 cm transverse by 1.0 cm AP by 4.8 cm craniocaudal. Tumor does not appear to impact the right S1 root. Ligaments Intact. Muscles and Tendons Intact and normal in appearance. Soft tissues Imaged intrapelvic contents are unremarkable. IMPRESSION: Metastatic disease in the right sacrum and posterior right ilium results in impingement on the right L5 root in the periphery of the foramen and beyond the foramen. Electronically Signed   By: Inge Rise M.D.   On: 05/30/2020 09:27   US Carotid Bilateral  Result Date: 06/18/2020 CLINICAL DATA:  Acute cerebral infarcts. EXAM: BILATERAL CAROTID DUPLEX ULTRASOUND TECHNIQUE: Pearline Cables scale imaging, color Doppler and duplex ultrasound were performed of bilateral carotid and vertebral arteries in the neck. COMPARISON:  None. FINDINGS: Criteria: Quantification of carotid stenosis is based on velocity parameters that correlate the residual internal carotid diameter with NASCET-based stenosis levels, using the diameter of the  distal internal carotid lumen as the denominator for stenosis measurement. The following velocity measurements were obtained: RIGHT ICA:  133/51 cm/sec CCA:  10/17 cm/sec SYSTOLIC ICA/CCA RATIO:  1.4 ECA:  199 cm/sec LEFT ICA:  114/37 cm/sec CCA:  510/25 cm/sec SYSTOLIC ICA/CCA RATIO:  1.1 ECA:  168 cm/sec RIGHT CAROTID ARTERY: Mild to moderate calcified plaque at the level of the distal bulb and proximal right ICA. Estimated right ICA stenosis is 50-69%. RIGHT VERTEBRAL ARTERY: Antegrade flow with normal waveform and velocity. LEFT CAROTID ARTERY: Mild to moderate predominately calcified plaque at the level of the left carotid bulb and proximal left ICA. Estimated left ICA stenosis is less than 50% based on velocities. LEFT VERTEBRAL ARTERY: Antegrade flow with normal waveform and velocity. IMPRESSION: Mild to moderate calcified plaque at the level of bilateral carotid bulbs and proximal internal carotid arteries. Estimated right ICA stenosis is 50-69% and estimated left ICA stenosis is less than 50%. Electronically Signed   By: Aletta Edouard M.D.   On: 06/18/2020 09:25   NM PET Image Initial (PI) Skull Base To Thigh  Result Date: 05/22/2020 CLINICAL DATA:  Initial treatment strategy for lung nodule, adrenal mass. EXAM: NUCLEAR MEDICINE PET SKULL BASE TO THIGH TECHNIQUE: 7.8 mCi F-18 FDG was injected intravenously. Full-ring PET imaging was performed from the skull base to thigh after the radiotracer. CT data was obtained and used for attenuation correction and anatomic localization. Fasting blood glucose: 94 mg/dl COMPARISON:  CT chest 05/16/2020. FINDINGS: Mediastinal blood pool activity: SUV max 2.0 Liver activity: SUV max NA NECK: There is focal hypermetabolism in the sellar region, SUV max 8.4, without a definite CT correlate. No hypermetabolic lymph nodes. Incidental CT findings: None. CHEST: Spiculated nodule in the apical segment right upper lobe measures 1.9 x 2.2 cm with an SUV max of 10.9. There is  abnormal hypermetabolism associated with apical segmental bronchial wall thickening and mucoid impaction (3/79, 84 and 87), SUV max 3.8. Hypermetabolic right hilar lymph node measures 7 mm (3/95) with an SUV max of 4.0. Right paratracheal lymph node measures 5 mm (3/81) with an SUV max of 3.1. AP window lymph node measures 6 mm (3/81) with an SUV max 4.9. Focal left hilar metabolism has an SUV max of 3.3 and corresponds to a 5 mm lymph node, better seen on contrast infused CT chest 05/16/2020. New peribronchovascular ground-glass in the superior segment left lower lobe (3/97) has an SUV max 2.8. Mild mid esophageal long segment hypermetabolism, without a definite CT correlate. Incidental CT findings: Atherosclerotic calcification of the aorta and coronary arteries. Heart size normal. No pericardial or pleural effusion. Centrilobular and paraseptal emphysema. ABDOMEN/PELVIS: Mildly hyperattenuating right adrenal mass measures 1.5 x 2.5 cm with an SUV max 28.6. There is focal hypermetabolism in the left adrenal gland, SUV max 5.6, without a definite CT correlate. No abnormal hypermetabolism in the liver, spleen, pancreas or lymph nodes. Incidental CT findings: Liver, gallbladder, kidneys, spleen, pancreas, stomach and bowel are otherwise unremarkable. Atherosclerotic calcification of the aorta without aneurysm. Slight bladder wall thickening. Prostate is normal in size. SKELETON: There are multiple hypermetabolic lytic lesions in the cervical spine, right scapula, posterior left tenth rib, L3, right sacrum and right pubic bone. Index lesion in the right sacral ala measures 2.9 x 3.4 cm (3/194) with an SUV max of 41.3. Incidental CT findings: None. IMPRESSION: 1. 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 2. Focal  hypermetabolism in the sellar region, without a definite CT correlate. Consider MR brain without and with contrast in further evaluation, as clinically indicated. 3. New peribronchovascular ground-glass in the superior segment left lower lobe, likely infectious or inflammatory in etiology. 4.  Aortic atherosclerosis (ICD10-I70.0). Coronary artery calcification. 5.  Emphysema (ICD10-J43.9). Electronically Signed   By: Lorin Picket M.D.   On: 05/22/2020 11:11   CT Biopsy  Result Date: 05/27/2020 INDICATION: 64 year old with a suspicious right lung nodule and suspect metastatic lung disease. Patient has a lucent bone lesion in the right sacral ala. Tissue diagnosis is needed. EXAM: CT-GUIDED CORE BIOPSY OF RIGHT SACRAL BONE LESION Physician: Stephan Minister. Anselm Pancoast, MD MEDICATIONS: None. ANESTHESIA/SEDATION: Fentanyl 50 mcg IV; Versed 1.0 mg IV Moderate Sedation Time:  19 minutes The patient was continuously monitored during the procedure by the interventional radiology nurse under my direct supervision. COMPLICATIONS: None immediate. PROCEDURE: The procedure was explained to the patient. The risks and benefits of the procedure were discussed and the patient's questions were addressed. Informed consent was obtained from the patient. Time-out was performed. Patient was placed supine on CT scanner. CT images through the lower abdomen were obtained. Right side of the abdomen was prepped with chlorhexidine and sterile field was created. Skin was anesthetized with 1% lidocaine. Small skin incision was made. Using CT guidance, a 17 gauge coaxial needle was directed into the right sacral lesion. Needle was positioned along the lateral aspect of the lesion in order to avoid the nerve. Multiple core biopsies were obtained with an 18 gauge core device. Specimens placed in formalin. 17 gauge needle was removed without complication. Bandage placed over the puncture site. FINDINGS: Destructive lucent lesion involving the right sacral  ala. Biopsy needle was confirmed within the lesion. Core specimens obtained. IMPRESSION: CT-guided core biopsies of the right sacral bone lesion. Electronically Signed   By: Markus Daft M.D.   On: 05/27/2020 12:56   DG CHEST PORT 1 VIEW  Result Date: 06/11/2020 CLINICAL DATA:  COVID. EXAM: PORTABLE CHEST 1 VIEW COMPARISON:  06/08/2020.  PET-CT 05/22/2020. FINDINGS: Mediastinum and hilar structures are stable. Known spiculated nodule in the right upper best identified on prior PET-CT. No acute infiltrates. No pleural effusion or pneumothorax. Known bony lesions best identified by prior PET-CT. Carotid vascular calcification. IMPRESSION: 1. Known spiculated right upper lung nodule and multiple bony lesions best identified by prior PET-CT. 2.  No acute pulmonary disease.  Chest is stable from prior exam. 3.  Carotid vascular disease. Electronically Signed   By: Marcello Moores  Register   On: 06/11/2020 06:11   DG Chest Port 1 View  Result Date: 06/08/2020 CLINICAL DATA:  Dyspnea. Recent diagnosis of lung cancer. Family positive for COVID-19. EXAM: PORTABLE CHEST 1 VIEW COMPARISON:  Radiograph and CT 05/16/2020.  PET CT 05/22/2020. FINDINGS: The lungs are hyperinflated. Stable heart size and mediastinal contours. No convincing acute airspace disease. The known spiculated nodule at the right lung apex is only faintly visualized. No pneumothorax or large pleural effusion. Known osseous metastasis on PET not well seen by radiograph. IMPRESSION: 1. No acute findings. 2. Chronic hyperinflation. The known spiculated nodule at the right lung apex is only faintly visualized radiographically. Electronically Signed   By: Keith Rake M.D.   On: 06/08/2020 18:43   ECHOCARDIOGRAM COMPLETE  Result Date: 06/19/2020    ECHOCARDIOGRAM REPORT   Patient Name:   Douglas [name] Date of Exam: 06/18/2020 Medical Rec #:  [id number]        Height:       65.0 in Accession #:    [id number]       Weight:       150.1 lb Date of Birth:   1956-08-17  BSA:          1.751 m Patient Age:    28 years         BP:           150/99 mmHg Patient Gender: M                HR:           61 bpm. Exam Location:  [facility] Procedure: 2D Echo, Color Doppler and Cardiac Doppler Indications:     I163.9 Stroke  History:         Patient has no prior history of Echocardiogram examinations.                  COPD; Risk Factors:Hypertension and Dyslipidemia. Pt tested                  positive for COVID-19 on 06/08/20.  Sonographer:     Charmayne Sheer RDCS (AE) Referring Phys:  [id number] Loletha Grayer Diagnosing Phys: Serafina Royals MD  Sonographer Comments: Technically difficult study due to poor echo windows. Image acquisition challenging due to uncooperative patient and Image acquisition challenging due to COPD. IMPRESSIONS  1. Left ventricular ejection fraction, by estimation, is 65 to 70%. The left ventricle has normal function. The left ventricle has no regional wall motion abnormalities. Left ventricular diastolic parameters were normal.  2. Right ventricular systolic function is normal. The right ventricular size is normal.  3. The mitral valve is normal in structure. Trivial mitral valve regurgitation.  4. The aortic valve is normal in structure. Aortic valve regurgitation is not visualized. FINDINGS  Left Ventricle: Left ventricular ejection fraction, by estimation, is 65 to 70%. The left ventricle has normal function. The left ventricle has no regional wall motion abnormalities. The left ventricular internal cavity size was small. There is no left ventricular hypertrophy. Left ventricular diastolic parameters were normal. Right Ventricle: The right ventricular size is normal. No increase in right ventricular wall thickness. Right ventricular systolic function is normal. Left Atrium: Left atrial size was normal in size. Right Atrium: Right atrial size was normal in size. Pericardium: There is no evidence of pericardial effusion. Mitral Valve: The mitral valve is  normal in structure. Trivial mitral valve regurgitation. MV peak gradient, 2.0 mmHg. The mean mitral valve gradient is 1.0 mmHg. Tricuspid Valve: The tricuspid valve is normal in structure. Tricuspid valve regurgitation is trivial. Aortic Valve: The aortic valve is normal in structure. Aortic valve regurgitation is not visualized. Aortic valve mean gradient measures 1.0 mmHg. Aortic valve peak gradient measures 2.7 mmHg. Aortic valve area, by VTI measures 3.10 cm. Pulmonic Valve: The pulmonic valve was normal in structure. Pulmonic valve regurgitation is trivial. Aorta: The aortic root and ascending aorta are structurally normal, with no evidence of dilitation. IAS/Shunts: No atrial level shunt detected by color flow Doppler.  LEFT VENTRICLE PLAX 2D LVOT diam:     2.20 cm  Diastology LV SV:         49       LV e' lateral:   7.51 cm/s LV SV Index:   28       LV E/e' lateral: 6.3 LVOT Area:     3.80 cm LV e' medial:    6.96 cm/s                         LV E/e' medial:  6.8  AORTIC VALVE  PULMONIC VALVE AV Area (Vmax):    3.10 cm    PV Vmax:       1.08 m/s AV Area (Vmean):   3.65 cm    PV Vmean:      75.400 cm/s AV Area (VTI):     3.10 cm    PV VTI:        0.190 m AV Vmax:           82.50 cm/s  PV Peak grad:  4.7 mmHg AV Vmean:          51.700 cm/s PV Mean grad:  3.0 mmHg AV VTI:            0.158 m AV Peak Grad:      2.7 mmHg AV Mean Grad:      1.0 mmHg LVOT Vmax:         67.20 cm/s LVOT Vmean:        49.600 cm/s LVOT VTI:          0.129 m LVOT/AV VTI ratio: 0.82  AORTA Ao Root diam: 3.50 cm MITRAL VALVE MV Area (PHT): 2.32 cm    SHUNTS MV Peak grad:  2.0 mmHg    Systemic VTI:  0.13 m MV Mean grad:  1.0 mmHg    Systemic Diam: 2.20 cm MV Vmax:       0.71 m/s MV Vmean:      39.4 cm/s MV Decel Time: 327 msec MV E velocity: 47.10 cm/s MV A velocity: 55.30 cm/s MV E/A ratio:  0.85 Serafina Royals MD Electronically signed by Serafina Royals MD Signature Date/Time: 06/19/2020/12:14:55 PM    Final      PERFORMANCE STATUS (ECOG) : 3 - Symptomatic, >50% confined to bed  Review of Systems Unless otherwise noted, a complete review of systems is negative.  Physical Exam General: NAD, thin, frail appearing Pulmonary: unlabored, on O2 Extremities: no edema, no joint deformities Skin: no rashes Neurological: R. Sided Weakness, confused  IMPRESSION: Patient seems a little less confused and is able to answer some simple questions.  Staff report that he has been agitated overnight.  He has continued to receive as needed Haldol.  Could consider rotating to scheduled olanzapine at bedtime.  He is currently calm and denies any symptomatic complaints.  No family present.  PLAN: -Continue current scope of treatment -Will follow    Time Total: 15 minutes  Visit consisted of counseling and education dealing with the complex and emotionally intense issues of symptom management and palliative care in the setting of serious and potentially life-threatening illness.Greater than 50%  of this time was spent counseling and coordinating care related to the above assessment and plan.  Signed by: Altha Harm, PhD, NP-C

## 2020-06-20 ENCOUNTER — Ambulatory Visit: Payer: Medicare Other

## 2020-06-20 MED ORDER — HYDROMORPHONE HCL 1 MG/ML IJ SOLN
1.0000 mg | INTRAMUSCULAR | Status: DC | PRN
  Administered 2020-06-20 – 2020-06-21 (×2): 1 mg via INTRAVENOUS
  Filled 2020-06-20 (×2): qty 1

## 2020-06-20 NOTE — Progress Notes (Signed)
Patient suffers from COPD on chronic O2 and metastaic lung cancer which impairs their ability to perform daily activities like walking, bathing, dressing and preparing meals in the home.  A walker will not resolve issue with performing activities of daily living. A wheelchair will allow patient to safely perform daily activities. Patient is not able to propel themselves in the home using a standard weight wheelchair due to weakness. Patient can self propel in the lightweight wheelchair.

## 2020-06-20 NOTE — Progress Notes (Signed)
[place] visited pt. at NT suggestion; pt. sitting up in bed.  Pt. asked for a Sprite --> CH passed this request along to RN staff, explaining to pt. he couldn't get him anything to drink w/o RN approval; pt. questioned chaplains' utility in hospital if not allowed to bring drinks.  Pt. seemed generally irritable and eager to leave hospital; said he used to be able to get up and walk before this illness and hospitalization; when asked if PT had been in to work with him, pt. said he'd seen no sign of them.  Pt. attempting to use bed remote and call putton to control TV; Watkins found TV remote and attempted to help pt. use it. At 1C RN station, [facility] consulted w/PT and learned that pt. has actually been seen by them.

## 2020-06-20 NOTE — Progress Notes (Signed)
I spoke with patient's stepdaughter by phone.  Hospice had been mentioned as an option but she would like to hold off on referral for now.  She plans to take him home and provide 24/7 support and would be interested in home health PT/OT.  She does not want SNF.  Daughter would be interested in pursuing systemic treatment if patient improves or stabilizes.  In the event that he declines, daughter would then pursue hospice.  Plan: -CSW to help with discharge planning -Recommend home health with palliative care following  Time Total: 15 minutes  Visit consisted of counseling and education dealing with the complex and emotionally intense issues of symptom management and palliative care in the setting of serious and potentially life-threatening illness.Greater than 50%  of this time was spent counseling and coordinating care related to the above assessment and plan.  Signed by: Altha Harm, PhD, NP-C

## 2020-06-20 NOTE — Progress Notes (Signed)
PROGRESS NOTE    Douglas [name]  MPN:[id number] DOB: [birth year] DOA: 06/17/2020 PCP: Patient, No Pcp Per   Brief Narrative: Taken from H&P Douglas [name] is a 64 y.o. male with medical history significant for severe COPD, chronic hypoxic respiratory failure initially on 2 L continuous nasal cannula but following a recent hospitalization for Covid pneumonia, he was discharged on 5 L of oxygen, hypertension, recently diagnosed lung cancer with brain mets,who was recently discharged from [facility] on 06/12/2020.  He was brought to the ER by his daughter for evaluation of mental status changes which she said has progressively worsened since his discharge from the [name].  Subjective: Complaining of back pain and asking for help.Refused to answer any orientation questions before taking care of this pain.  Assessment & Plan:   Principal Problem:   AMS (altered mental status) Active Problems:   Hypertension   COPD (chronic obstructive pulmonary disease) ([place])   COVID-19 virus infection   Primary malignant neoplasm of lung with metastasis to brain [name])   Chronic respiratory failure with hypoxia, on home O2 therapy (HCC)   Leukocytosis   Stroke (HCC)  Left MCA infarct.  Patient came with altered mental status, initial CT head was negative but subsequent MRI shows left MCA territory infarct.  Neurology was consulted and patient underwent stroke work-up. Neurology is okay with aspirin but Lipitor was discontinued due to decreased life expectancy secondary to metastatic lung disease. PT is recommending rehab. Daughter wants to take him back home with maximum home health services.  Acute on chronic hypoxic respiratory failure.  Patient with metastatic lung cancer.  Recent diagnosis.  Also has recent COVID-19 infection earlier in August and completed the treatment at [place] [name]. -Continue supplemental oxygen.  Recent COVID-19 pneumonia.  Patient completed COVID-19  treatment at [name].  He was diagnosed on 06/08/2020, discharged on 06/12/2020.  Inflammatory markers within normal limit. Initially started on high-dose Decadron for concern of cerebral edema which was discontinued by oncology. Patient should be out of quarantine by 06/23/2020.  Metastatic lung cancer/cancer pain. patient recently diagnosed with metastatic lung cancer with mets to brain and bones.  Oncology was consulted and according to their note he is not a candidate for any chemotherapy due to poor functional status. He was evaluated for palliative radiation which has not been started yet. Palliative care was consulted-appreciate their help in his case. After discussing with palliative and his daughter, she wants to take him back home with maximum home health services till he received palliative radiation.  She will monitor him for next few weeks if continue to deteriorate she was interested in proceeding to hospice care with complete comfort measures. Discussed with his oncologist and Vonna Kotyk from palliative care at [facility], who will arrange outpatient palliative care at this time and will monitor the patient whether to transition to hospice/start him on chemotherapy if there is some improvement in his functional status. -Continue with pain management, which include OxyContin and oxycodone. -Add Dilaudid as needed.  Essential hypertension.  Blood pressure elevated. -Continue losartan and HCTZ.  Objective: Vitals:   06/19/20 2011 06/20/20 0014 06/20/20 0408 06/20/20 0751  BP: (!) 178/102 (!) 148/91 (!) 145/79 (!) 154/85  Pulse: 72 79 77 90  Resp: 20 15 20 20   Temp: 98.9 F (37.2 C) 98 F (36.7 C) 99.1 F (37.3 C) 98.3 F (36.8 C)  TempSrc: Oral  Oral Oral  SpO2: 100% 94% 99% 96%  Weight:  Height:        Intake/Output Summary (Last 24 hours) at 06/20/2020 0844 Last data filed at 06/20/2020 0600 Gross per 24 hour  Intake 480 ml  Output 1500 ml  Net -1020 ml    Filed Weights   06/17/20 1035  Weight: 68.1 kg    Examination:  General.  Chronically ill-appearing, frail gentleman, in no acute distress. Pulmonary.  Lungs clear bilaterally, normal respiratory effort. CV.  Regular rate and rhythm, no JVD, rub or murmur. Abdomen.  Soft, nontender, nondistended, BS positive. CNS.  Alert and oriented x3.  No focal neurologic deficit. Extremities.  No edema, no cyanosis, pulses intact and symmetrical. Psychiatry.  Judgment and insight appears impaired.  DVT prophylaxis: Lovenox Code Status: DNR Family Communication: Daughter was updated on phone. She is interested in taking him back home with outpatient palliative with a possible transition to hospice after getting some palliative radiation for his cancer pain secondary to osseous mets . Disposition Plan:  Status is: Inpatient  Remains inpatient appropriate because:Inpatient level of care appropriate due to severity of illness   Dispo: The patient is from: Home              Anticipated d/c is to: To be determined.              Anticipated d/c date is: 1 day.              Patient currently is not medically stable to d/c. Daughter is interested in taking him back home with palliative/hospice care.  Home health services and a batch of equipments ordered.  Anticipating discharge home once [name] bed and everything is set up at home.  Consultants:   Cardiology  Neurology  Oncology  Procedures:  Antimicrobials:   Data Reviewed: I have personally reviewed following labs and imaging studies  CBC: Recent Labs  Lab 06/17/20 1040 06/18/20 0523  WBC 16.0* 8.9  NEUTROABS 13.3*  --   HGB 10.7* 10.0*  HCT 32.9* 30.9*  MCV 91.4 91.7  PLT 461* 462*   Basic Metabolic Panel: Recent Labs  Lab 06/17/20 1040 06/18/20 0523  NA 139 140  K 3.8 4.4  CL 94* 98  CO2 33* 31  GLUCOSE 98 118*  BUN 15 15  CREATININE 1.05 0.95  CALCIUM 8.9 8.8*  MG 2.3  --    GFR: Estimated Creatinine  Clearance: 67.4 mL/min (by C-G formula based on SCr of 0.95 mg/dL). Liver Function Tests: Recent Labs  Lab 06/17/20 1040  AST 23  ALT 24  ALKPHOS 68  BILITOT 0.8  PROT 6.9  ALBUMIN 3.3*   No results for input(s): LIPASE, AMYLASE in the last 168 hours. Recent Labs  Lab 06/19/20 0506  AMMONIA 18   Coagulation Profile: Recent Labs  Lab 06/17/20 1040  INR 1.0   Cardiac Enzymes: No results for input(s): CKTOTAL, CKMB, CKMBINDEX, TROPONINI in the last 168 hours. BNP (last 3 results) No results for input(s): PROBNP in the last 8760 hours. HbA1C: No results for input(s): HGBA1C in the last 72 hours. CBG: No results for input(s): GLUCAP in the last 168 hours. Lipid Profile: Recent Labs    06/18/20 0523  CHOL 124  HDL 58  LDLCALC 41  TRIG 126  CHOLHDL 2.1   Thyroid Function Tests: Recent Labs    06/17/20 1040  TSH 1.628   Anemia Panel: No results for input(s): VITAMINB12, FOLATE, FERRITIN, TIBC, IRON, RETICCTPCT in the last 72 hours. Sepsis Labs: Recent Labs  Lab 06/17/20 1040  PROCALCITON <0.10    No results found for this or any previous visit (from the past 240 hour(s)).   Radiology Studies: US Carotid Bilateral  Result Date: 06/18/2020 CLINICAL DATA:  Acute cerebral infarcts. EXAM: BILATERAL CAROTID DUPLEX ULTRASOUND TECHNIQUE: Pearline Cables scale imaging, color Doppler and duplex ultrasound were performed of bilateral carotid and vertebral arteries in the neck. COMPARISON:  None. FINDINGS: Criteria: Quantification of carotid stenosis is based on velocity parameters that correlate the residual internal carotid diameter with NASCET-based stenosis levels, using the diameter of the distal internal carotid lumen as the denominator for stenosis measurement. The following velocity measurements were obtained: RIGHT ICA:  133/51 cm/sec CCA:  84/69 cm/sec SYSTOLIC ICA/CCA RATIO:  1.4 ECA:  199 cm/sec LEFT ICA:  114/37 cm/sec CCA:  629/52 cm/sec SYSTOLIC ICA/CCA RATIO:  1.1 ECA:   168 cm/sec RIGHT CAROTID ARTERY: Mild to moderate calcified plaque at the level of the distal bulb and proximal right ICA. Estimated right ICA stenosis is 50-69%. RIGHT VERTEBRAL ARTERY: Antegrade flow with normal waveform and velocity. LEFT CAROTID ARTERY: Mild to moderate predominately calcified plaque at the level of the left carotid bulb and proximal left ICA. Estimated left ICA stenosis is less than 50% based on velocities. LEFT VERTEBRAL ARTERY: Antegrade flow with normal waveform and velocity. IMPRESSION: Mild to moderate calcified plaque at the level of bilateral carotid bulbs and proximal internal carotid arteries. Estimated right ICA stenosis is 50-69% and estimated left ICA stenosis is less than 50%. Electronically Signed   By: Aletta Edouard M.D.   On: 06/18/2020 09:25   ECHOCARDIOGRAM COMPLETE  Result Date: 06/19/2020    ECHOCARDIOGRAM REPORT   Patient Name:   Douglas [name] Date of Exam: 06/18/2020 Medical Rec #:  [id number]        Height:       65.0 in Accession #:    [id number]       Weight:       150.1 lb Date of Birth:  [birth year]        BSA:          1.751 m Patient Age:    68 years         BP:           150/99 mmHg Patient Gender: M                HR:           61 bpm. Exam Location:  [facility] Procedure: 2D Echo, Color Doppler and Cardiac Doppler Indications:     I163.9 Stroke  History:         Patient has no prior history of Echocardiogram examinations.                  COPD; Risk Factors:Hypertension and Dyslipidemia. Pt tested                  positive for COVID-19 on 06/08/20.  Sonographer:     Charmayne Sheer RDCS (AE) Referring Phys:  [id number] Loletha Grayer Diagnosing Phys: Serafina Royals MD  Sonographer Comments: Technically difficult study due to poor echo windows. Image acquisition challenging due to uncooperative patient and Image acquisition challenging due to COPD. IMPRESSIONS  1. Left ventricular ejection fraction, by estimation, is 65 to 70%. The left ventricle has normal function. The  left ventricle has no regional wall motion abnormalities. Left ventricular diastolic parameters were normal.  2. Right ventricular systolic function is normal. The right ventricular size is normal.  3. The  mitral valve is normal in structure. Trivial mitral valve regurgitation.  4. The aortic valve is normal in structure. Aortic valve regurgitation is not visualized. FINDINGS  Left Ventricle: Left ventricular ejection fraction, by estimation, is 65 to 70%. The left ventricle has normal function. The left ventricle has no regional wall motion abnormalities. The left ventricular internal cavity size was small. There is no left ventricular hypertrophy. Left ventricular diastolic parameters were normal. Right Ventricle: The right ventricular size is normal. No increase in right ventricular wall thickness. Right ventricular systolic function is normal. Left Atrium: Left atrial size was normal in size. Right Atrium: Right atrial size was normal in size. Pericardium: There is no evidence of pericardial effusion. Mitral Valve: The mitral valve is normal in structure. Trivial mitral valve regurgitation. MV peak gradient, 2.0 mmHg. The mean mitral valve gradient is 1.0 mmHg. Tricuspid Valve: The tricuspid valve is normal in structure. Tricuspid valve regurgitation is trivial. Aortic Valve: The aortic valve is normal in structure. Aortic valve regurgitation is not visualized. Aortic valve mean gradient measures 1.0 mmHg. Aortic valve peak gradient measures 2.7 mmHg. Aortic valve area, by VTI measures 3.10 cm. Pulmonic Valve: The pulmonic valve was normal in structure. Pulmonic valve regurgitation is trivial. Aorta: The aortic root and ascending aorta are structurally normal, with no evidence of dilitation. IAS/Shunts: No atrial level shunt detected by color flow Doppler.  LEFT VENTRICLE PLAX 2D LVOT diam:     2.20 cm  Diastology LV SV:         49       LV e' lateral:   7.51 cm/s LV SV Index:   28       LV E/e' lateral: 6.3  LVOT Area:     3.80 cm LV e' medial:    6.96 cm/s                         LV E/e' medial:  6.8  AORTIC VALVE                   PULMONIC VALVE AV Area (Vmax):    3.10 cm    PV Vmax:       1.08 m/s AV Area (Vmean):   3.65 cm    PV Vmean:      75.400 cm/s AV Area (VTI):     3.10 cm    PV VTI:        0.190 m AV Vmax:           82.50 cm/s  PV Peak grad:  4.7 mmHg AV Vmean:          51.700 cm/s PV Mean grad:  3.0 mmHg AV VTI:            0.158 m AV Peak Grad:      2.7 mmHg AV Mean Grad:      1.0 mmHg LVOT Vmax:         67.20 cm/s LVOT Vmean:        49.600 cm/s LVOT VTI:          0.129 m LVOT/AV VTI ratio: 0.82  AORTA Ao Root diam: 3.50 cm MITRAL VALVE MV Area (PHT): 2.32 cm    SHUNTS MV Peak grad:  2.0 mmHg    Systemic VTI:  0.13 m MV Mean grad:  1.0 mmHg    Systemic Diam: 2.20 cm MV Vmax:       0.71 m/s MV Vmean:  39.4 cm/s MV Decel Time: 327 msec MV E velocity: 47.10 cm/s MV A velocity: 55.30 cm/s MV E/A ratio:  0.85 Serafina Royals MD Electronically signed by Serafina Royals MD Signature Date/Time: 06/19/2020/12:14:55 PM    Final     Scheduled Meds:  aspirin EC  325 mg Oral Daily   atorvastatin  40 mg Oral QPM   enoxaparin (LOVENOX) injection  40 mg Subcutaneous Q24H   gabapentin  300 mg Oral BID   losartan  50 mg Oral Daily   And   hydrochlorothiazide  12.5 mg Oral Daily   mometasone-formoterol  2 puff Inhalation BID   And   umeclidinium bromide  1 puff Inhalation Daily   oxyCODONE  10 mg Oral Q8H   pantoprazole  40 mg Oral Daily   sodium chloride flush  3 mL Intravenous Once   sodium chloride flush  3 mL Intravenous Q12H   sucralfate  1 g Oral TID WC & HS   traZODone  50 mg Oral QHS   Continuous Infusions:  sodium chloride       LOS: 3 days   Time spent: 35 minutes.  Lorella Nimrod, MD [facility]  If 7PM-7AM, please contact night-coverage [email or web address]  06/20/2020, 8:44 AM   This record has been created using Systems analyst. Errors have  been sought and corrected,but may not always be located. Such creation errors do not reflect on the standard of care.

## 2020-06-20 NOTE — Progress Notes (Signed)
[place] [facility]) Hospital Liaison RN note:  Received request from Altha Harm, NP for out patient palliative care with [facility] to follow after discharge. Information provided to palliative admin. Doran Clay, TOC is aware. We will follow for disposition and update ACC at discharge.  Thank you for this referral.  Zandra Abts, RN [facility] Liaison [phone]

## 2020-06-20 NOTE — Progress Notes (Deleted)
Patient understands and received discharge instructions

## 2020-06-20 NOTE — TOC Initial Note (Signed)
Transition of Care [facility]) - Initial/Assessment Note    Patient Details  Name: Douglas [name] MRN: [id number] Date of Birth: [birth year]  Transition of Care [facility]) CM/SW Contact:    Shelbie Hutching, RN Phone Number: 06/20/2020, 3:42 PM  Clinical Narrative:                 Patient admitted to the hospital with altered mental status and metastatic lung cancer.  Patient is on chronic O2 2L through [employer].   Patient lives with his daughter and she will pick him up tomorrow.  Planned discharge for tomorrow with equipment and home health services.   [employer] will be able to provide equipment and deliver to the patient's home.  Previous referral was made to [facility] for home health services.  Tommi Rumps notified that patient is here in the hospital.  Patient will also be followed by outpatient palliative services.  Referral given to Zandra Abts with [facility] for OP Palliative services.  TOC will cont to follow.    Expected Discharge Plan: [place] Barriers to Discharge: Continued Medical Work up   Patient Goals and CMS Choice Patient states their goals for this hospitalization and ongoing recovery are:: Daughter would like for the patient to return home with home health and OP Palliative CMS Medicare.gov Compare Post Acute Care list provided to:: Patient Represenative (must comment) Choice offered to / list presented to : Adult Children  Expected Discharge Plan and Services Expected Discharge Plan: [place]   Discharge Planning Services: CM Consult Post Acute Care Choice: [place] arrangements for the past 2 months: Single Family Home                           HH Arranged: RN, PT, OT, Nurse's Aide HH Agency: [place] Date [facility] Agency Contacted: 06/20/20 Time [place]: 0109 Representative spoke with at [place]: [place] Arrangements/Services Living arrangements for the past 2 months: [place]  with:: Adult Children Haematologist) Patient language and need for interpreter reviewed:: Yes Do you feel safe going back to the place where you live?: Yes      Need for Family Participation in Patient Care: Yes (Comment) (cancer with mets) Care giver support system in place?: Yes (comment) (daughter) Current home services: DME (oxygen) Criminal Activity/Legal Involvement Pertinent to Current Situation/Hospitalization: No - Comment as needed  Activities of Daily Living      Permission Sought/Granted Permission sought to share information with : Case Manager, Family Supports, Other (comment) Permission granted to share information with : Yes, Verbal Permission Granted  Share Information with NAME: Sherri  Permission granted to share info w AGENCY: [facility]  Permission granted to share info w Relationship: daughter     Emotional Assessment Appearance:: Appears stated age Attitude/Demeanor/Rapport: Engaged Affect (typically observed): Accepting Orientation: : Oriented to Self, Oriented to Place, Oriented to Situation Alcohol / Substance Use: Tobacco Use Psych Involvement: No (comment)  Admission diagnosis:  Weakness [R53.1] Stroke [facility]) [I63.9] Acute on chronic respiratory failure with hypoxia (HCC) [J96.21] Altered mental status, unspecified altered mental status type [R41.82] Metastatic malignant neoplasm, unspecified site ([place]) [C79.9] Hypertension, unspecified type [I10] AMS (altered mental status) [R41.82] Patient Active Problem List   Diagnosis Date Noted  . Stroke ([place])   . Acute metabolic encephalopathy   . Other chronic pain   . Palliative care encounter   . AMS (altered  mental status) 06/17/2020  . Chronic respiratory failure with hypoxia, on home O2 therapy ([place]) 06/17/2020  . Leukocytosis 06/17/2020  . Primary malignant neoplasm of lung with metastasis to brain ([place]) 06/09/2020  . Acute on chronic respiratory failure with hypoxia ([place]) 06/08/2020  . COVID-19 virus  infection 06/08/2020  . Severe sepsis ([place]) 06/08/2020  . COPD with acute exacerbation ([place]) 06/08/2020  . SOB (shortness of breath) 06/08/2020  . Cancer-related pain 05/27/2020  . Weight loss 05/27/2020  . Dysphagia 05/27/2020  . Bone metastasis ([place]) 05/27/2020  . Lytic bone lesions on xray 05/23/2020  . Goals of care, counseling/discussion 05/23/2020  . 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 Esophagitis 05/16/2020  . Mass of upper lobe of right lung 05/16/2020  . Hypertension   . Hyperlipidemia   . COPD (chronic obstructive pulmonary disease) ([place])    PCP:  Patient, No Pcp Per Pharmacy:   [place], [place] - [address] [place] [address] Phone: [phone] Fax: [phone]     Social Determinants of Health (SDOH) Interventions    Readmission Risk Interventions No flowsheet data found.

## 2020-06-20 NOTE — Progress Notes (Signed)
Patient has COPD on chronic O2, and metastatic lung cancer which requires head of bed to be positioned in ways not feasible with a normal bed. Head must be elevated at least 30 degrees or patient becomes short of breath and cannot maintain oxygen saturations.  COPD and lung cancer requires frequent and immediate changes in body position which cannot be achieved with a normal bed.

## 2020-06-20 NOTE — TOC Progression Note (Signed)
Transition of Care [facility]) - Progression Note    Patient Details  Name: Douglas [name] MRN: [id number] Date of Birth: 1955/11/08  Transition of Care [facility]) CM/SW Contact  Shelbie Hutching, RN Phone Number: 06/20/2020, 5:23 PM  Clinical Narrative:    Plan for discharge tomorrow, [employer] has been given the referral for all equipment and daughter, Venida Jarvis, confirms that the home O2 was set up and delivered by [employer] in early Aug.  Sherri will be able to come and pick the patient up tomorrow.  Patient is open to [facility] for home health and Tommi Rumps with [facility] is aware of discharge plan for tomorrow, orders are in.     Expected Discharge Plan: [place] Barriers to Discharge: Continued Medical Work up  Expected Discharge Plan and Services Expected Discharge Plan: [place]   Discharge Planning Services: CM Consult Post Acute Care Choice: [place] arrangements for the past 2 months: Single Family Home                           HH Arranged: RN, PT, OT, Nurse's Aide [place] Agency: [place] Date [place]: 06/20/20 Time [place]: 2595 Representative spoke with at [place]: [place] ([place]) Interventions    Readmission Risk Interventions No flowsheet data found.

## 2020-06-21 ENCOUNTER — Telehealth: Payer: Self-pay | Admitting: *Deleted

## 2020-06-21 ENCOUNTER — Ambulatory Visit: Payer: Medicare Other

## 2020-06-21 DIAGNOSIS — C799 Secondary malignant neoplasm of unspecified site: Secondary | ICD-10-CM

## 2020-06-21 MED ORDER — ATORVASTATIN CALCIUM 40 MG PO TABS: 40.0000 mg | ORAL_TABLET | Freq: Every evening | ORAL | 1 refills | Status: AC

## 2020-06-21 MED ORDER — ASPIRIN 325 MG PO TBEC: 325.0000 mg | DELAYED_RELEASE_TABLET | Freq: Every day | ORAL | 0 refills | Status: AC

## 2020-06-21 NOTE — Discharge Summary (Signed)
Physician Discharge Summary  Douglas [name] HKV:[id number] DOB: [birth year] DOA: 06/17/2020  PCP: Patient, No Pcp Per  Admit date: 06/17/2020 Discharge date: 06/21/2020  Admitted From: Home Disposition:  Home  Recommendations for Outpatient Follow-up:  1. Follow up with PCP in 1-2 weeks 2. Please obtain BMP/CBC in one week 3. Please follow up on the following pending results:None  Home Health:Yes Equipment/Devices: Home oxygen, hospital bed, bedside commode, wheelchair. Discharge Condition: Fair,  Guarded CODE STATUS: DNR Diet recommendation: Heart Healthy   Brief/Interim Summary: Douglas [name] a 64 y.o.malewith medical history significant forsevere COPD, chronic hypoxic respiratory failureinitiallyon 2 L continuous nasal cannulabutfollowing a recent hospitalization for Covid pneumonia, hewas discharged on 5 L of oxygen, hypertension, recently diagnosed lung cancerwith brain and osseous mets,whowas recently discharged [facility] on 06/12/2020.He was brought to the ER by his daughter for evaluation of mental status changes which she said has progressively worsened since his discharge from the hospital. Found to have left MCA infarct.  Did have mild weakness on right upper and lower extremity.  Neurology was consulted and he did underwent stroke work-up.  He was started on aspirin and Lipitor, although due to his limited life expectancy that might not be very useful.  Oncology was consulted for his recent diagnosis of metastatic lung cancer with history of chronic severe COPD.  Continue to need higher level of oxygen at 6 to 7 L.  Baseline of 2 L.  Recent Covid infection also complicates his prognosis.  He is not a candidate for chemotherapy due to poor functional status.  They are advised to continue with palliative radiation to help with his cancer pain secondary to bony mets.  They will arrange radiation as an outpatient.  Palliative care was also  consulted.  Patient's daughter was involved in his care and would like to take him back home.  [facility] and he is being discharged home with outpatient palliative care.  He will undergo palliative radiation and the daughter will watch for couple of weeks related to his response.  If he improves then they might consider about chemotherapy.  If he continued to deteriorate daughter is aware that they will be transition to hospice care.  Discharge Diagnoses:  Principal Problem:   AMS (altered mental status) Active Problems:   Hypertension   COPD (chronic obstructive pulmonary disease) (HCC)   Metastatic malignant neoplasm ([place])   COVID-19 virus infection   Primary malignant neoplasm of lung with metastasis to brain ([place])   Chronic respiratory failure with hypoxia, on home O2 therapy (HCC)   Leukocytosis   Stroke [facility])   Discharge Instructions  Discharge Instructions    Diet - low sodium heart healthy   Complete by: As directed    Discharge instructions   Complete by: As directed    It was pleasure taking care of you. Please keep yourself well-hydrated and follow-up with your oncologist for further recommendations regarding getting radiation and management.   Increase activity slowly   Complete by: As directed      Allergies as of 06/21/2020   No Known Allergies     Medication List    STOP taking these medications   dexamethasone 6 MG tablet Commonly known as: Decadron     TAKE these medications   albuterol 108 (90 Base) MCG/ACT inhaler Commonly known as: VENTOLIN HFA Inhale 1-2 puffs into the lungs every 6 (six) hours as needed for wheezing or shortness of breath.   aspirin 325  MG EC tablet Take 1 tablet (325 mg total) by mouth daily. Start taking on: June 22, 2020   atorvastatin 40 MG tablet Commonly known as: LIPITOR Take 1 tablet (40 mg total) by mouth every evening.   Breztri Aerosphere 160-9-4.8 MCG/ACT Aero Generic drug:  Budeson-Glycopyrrol-Formoterol Inhale 2 puffs into the lungs in the morning and at bedtime.   gabapentin 300 MG capsule Commonly known as: NEURONTIN Take 300 mg by mouth 2 (two) times daily.   losartan-hydrochlorothiazide 50-12.5 MG tablet Commonly known as: HYZAAR Take 1 tablet by mouth daily.   nicotine 14 mg/24hr patch Commonly known as: NICODERM CQ - dosed in mg/24 hours Place 1 patch (14 mg total) onto the skin daily as needed for up to 14 days (smoking cessation).   oxyCODONE 5 MG immediate release tablet Commonly known as: Oxy IR/ROXICODONE Take 1-2 tablets (5-10 mg total) by mouth every 4 (four) hours as needed for severe pain.   oxyCODONE 10 mg 12 hr tablet Commonly known as: OXYCONTIN Take 1 tablet (10 mg total) by mouth every 8 (eight) hours.   pantoprazole 40 MG tablet Commonly known as: Protonix Take 1 tablet (40 mg total) by mouth daily.            Durable Medical Equipment  (From admission, onward)         Start     Ordered   06/20/20 1556  For home use only DME lightweight manual wheelchair with seat cushion  Once       Comments: Patient suffers from stage IV lung cancer, stroke, COVID-19 pneumonia which impairs their ability to perform daily activities like bathing, dressing, feeding, grooming, and toileting in the home.  A walker will not resolve  issue with performing activities of daily living. A wheelchair will allow patient to safely perform daily activities. Patient is not able to propel themselves in the home using a standard weight wheelchair due to general weakness. Patient can self propel in the lightweight wheelchair. Length of need Lifetime.  Patient has limited life expectancy. Accessories: elevating leg rests (ELRs), wheel locks, extensions and anti-tippers, with seat cushion and back cushion   06/20/20 1556   06/20/20 1544  For home use only DME oxygen  Once       Question Answer Comment  Length of Need Lifetime   Mode or (Route) Nasal  cannula   Liters per Minute 6   Frequency Continuous (stationary and portable oxygen unit needed)   Oxygen conserving device Yes   Oxygen delivery system Gas      06/20/20 1543   06/20/20 1542  For home use only DME 3 n 1  Once        06/20/20 1541   06/20/20 1540  For home use only DME Hospital bed  Once       Question Answer Comment  Length of Need Lifetime   Patient has (list medical condition): Stage IV lung cancer, CVA, COVID-19 pneumonia   The above medical condition requires: Patient requires the ability to reposition frequently   Bed type Semi-electric   [employer] Yes   Trapeze Bar Yes   Support Surface: Gel Overlay      06/20/20 1539          No Known Allergies  Consultations: Oncology Palliative care  Procedures/Studies: DG Chest 1 View  Result Date: 06/17/2020 CLINICAL DATA:  History of metastatic lung cancer. Altered mental status. EXAM: CHEST  1 VIEW COMPARISON:  June 11, 2020. FINDINGS: The heart size  and mediastinal contours are within normal limits. Left lung is clear. Right upper lobe irregular density is again noted consistent with malignancy. No pneumothorax or pleural effusion is noted. The visualized skeletal structures are unremarkable. IMPRESSION: Stable right upper lobe density consistent with malignancy. No other abnormality seen in the chest. Electronically Signed   By: Marijo Conception M.D.   On: 06/17/2020 13:18   CT HEAD WO CONTRAST  Result Date: 06/17/2020 CLINICAL DATA:  Altered mental status. History of metastatic lung cancer. EXAM: CT HEAD WITHOUT CONTRAST TECHNIQUE: Contiguous axial images were obtained from the base of the skull through the vertex without intravenous contrast. COMPARISON:  May 29, 2020. FINDINGS: Brain: Mild chronic ischemic white matter disease is noted. No mass effect or midline shift is noted. Ventricular size is within normal limits. There is no evidence of mass lesion, hemorrhage or acute infarction. Possible metastatic  lesions noted on recent MRI are not visualized on this study. Vascular: No hyperdense vessel or unexpected calcification. Skull: No definite fracture is noted. Multiple small rounded lucencies are noted throughout the skull which may represent benign venous lakes, but metastatic disease cannot be excluded given the history of metastatic lung cancer. Sinuses/Orbits: No acute finding. Other: None. IMPRESSION: 1. Mild chronic ischemic white matter disease. No acute intracranial abnormality seen. 2. Multiple small rounded lucencies are noted throughout the skull which may represent benign venous lakes, but metastatic disease cannot be excluded given the history of metastatic lung cancer. Electronically Signed   By: Marijo Conception M.D.   On: 06/17/2020 11:59   MR BRAIN W WO CONTRAST  Result Date: 06/17/2020 CLINICAL DATA:  Confusion with right arm and leg weakness. History of metastatic lung cancer. EXAM: MRI HEAD WITHOUT AND WITH CONTRAST TECHNIQUE: Multiplanar, multiecho pulse sequences of the brain and surrounding structures were obtained without and with intravenous contrast. CONTRAST:  81mL GADAVIST GADOBUTROL 1 MMOL/ML IV SOLN COMPARISON:  Head CT 06/17/2020 and MRI 05/29/2020 FINDINGS: Brain: There are patchy acute to subacute infarcts in the left MCA territory, overall moderate in volume with greatest involvement of the lateral frontal and parietal cortex. There is also border zone involvement in the lateral left occipital lobe. Associated small volume petechial blood products are noted in the left frontoparietal and left occipital infarcts, and there is also an unchanged chronic microhemorrhage in the posterior left temporal lobe. Punctate acute infarcts are present in the left basal ganglia. Periventricular white matter T2 hyperintensities are unchanged from the prior MRI and are nonspecific but compatible with mild chronic small vessel ischemic disease. Chronic lacunar infarcts are noted in the thalami and  right lentiform nucleus. An unchanged T2 hyperintense focus in the right midbrain may represent a dilated perivascular space or chronic lacunar infarct. There is mild cerebral atrophy. No midline shift or extra-axial fluid collection is evident. A plaque-like 3 mm enhancing focus along the inferior right cerebellar hemisphere is unchanged from the prior MRI (series 21, image 31). The punctate enhancing focus in the medial left cerebellar hemisphere on the prior MRI is not evident today. A 2 mm focus of cortical enhancement in left frontoparietal cortex is secondary to the recent infarct. Vascular: Major intracranial vascular flow voids are preserved. Skull and upper cervical spine: Small enhancing foci in the clivus are unchanged (series 21, image 38). A 2 cm T1 hypointense lesion in the right parietal skull including a punctate focus of internal enhancement is also unchanged (series 16, image 129 and series 21, image 134). Sinuses/Orbits: Unremarkable orbits.  Mild left maxillary sinus mucosal thickening. Small right mastoid effusion. Other: None. IMPRESSION: 1. Patchy acute to subacute left cerebral hemispheric infarcts primarily in the MCA territory. 2. Unchanged 3 mm enhancing focus in the right cerebellum, possibly a metastasis although a subacute infarct is an alternative consideration. 3. Nonvisualization of the punctate enhancing focus in the left cerebellum on the prior MRI. No evidence of new intracranial metastases. 4. Unchanged small enhancing calvarial lesions. Electronically Signed   By: Logan Bores M.D.   On: 06/17/2020 20:06   MR BRAIN W WO CONTRAST  Result Date: 05/29/2020 CLINICAL DATA:  Neoplasm, staging. EXAM: MRI HEAD WITHOUT AND WITH CONTRAST MRI CERVICAL SPINE WITHOUT AND WITH CONTRAST MRI LUMBAR SPINE WITHOUT AND WITH CONTRAST TECHNIQUE: Multiplanar, multiecho pulse sequences of the brain and surrounding structures, and cervical spine, to include the craniocervical junction and  cervicothoracic junction, were obtained without and with intravenous contrast. Multiplanar and multiecho pulse sequences of the lumbar spine were obtained without and with intravenous contrast. CONTRAST:  54mL GADAVIST GADOBUTROL 1 MMOL/ML IV SOLN COMPARISON:  05/02/2020 lumbar spine radiographs. 05/22/2020 nuclear medicine PET scan. FINDINGS: MRI HEAD FINDINGS Brain: Scattered T2 hyperintense periventricular and subcortical white matter foci are nonspecific however commonly associated with chronic microvascular ischemic changes. Mild cerebral atrophy with ex vacuo dilatation. No acute infarct or intracranial hemorrhage. Tiny left temporal remote microhemorrhage. Sequela of remote left thalamic and right midbrain insults. No midline shift, ventriculomegaly or extra-axial fluid collection. Normal appearance of the sella turcica and pituitary gland. -3 mm inferior right cerebellar enhancing focus (18:14). -punctate medial left cerebellar enhancement (18:34). Vascular: Major intracranial flow voids are preserved. Skull and upper cervical spine: 2 mm right parietal enhancing focus (18:120). Scattered enhancing foci measuring up to 4 mm within the basilar and anterolateral occipital bone (18:24). Sinuses/Orbits: Normal orbits. Minimal ethmoid and left maxillary sinus mucosal thickening. Trace right mastoid effusion. Other: 6 mm nonenhancing diffusion restricting right occipital scalp nodule (5:16), likely an epidermal inclusion cyst. MRI CERVICAL SPINE FINDINGS Alignment: Normal. Vertebrae: Diffusely T1 hypointense appearance of the bone marrow. Vertebral body heights are preserved. Bone marrow edema involving the left C4-5 and C5-6 facet joints. Cord: Normal signal intensity morphology.  No abnormal enhancement. Posterior Fossa, vertebral arteries: Please see MRI brain. Disc levels: Multilevel desiccation.  Mild C5-6 disc space loss. C2-3: No significant disc bulge, spinal canal or neural foraminal narrowing. C3-4: Small  central protrusion with uncovertebral and facet hypertrophy. Patent spinal canal and left neural foramen. Mild right neural foraminal narrowing. C4-5: There is ill-defined enhancing soft tissue encasing the left C4-5 facet joint measuring 2.0 x 1.6 cm (12:16). The soft tissue extends into the left neural and transverse foramen, encasing the left vertebral artery (12:15). Left predominant and dorsal C5 vertebral body erosions. Prevertebral soft tissue thickening and enhancement is concerning for extension of tumor. There is also ill-defined enhancement along the right anterolateral C5 vertebral body (12:15). Inflammatory changes are seen within the left paraspinal region (11:13). Circumferential soft tissue thickening and enhancement involving the epidural space at the C4-5 level. Small disc osteophyte complex with uncovertebral and right facet hypertrophy. Mild spinal canal narrowing. Patent right neural foramen. Moderate left neural foraminal narrowing secondary to soft tissue encroachment. C5-6: There is inferior extension of ill-defined soft tissue along the left transverse/neural foramen and anterior left facet joint (12:12) left facet joint bone marrow edema. Prominence of mild enhancement involving the epidural space. Disc osteophyte complex with uncovertebral and facet hypertrophy. Mild spinal canal and right neural  foraminal narrowing. Soft tissue encroachment narrows the left neural foramen. C6-7: Disc osteophyte complex with uncovertebral and facet hypertrophy. Patent spinal canal and bilateral neural foramina. C7-T1: Disc osteophyte complex with uncovertebral and facet hypertrophy. Patent spinal canal and neural foramen. Paraspinal tissues: As detailed above. MRI LUMBAR SPINE FINDINGS Segmentation:  Standard. Alignment:  Normal. Vertebrae: Diffuse bone marrow heterogeneity. Multilevel osteophytosis. Metastases are detailed below. Conus medullaris and cauda equina: Conus extends to the L2 level. Conus and  cauda equina appear normal. Filar lipoma. Disc levels: Multilevel desiccation and disc space loss most prominent at the L5-S1 level. L1-2: No significant disc bulge, spinal canal or neural foraminal narrowing. L2-3: Disc bulge, ligamentum flavum and bilateral facet hypertrophy. Mild spinal canal and bilateral neural foraminal narrowing. L3-4: L3 superior endplate pathologic fracture deformity with approximately 10-20% height loss. 4.7 x 3.4 cm enhancing metastatic lesion involving the right L3 vertebral body and posterior elements (13:19). There is extension of enhancing tumor into the prevertebral and anterior epidural spaces at the L3 level (13:20, 8:19) effacing the lateral recesses and abutting the exiting bilateral L3 nerve roots. Ligamentum flavum and bilateral facet hypertrophy. Mild spinal canal and bilateral neural foraminal narrowing. L4-5: Disc bulge abutting the exiting left L4 nerve root, ligamentum flavum and bilateral facet hypertrophy. Mild spinal canal and moderate bilateral neural foraminal narrowing. Abnormal right L4-5 neural foramen soft tissue is discussed below. L5-S1: Disc bulge with superimposed central protrusion abutting the descending bilateral S1 nerve roots, ligamentum flavum and bilateral facet hypertrophy. Patent spinal canal. Mild bilateral neural foraminal narrowing. Paraspinal and other soft tissues: At the level of the lumbosacral junction and extending along the right sacroiliac joint there is enhancing soft tissue measuring 4.2 x 2.6 cm (13:34). Soft tissue extends into the right L4-5 neural foramen (8:31) there are adjacent erosions involving the right lateral L5 vertebral body and superior right sacral ala. Anterior cortical defect is concerning for an associated pathologic fracture (8:36). IMPRESSION: MRI brain: -Sub-5 mm bilateral cerebellar enhancing foci are concerning for metastases. -Sub-5 mm right parietal bone and basilar occipital enhancing foci, concerning for  osseous metastases. -Sequela of remote left thalamic and right midbrain insults. Mild cerebral atrophy and chronic microvascular ischemic changes. MRI cervical spine: -Metastatic soft tissue involving the left C5-6 facet joint and right anterolateral C5 vertebral body. There is extension of tumor into the prevertebral and circumferential epidural space at the C5-6 level without significant spinal canal narrowing. -Tumor also extends to involve the left C5-6 and C6-7 transverse and neural foramen with partial encasement of the vertebral artery. -No evidence of spinal cord enhancement. MRI lumbar spine: - L3 metastatic lesion and pathologic fracture with approximately 20% height loss. There is extension of tumor into the prevertebral and anterior epidural space at the L3 level with mild spinal canal and bilateral neural foraminal narrowing. - Partially imaged 4.2 cm right lumbosacral junction/SI joint metastasis with extension of tumor into the right L4-5 neural foramen. Query associated pathologic fracture. - Please see concurrent MR sacrum for better characterization. These results will be called to the ordering clinician or representative by the Radiologist Assistant, and communication documented in the PACS or [employer]. Electronically Signed   By: Primitivo Gauze M.D.   On: 05/29/2020 14:49   MR Cervical Spine W Wo Contrast  Result Date: 05/29/2020 CLINICAL DATA:  Neoplasm, staging. EXAM: MRI HEAD WITHOUT AND WITH CONTRAST MRI CERVICAL SPINE WITHOUT AND WITH CONTRAST MRI LUMBAR SPINE WITHOUT AND WITH CONTRAST TECHNIQUE: Multiplanar, multiecho pulse sequences of the  brain and surrounding structures, and cervical spine, to include the craniocervical junction and cervicothoracic junction, were obtained without and with intravenous contrast. Multiplanar and multiecho pulse sequences of the lumbar spine were obtained without and with intravenous contrast. CONTRAST:  21mL GADAVIST GADOBUTROL 1 MMOL/ML IV  SOLN COMPARISON:  05/02/2020 lumbar spine radiographs. 05/22/2020 nuclear medicine PET scan. FINDINGS: MRI HEAD FINDINGS Brain: Scattered T2 hyperintense periventricular and subcortical white matter foci are nonspecific however commonly associated with chronic microvascular ischemic changes. Mild cerebral atrophy with ex vacuo dilatation. No acute infarct or intracranial hemorrhage. Tiny left temporal remote microhemorrhage. Sequela of remote left thalamic and right midbrain insults. No midline shift, ventriculomegaly or extra-axial fluid collection. Normal appearance of the sella turcica and pituitary gland. -3 mm inferior right cerebellar enhancing focus (18:14). -punctate medial left cerebellar enhancement (18:34). Vascular: Major intracranial flow voids are preserved. Skull and upper cervical spine: 2 mm right parietal enhancing focus (18:120). Scattered enhancing foci measuring up to 4 mm within the basilar and anterolateral occipital bone (18:24). Sinuses/Orbits: Normal orbits. Minimal ethmoid and left maxillary sinus mucosal thickening. Trace right mastoid effusion. Other: 6 mm nonenhancing diffusion restricting right occipital scalp nodule (5:16), likely an epidermal inclusion cyst. MRI CERVICAL SPINE FINDINGS Alignment: Normal. Vertebrae: Diffusely T1 hypointense appearance of the bone marrow. Vertebral body heights are preserved. Bone marrow edema involving the left C4-5 and C5-6 facet joints. Cord: Normal signal intensity morphology.  No abnormal enhancement. Posterior Fossa, vertebral arteries: Please see MRI brain. Disc levels: Multilevel desiccation.  Mild C5-6 disc space loss. C2-3: No significant disc bulge, spinal canal or neural foraminal narrowing. C3-4: Small central protrusion with uncovertebral and facet hypertrophy. Patent spinal canal and left neural foramen. Mild right neural foraminal narrowing. C4-5: There is ill-defined enhancing soft tissue encasing the left C4-5 facet joint measuring  2.0 x 1.6 cm (12:16). The soft tissue extends into the left neural and transverse foramen, encasing the left vertebral artery (12:15). Left predominant and dorsal C5 vertebral body erosions. Prevertebral soft tissue thickening and enhancement is concerning for extension of tumor. There is also ill-defined enhancement along the right anterolateral C5 vertebral body (12:15). Inflammatory changes are seen within the left paraspinal region (11:13). Circumferential soft tissue thickening and enhancement involving the epidural space at the C4-5 level. Small disc osteophyte complex with uncovertebral and right facet hypertrophy. Mild spinal canal narrowing. Patent right neural foramen. Moderate left neural foraminal narrowing secondary to soft tissue encroachment. C5-6: There is inferior extension of ill-defined soft tissue along the left transverse/neural foramen and anterior left facet joint (12:12) left facet joint bone marrow edema. Prominence of mild enhancement involving the epidural space. Disc osteophyte complex with uncovertebral and facet hypertrophy. Mild spinal canal and right neural foraminal narrowing. Soft tissue encroachment narrows the left neural foramen. C6-7: Disc osteophyte complex with uncovertebral and facet hypertrophy. Patent spinal canal and bilateral neural foramina. C7-T1: Disc osteophyte complex with uncovertebral and facet hypertrophy. Patent spinal canal and neural foramen. Paraspinal tissues: As detailed above. MRI LUMBAR SPINE FINDINGS Segmentation:  Standard. Alignment:  Normal. Vertebrae: Diffuse bone marrow heterogeneity. Multilevel osteophytosis. Metastases are detailed below. Conus medullaris and cauda equina: Conus extends to the L2 level. Conus and cauda equina appear normal. Filar lipoma. Disc levels: Multilevel desiccation and disc space loss most prominent at the L5-S1 level. L1-2: No significant disc bulge, spinal canal or neural foraminal narrowing. L2-3: Disc bulge, ligamentum  flavum and bilateral facet hypertrophy. Mild spinal canal and bilateral neural foraminal narrowing. L3-4: L3 superior endplate  pathologic fracture deformity with approximately 10-20% height loss. 4.7 x 3.4 cm enhancing metastatic lesion involving the right L3 vertebral body and posterior elements (13:19). There is extension of enhancing tumor into the prevertebral and anterior epidural spaces at the L3 level (13:20, 8:19) effacing the lateral recesses and abutting the exiting bilateral L3 nerve roots. Ligamentum flavum and bilateral facet hypertrophy. Mild spinal canal and bilateral neural foraminal narrowing. L4-5: Disc bulge abutting the exiting left L4 nerve root, ligamentum flavum and bilateral facet hypertrophy. Mild spinal canal and moderate bilateral neural foraminal narrowing. Abnormal right L4-5 neural foramen soft tissue is discussed below. L5-S1: Disc bulge with superimposed central protrusion abutting the descending bilateral S1 nerve roots, ligamentum flavum and bilateral facet hypertrophy. Patent spinal canal. Mild bilateral neural foraminal narrowing. Paraspinal and other soft tissues: At the level of the lumbosacral junction and extending along the right sacroiliac joint there is enhancing soft tissue measuring 4.2 x 2.6 cm (13:34). Soft tissue extends into the right L4-5 neural foramen (8:31) there are adjacent erosions involving the right lateral L5 vertebral body and superior right sacral ala. Anterior cortical defect is concerning for an associated pathologic fracture (8:36). IMPRESSION: MRI brain: -Sub-5 mm bilateral cerebellar enhancing foci are concerning for metastases. -Sub-5 mm right parietal bone and basilar occipital enhancing foci, concerning for osseous metastases. -Sequela of remote left thalamic and right midbrain insults. Mild cerebral atrophy and chronic microvascular ischemic changes. MRI cervical spine: -Metastatic soft tissue involving the left C5-6 facet joint and right  anterolateral C5 vertebral body. There is extension of tumor into the prevertebral and circumferential epidural space at the C5-6 level without significant spinal canal narrowing. -Tumor also extends to involve the left C5-6 and C6-7 transverse and neural foramen with partial encasement of the vertebral artery. -No evidence of spinal cord enhancement. MRI lumbar spine: - L3 metastatic lesion and pathologic fracture with approximately 20% height loss. There is extension of tumor into the prevertebral and anterior epidural space at the L3 level with mild spinal canal and bilateral neural foraminal narrowing. - Partially imaged 4.2 cm right lumbosacral junction/SI joint metastasis with extension of tumor into the right L4-5 neural foramen. Query associated pathologic fracture. - Please see concurrent MR sacrum for better characterization. These results will be called to the ordering clinician or representative by the Radiologist Assistant, and communication documented in the PACS or [employer]. Electronically Signed   By: Primitivo Gauze M.D.   On: 05/29/2020 14:49   MR Lumbar Spine W Wo Contrast  Result Date: 05/29/2020 CLINICAL DATA:  Neoplasm, staging. EXAM: MRI HEAD WITHOUT AND WITH CONTRAST MRI CERVICAL SPINE WITHOUT AND WITH CONTRAST MRI LUMBAR SPINE WITHOUT AND WITH CONTRAST TECHNIQUE: Multiplanar, multiecho pulse sequences of the brain and surrounding structures, and cervical spine, to include the craniocervical junction and cervicothoracic junction, were obtained without and with intravenous contrast. Multiplanar and multiecho pulse sequences of the lumbar spine were obtained without and with intravenous contrast. CONTRAST:  58mL GADAVIST GADOBUTROL 1 MMOL/ML IV SOLN COMPARISON:  05/02/2020 lumbar spine radiographs. 05/22/2020 nuclear medicine PET scan. FINDINGS: MRI HEAD FINDINGS Brain: Scattered T2 hyperintense periventricular and subcortical white matter foci are nonspecific however commonly  associated with chronic microvascular ischemic changes. Mild cerebral atrophy with ex vacuo dilatation. No acute infarct or intracranial hemorrhage. Tiny left temporal remote microhemorrhage. Sequela of remote left thalamic and right midbrain insults. No midline shift, ventriculomegaly or extra-axial fluid collection. Normal appearance of the sella turcica and pituitary gland. -3 mm inferior right cerebellar enhancing  focus (18:14). -punctate medial left cerebellar enhancement (18:34). Vascular: Major intracranial flow voids are preserved. Skull and upper cervical spine: 2 mm right parietal enhancing focus (18:120). Scattered enhancing foci measuring up to 4 mm within the basilar and anterolateral occipital bone (18:24). Sinuses/Orbits: Normal orbits. Minimal ethmoid and left maxillary sinus mucosal thickening. Trace right mastoid effusion. Other: 6 mm nonenhancing diffusion restricting right occipital scalp nodule (5:16), likely an epidermal inclusion cyst. MRI CERVICAL SPINE FINDINGS Alignment: Normal. Vertebrae: Diffusely T1 hypointense appearance of the bone marrow. Vertebral body heights are preserved. Bone marrow edema involving the left C4-5 and C5-6 facet joints. Cord: Normal signal intensity morphology.  No abnormal enhancement. Posterior Fossa, vertebral arteries: Please see MRI brain. Disc levels: Multilevel desiccation.  Mild C5-6 disc space loss. C2-3: No significant disc bulge, spinal canal or neural foraminal narrowing. C3-4: Small central protrusion with uncovertebral and facet hypertrophy. Patent spinal canal and left neural foramen. Mild right neural foraminal narrowing. C4-5: There is ill-defined enhancing soft tissue encasing the left C4-5 facet joint measuring 2.0 x 1.6 cm (12:16). The soft tissue extends into the left neural and transverse foramen, encasing the left vertebral artery (12:15). Left predominant and dorsal C5 vertebral body erosions. Prevertebral soft tissue thickening and  enhancement is concerning for extension of tumor. There is also ill-defined enhancement along the right anterolateral C5 vertebral body (12:15). Inflammatory changes are seen within the left paraspinal region (11:13). Circumferential soft tissue thickening and enhancement involving the epidural space at the C4-5 level. Small disc osteophyte complex with uncovertebral and right facet hypertrophy. Mild spinal canal narrowing. Patent right neural foramen. Moderate left neural foraminal narrowing secondary to soft tissue encroachment. C5-6: There is inferior extension of ill-defined soft tissue along the left transverse/neural foramen and anterior left facet joint (12:12) left facet joint bone marrow edema. Prominence of mild enhancement involving the epidural space. Disc osteophyte complex with uncovertebral and facet hypertrophy. Mild spinal canal and right neural foraminal narrowing. Soft tissue encroachment narrows the left neural foramen. C6-7: Disc osteophyte complex with uncovertebral and facet hypertrophy. Patent spinal canal and bilateral neural foramina. C7-T1: Disc osteophyte complex with uncovertebral and facet hypertrophy. Patent spinal canal and neural foramen. Paraspinal tissues: As detailed above. MRI LUMBAR SPINE FINDINGS Segmentation:  Standard. Alignment:  Normal. Vertebrae: Diffuse bone marrow heterogeneity. Multilevel osteophytosis. Metastases are detailed below. Conus medullaris and cauda equina: Conus extends to the L2 level. Conus and cauda equina appear normal. Filar lipoma. Disc levels: Multilevel desiccation and disc space loss most prominent at the L5-S1 level. L1-2: No significant disc bulge, spinal canal or neural foraminal narrowing. L2-3: Disc bulge, ligamentum flavum and bilateral facet hypertrophy. Mild spinal canal and bilateral neural foraminal narrowing. L3-4: L3 superior endplate pathologic fracture deformity with approximately 10-20% height loss. 4.7 x 3.4 cm enhancing metastatic  lesion involving the right L3 vertebral body and posterior elements (13:19). There is extension of enhancing tumor into the prevertebral and anterior epidural spaces at the L3 level (13:20, 8:19) effacing the lateral recesses and abutting the exiting bilateral L3 nerve roots. Ligamentum flavum and bilateral facet hypertrophy. Mild spinal canal and bilateral neural foraminal narrowing. L4-5: Disc bulge abutting the exiting left L4 nerve root, ligamentum flavum and bilateral facet hypertrophy. Mild spinal canal and moderate bilateral neural foraminal narrowing. Abnormal right L4-5 neural foramen soft tissue is discussed below. L5-S1: Disc bulge with superimposed central protrusion abutting the descending bilateral S1 nerve roots, ligamentum flavum and bilateral facet hypertrophy. Patent spinal canal. Mild bilateral neural foraminal narrowing.  Paraspinal and other soft tissues: At the level of the lumbosacral junction and extending along the right sacroiliac joint there is enhancing soft tissue measuring 4.2 x 2.6 cm (13:34). Soft tissue extends into the right L4-5 neural foramen (8:31) there are adjacent erosions involving the right lateral L5 vertebral body and superior right sacral ala. Anterior cortical defect is concerning for an associated pathologic fracture (8:36). IMPRESSION: MRI brain: -Sub-5 mm bilateral cerebellar enhancing foci are concerning for metastases. -Sub-5 mm right parietal bone and basilar occipital enhancing foci, concerning for osseous metastases. -Sequela of remote left thalamic and right midbrain insults. Mild cerebral atrophy and chronic microvascular ischemic changes. MRI cervical spine: -Metastatic soft tissue involving the left C5-6 facet joint and right anterolateral C5 vertebral body. There is extension of tumor into the prevertebral and circumferential epidural space at the C5-6 level without significant spinal canal narrowing. -Tumor also extends to involve the left C5-6 and C6-7  transverse and neural foramen with partial encasement of the vertebral artery. -No evidence of spinal cord enhancement. MRI lumbar spine: - L3 metastatic lesion and pathologic fracture with approximately 20% height loss. There is extension of tumor into the prevertebral and anterior epidural space at the L3 level with mild spinal canal and bilateral neural foraminal narrowing. - Partially imaged 4.2 cm right lumbosacral junction/SI joint metastasis with extension of tumor into the right L4-5 neural foramen. Query associated pathologic fracture. - Please see concurrent MR sacrum for better characterization. These results will be called to the ordering clinician or representative by the Radiologist Assistant, and communication documented in the PACS or [employer]. Electronically Signed   By: Primitivo Gauze M.D.   On: 05/29/2020 14:49   MR SACRUM SI JOINTS W WO CONTRAST  Result Date: 05/30/2020 CLINICAL DATA:  Patient with a history of metastatic lung carcinoma. Onset low back pain approximately 1 month ago. Hypermetabolic lesion in the right sacrum on PET scan 05/22/2020. EXAM: MRI SACRUM WITHOUT AND WITH CONTRAST TECHNIQUE: Multiplanar, multisequence MR imaging of the sacrum was performed both before and after administration of intravenous contrast. CONTRAST:  6 mL GADAVIST IV SOLN COMPARISON:  PET scan 05/22/2020. FINDINGS: Bones/Joint/Cartilage As seen on the prior PET scan, there is a destructive, enhancing mass lesion in the right sacrum measuring approximately 4.4 cm transverse by 2.5 cm AP by 4.7 cm craniocaudal. The right sacral component of the lesion extends into the inferior aspect of the right L5-S1 foramen and impinges on the right L5 root within and beyond the foramen. Abnormal signal and enhancement in the right ilium consistent with tumor immediately posterior to the soft tissue mass measures 3.1 cm transverse by 1.0 cm AP by 4.8 cm craniocaudal. Tumor does not appear to impact the right  S1 root. Ligaments Intact. Muscles and Tendons Intact and normal in appearance. Soft tissues Imaged intrapelvic contents are unremarkable. IMPRESSION: Metastatic disease in the right sacrum and posterior right ilium results in impingement on the right L5 root in the periphery of the foramen and beyond the foramen. Electronically Signed   By: Inge Rise M.D.   On: 05/30/2020 09:27   US Carotid Bilateral  Result Date: 06/18/2020 CLINICAL DATA:  Acute cerebral infarcts. EXAM: BILATERAL CAROTID DUPLEX ULTRASOUND TECHNIQUE: Pearline Cables scale imaging, color Doppler and duplex ultrasound were performed of bilateral carotid and vertebral arteries in the neck. COMPARISON:  None. FINDINGS: Criteria: Quantification of carotid stenosis is based on velocity parameters that correlate the residual internal carotid diameter with NASCET-based stenosis levels, using the diameter of  the distal internal carotid lumen as the denominator for stenosis measurement. The following velocity measurements were obtained: RIGHT ICA:  133/51 cm/sec CCA:  27/78 cm/sec SYSTOLIC ICA/CCA RATIO:  1.4 ECA:  199 cm/sec LEFT ICA:  114/37 cm/sec CCA:  242/35 cm/sec SYSTOLIC ICA/CCA RATIO:  1.1 ECA:  168 cm/sec RIGHT CAROTID ARTERY: Mild to moderate calcified plaque at the level of the distal bulb and proximal right ICA. Estimated right ICA stenosis is 50-69%. RIGHT VERTEBRAL ARTERY: Antegrade flow with normal waveform and velocity. LEFT CAROTID ARTERY: Mild to moderate predominately calcified plaque at the level of the left carotid bulb and proximal left ICA. Estimated left ICA stenosis is less than 50% based on velocities. LEFT VERTEBRAL ARTERY: Antegrade flow with normal waveform and velocity. IMPRESSION: Mild to moderate calcified plaque at the level of bilateral carotid bulbs and proximal internal carotid arteries. Estimated right ICA stenosis is 50-69% and estimated left ICA stenosis is less than 50%. Electronically Signed   By: Aletta Edouard M.D.    On: 06/18/2020 09:25   CT Biopsy  Result Date: 05/27/2020 INDICATION: 64 year old with a suspicious right lung nodule and suspect metastatic lung disease. Patient has a lucent bone lesion in the right sacral ala. Tissue diagnosis is needed. EXAM: CT-GUIDED CORE BIOPSY OF RIGHT SACRAL BONE LESION Physician: Stephan Minister. Anselm Pancoast, MD MEDICATIONS: None. ANESTHESIA/SEDATION: Fentanyl 50 mcg IV; Versed 1.0 mg IV Moderate Sedation Time:  19 minutes The patient was continuously monitored during the procedure by the interventional radiology nurse under my direct supervision. COMPLICATIONS: None immediate. PROCEDURE: The procedure was explained to the patient. The risks and benefits of the procedure were discussed and the patient's questions were addressed. Informed consent was obtained from the patient. Time-out was performed. Patient was placed supine on CT scanner. CT images through the lower abdomen were obtained. Right side of the abdomen was prepped with chlorhexidine and sterile field was created. Skin was anesthetized with 1% lidocaine. Small skin incision was made. Using CT guidance, a 17 gauge coaxial needle was directed into the right sacral lesion. Needle was positioned along the lateral aspect of the lesion in order to avoid the nerve. Multiple core biopsies were obtained with an 18 gauge core device. Specimens placed in formalin. 17 gauge needle was removed without complication. Bandage placed over the puncture site. FINDINGS: Destructive lucent lesion involving the right sacral ala. Biopsy needle was confirmed within the lesion. Core specimens obtained. IMPRESSION: CT-guided core biopsies of the right sacral bone lesion. Electronically Signed   By: Markus Daft M.D.   On: 05/27/2020 12:56   DG CHEST PORT 1 VIEW  Result Date: 06/11/2020 CLINICAL DATA:  COVID. EXAM: PORTABLE CHEST 1 VIEW COMPARISON:  06/08/2020.  PET-CT 05/22/2020. FINDINGS: Mediastinum and hilar structures are stable. Known spiculated nodule in  the right upper best identified on prior PET-CT. No acute infiltrates. No pleural effusion or pneumothorax. Known bony lesions best identified by prior PET-CT. Carotid vascular calcification. IMPRESSION: 1. Known spiculated right upper lung nodule and multiple bony lesions best identified by prior PET-CT. 2.  No acute pulmonary disease.  Chest is stable from prior exam. 3.  Carotid vascular disease. Electronically Signed   By: Marcello Moores  Register   On: 06/11/2020 06:11   DG Chest Port 1 View  Result Date: 06/08/2020 CLINICAL DATA:  Dyspnea. Recent diagnosis of lung cancer. Family positive for COVID-19. EXAM: PORTABLE CHEST 1 VIEW COMPARISON:  Radiograph and CT 05/16/2020.  PET CT 05/22/2020. FINDINGS: The lungs are hyperinflated. Stable heart size  and mediastinal contours. No convincing acute airspace disease. The known spiculated nodule at the right lung apex is only faintly visualized. No pneumothorax or large pleural effusion. Known osseous metastasis on PET not well seen by radiograph. IMPRESSION: 1. No acute findings. 2. Chronic hyperinflation. The known spiculated nodule at the right lung apex is only faintly visualized radiographically. Electronically Signed   By: Keith Rake M.D.   On: 06/08/2020 18:43   ECHOCARDIOGRAM COMPLETE  Result Date: 06/19/2020    ECHOCARDIOGRAM REPORT   Patient Name:   Douglas [name] Date of Exam: 06/18/2020 Medical Rec #:  [id number]        Height:       65.0 in Accession #:    [id number]       Weight:       150.1 lb Date of Birth:  September 25, 1956        BSA:          1.751 m Patient Age:    30 years         BP:           150/99 mmHg Patient Gender: M                HR:           61 bpm. Exam Location:  [facility] Procedure: 2D Echo, Color Doppler and Cardiac Doppler Indications:     I163.9 Stroke  History:         Patient has no prior history of Echocardiogram examinations.                  COPD; Risk Factors:Hypertension and Dyslipidemia. Pt tested                  positive for  COVID-19 on 06/08/20.  Sonographer:     Charmayne Sheer RDCS (AE) Referring Phys:  [id number] Loletha Grayer Diagnosing Phys: Serafina Royals MD  Sonographer Comments: Technically difficult study due to poor echo windows. Image acquisition challenging due to uncooperative patient and Image acquisition challenging due to COPD. IMPRESSIONS  1. Left ventricular ejection fraction, by estimation, is 65 to 70%. The left ventricle has normal function. The left ventricle has no regional wall motion abnormalities. Left ventricular diastolic parameters were normal.  2. Right ventricular systolic function is normal. The right ventricular size is normal.  3. The mitral valve is normal in structure. Trivial mitral valve regurgitation.  4. The aortic valve is normal in structure. Aortic valve regurgitation is not visualized. FINDINGS  Left Ventricle: Left ventricular ejection fraction, by estimation, is 65 to 70%. The left ventricle has normal function. The left ventricle has no regional wall motion abnormalities. The left ventricular internal cavity size was small. There is no left ventricular hypertrophy. Left ventricular diastolic parameters were normal. Right Ventricle: The right ventricular size is normal. No increase in right ventricular wall thickness. Right ventricular systolic function is normal. Left Atrium: Left atrial size was normal in size. Right Atrium: Right atrial size was normal in size. Pericardium: There is no evidence of pericardial effusion. Mitral Valve: The mitral valve is normal in structure. Trivial mitral valve regurgitation. MV peak gradient, 2.0 mmHg. The mean mitral valve gradient is 1.0 mmHg. Tricuspid Valve: The tricuspid valve is normal in structure. Tricuspid valve regurgitation is trivial. Aortic Valve: The aortic valve is normal in structure. Aortic valve regurgitation is not visualized. Aortic valve mean gradient measures 1.0 mmHg. Aortic valve peak gradient measures 2.7 mmHg. Aortic valve area, by VTI  measures 3.10 cm. Pulmonic Valve: The pulmonic valve was normal in structure. Pulmonic valve regurgitation is trivial. Aorta: The aortic root and ascending aorta are structurally normal, with no evidence of dilitation. IAS/Shunts: No atrial level shunt detected by color flow Doppler.  LEFT VENTRICLE PLAX 2D LVOT diam:     2.20 cm  Diastology LV SV:         49       LV e' lateral:   7.51 cm/s LV SV Index:   28       LV E/e' lateral: 6.3 LVOT Area:     3.80 cm LV e' medial:    6.96 cm/s                         LV E/e' medial:  6.8  AORTIC VALVE                   PULMONIC VALVE AV Area (Vmax):    3.10 cm    PV Vmax:       1.08 m/s AV Area (Vmean):   3.65 cm    PV Vmean:      75.400 cm/s AV Area (VTI):     3.10 cm    PV VTI:        0.190 m AV Vmax:           82.50 cm/s  PV Peak grad:  4.7 mmHg AV Vmean:          51.700 cm/s PV Mean grad:  3.0 mmHg AV VTI:            0.158 m AV Peak Grad:      2.7 mmHg AV Mean Grad:      1.0 mmHg LVOT Vmax:         67.20 cm/s LVOT Vmean:        49.600 cm/s LVOT VTI:          0.129 m LVOT/AV VTI ratio: 0.82  AORTA Ao Root diam: 3.50 cm MITRAL VALVE MV Area (PHT): 2.32 cm    SHUNTS MV Peak grad:  2.0 mmHg    Systemic VTI:  0.13 m MV Mean grad:  1.0 mmHg    Systemic Diam: 2.20 cm MV Vmax:       0.71 m/s MV Vmean:      39.4 cm/s MV Decel Time: 327 msec MV E velocity: 47.10 cm/s MV A velocity: 55.30 cm/s MV E/A ratio:  0.85 Serafina Royals MD Electronically signed by Serafina Royals MD Signature Date/Time: 06/19/2020/12:14:55 PM    Final      Subjective: Patient appears little confused when seen today.  He wants to go home.  Pain was better today.  Discharge Exam: Vitals:   06/21/20 0821 06/21/20 1141  BP:  115/81  Pulse:  97  Resp:  17  Temp:  98.4 F (36.9 C)  SpO2: 95% 100%   Vitals:   06/21/20 0341 06/21/20 0729 06/21/20 0821 06/21/20 1141  BP: (!) 160/99 (!) 141/99  115/81  Pulse: 98 95  97  Resp: (!) 21 20  17   Temp: 98.4 F (36.9 C) 97.7 F (36.5 C)  98.4 F  (36.9 C)  TempSrc: Oral Oral  Oral  SpO2: (!) 89% 100% 95% 100%  Weight:      Height:        General: Pt is alert, awake, not in acute distress Cardiovascular: RRR, S1/S2 +, no rubs, no gallops Respiratory: CTA bilaterally, no  wheezing, no rhonchi Abdominal: Soft, NT, ND, bowel sounds + Extremities: no edema, no cyanosis   The results of significant diagnostics from this hospitalization (including imaging, microbiology, ancillary and laboratory) are listed below for reference.    Microbiology: No results found for this or any previous visit (from the past 240 hour(s)).   Labs: BNP (last 3 results) Recent Labs    06/08/20 1831  BNP 16.0   Basic Metabolic Panel: Recent Labs  Lab 06/17/20 1040 06/18/20 0523  NA 139 140  K 3.8 4.4  CL 94* 98  CO2 33* 31  GLUCOSE 98 118*  BUN 15 15  CREATININE 1.05 0.95  CALCIUM 8.9 8.8*  MG 2.3  --    Liver Function Tests: Recent Labs  Lab 06/17/20 1040  AST 23  ALT 24  ALKPHOS 68  BILITOT 0.8  PROT 6.9  ALBUMIN 3.3*   No results for input(s): LIPASE, AMYLASE in the last 168 hours. Recent Labs  Lab 06/19/20 0506  AMMONIA 18   CBC: Recent Labs  Lab 06/17/20 1040 06/18/20 0523  WBC 16.0* 8.9  NEUTROABS 13.3*  --   HGB 10.7* 10.0*  HCT 32.9* 30.9*  MCV 91.4 91.7  PLT 461* 414*   Cardiac Enzymes: No results for input(s): CKTOTAL, CKMB, CKMBINDEX, TROPONINI in the last 168 hours. BNP: Invalid input(s): POCBNP CBG: No results for input(s): GLUCAP in the last 168 hours. D-Dimer No results for input(s): DDIMER in the last 72 hours. Hgb A1c No results for input(s): HGBA1C in the last 72 hours. Lipid Profile No results for input(s): CHOL, HDL, LDLCALC, TRIG, CHOLHDL, LDLDIRECT in the last 72 hours. Thyroid function studies No results for input(s): TSH, T4TOTAL, T3FREE, THYROIDAB in the last 72 hours.  Invalid input(s): FREET3 Anemia work up No results for input(s): VITAMINB12, FOLATE, FERRITIN, TIBC, IRON,  RETICCTPCT in the last 72 hours. Urinalysis    Component Value Date/Time   COLORURINE STRAW (A) 06/17/2020 1233   APPEARANCEUR CLEAR (A) 06/17/2020 1233   LABSPEC 1.006 06/17/2020 1233   PHURINE 7.0 06/17/2020 1233   GLUCOSEU NEGATIVE 06/17/2020 1233   HGBUR NEGATIVE 06/17/2020 1233   BILIRUBINUR NEGATIVE 06/17/2020 1233   KETONESUR NEGATIVE 06/17/2020 1233   PROTEINUR NEGATIVE 06/17/2020 1233   NITRITE NEGATIVE 06/17/2020 1233   LEUKOCYTESUR NEGATIVE 06/17/2020 1233   Sepsis Labs Invalid input(s): PROCALCITONIN,  WBC,  LACTICIDVEN Microbiology No results found for this or any previous visit (from the past 240 hour(s)).  Time coordinating discharge: Over 30 minutes  SIGNED:  Lorella Nimrod, MD  [facility] 06/21/2020, 2:23 PM  If 7PM-7AM, please contact night-coverage [email or web address]  This record has been created using Systems analyst. Errors have been sought and corrected,but may not always be located. Such creation errors do not reflect on the standard of care.

## 2020-06-21 NOTE — Progress Notes (Signed)
SLP Cancellation Note  Patient Details Name: Douglas [name] MRN: [id number] DOB: 06-25-1956   Cancelled treatment:       Reason Eval/Treat Not Completed:  (chart reviewed; consulted NSG). Per NSG report, pt is tolerating bites/sips and Pills w/ no overt swallowing difficulties recorded by NSG. Daughter is taking pt home today w/ Palliative/HH therapies then transitioning to Hospice care. Recommend continue an oral diet that is easy to eat/chew, moist foods, and thin liquids via Cup if any coughing/throat clearing noted when using straws when drinking. Recommend Pills in Puree for safer, easier swallowing. Frequent oral care for hygiene. Recommendations given to family/pt at initial evaluation, again to [facility] today. NSG to reconsult ST services if any new needs arise while admitted.      Orinda Kenner, MS, CCC-SLP Speech Language Pathologist Rehab Services [phone] [facility] 06/21/2020, 2:15 PM

## 2020-06-21 NOTE — TOC Progression Note (Signed)
Transition of Care [facility]) - Progression Note    Patient Details  Name: Douglas [name] MRN: [id number] Date of Birth: March 15, 1956  [facility] CM/SW Contact  Shelbie Hutching, RN Phone Number: 06/21/2020, 10:27 AM  Clinical Narrative:    [employer] will be able to deliver equipment today to the home, daughter is aware.  [employer] was unable to give a time but they will call the daughter when they are on their way.    Expected Discharge Plan: [place] Barriers to Discharge: Continued Medical Work up  Expected Discharge Plan and Services Expected Discharge Plan: [place]   Discharge Planning Services: CM Consult Post Acute Care Choice: [place] arrangements for the past 2 months: Single Family Home                           HH Arranged: RN, PT, OT, Nurse's Aide [place] Agency: [place] Date [place]: 06/20/20 Time [place]: [id number] Representative spoke with at [place]: [place] ([place]) Interventions    Readmission Risk Interventions No flowsheet data found.

## 2020-06-21 NOTE — Telephone Encounter (Signed)
[facility] called to report the following: Testing for this patient resulted in a qualified report not a fully comprehensive report. They can be contacted to discuss other options for these results they have also faxed the results.

## 2020-06-21 NOTE — TOC Transition Note (Signed)
Transition of Care [facility]) - CM/SW Discharge Note   Patient Details  Name: MARGUERITE JARBOE MRN: [id number] Date of Birth: Jan 28, 1956  [facility] of [facility]) CM/SW Contact:  Shelbie Hutching, RN Phone Number: 06/21/2020, 2:47 PM   Clinical Narrative:    Patient is ready for discharge today, he will be going home with his daughter.  Alvis Lemmings is aware of discharge home today.  [employer] is delivering equipment to the patient's home.  Daughter will be picking patient up.    Final next level of care: Home w Home Health Services Barriers to Discharge: Barriers Resolved   Patient Goals and CMS Choice Patient states their goals for this hospitalization and ongoing recovery are:: Daughter would like for the patient to return home with home health and OP Palliative CMS Medicare.gov Compare Post Acute Care list provided to:: Patient Represenative (must comment) Choice offered to / list presented to : Adult Children  Discharge Placement                       Discharge Plan and Services   Discharge Planning Services: CM Consult Post Acute Care Choice: Home Health          DME Arranged: 3-N-1, Lightweight manual wheelchair with seat cushion, Hospital bed DME Agency: [employer]       HH Arranged: RN, PT, OT, Nurse's Aide [place] Agency: [place] Date [facility] Agency Contacted: 06/21/20 Time [place]: 1447 Representative spoke with at [place]: [place] ([place]) Interventions     Readmission Risk Interventions No flowsheet data found.

## 2020-06-21 NOTE — Progress Notes (Signed)
OT Cancellation Note  Patient Details Name: Douglas [name] MRN: [id number] DOB: [birth year]   Cancelled Treatment:    Reason Eval/Treat Not Completed: Other (comment). Upon attempt, nursing preparing patient for discharge. Will re-attempt as appropriate should pt not discharge today. CM confirmed pt going home with [facility] services.  Jeni Salles, MPH, MS, OTR/L ascom [phone] 06/21/20, 2:49 PM

## 2020-06-22 NOTE — Telephone Encounter (Signed)
  Do we have the number for [facility] to call?  M

## 2020-06-24 ENCOUNTER — Ambulatory Visit: Payer: Medicare Other

## 2020-06-24 ENCOUNTER — Other Ambulatory Visit: Payer: Self-pay | Admitting: Hospice and Palliative Medicine

## 2020-06-24 ENCOUNTER — Telehealth: Payer: Self-pay | Admitting: *Deleted

## 2020-06-24 MED ORDER — GABAPENTIN 300 MG PO CAPS: 300.0000 mg | ORAL_CAPSULE | Freq: Two times a day (BID) | ORAL | 3 refills | Status: AC

## 2020-06-24 MED ORDER — TRAZODONE HCL 50 MG PO TABS: 25.0000 mg | ORAL_TABLET | Freq: Every evening | ORAL | 2 refills | Status: AC | PRN

## 2020-06-24 NOTE — Progress Notes (Signed)
I spoke with patient's daughter. Home health is following and patient is overall doing well. She requested refill of his gabapentin. She also said that patient is not sleeping well. Generally, he is only getting a few hours of sleep each night. She requested something to help him sleep.   Plan: -Continue home health -Refill gabapentin -Start trazodone 25-50mg  [facility] PRN

## 2020-06-24 NOTE — Telephone Encounter (Signed)
Physical Therapy called to confirm that Dr. Mike Gip will be the physician for this patient. Patient told therapist the Dr. Mike Gip is handling most of his medical problems. Please let Herbert Deaner know.

## 2020-06-24 NOTE — Telephone Encounter (Signed)
It is under the visit information tab on the phone note. [phone]. [name] was the name of the person that called.

## 2020-06-25 ENCOUNTER — Telehealth: Payer: Self-pay | Admitting: Pulmonary Disease

## 2020-06-25 ENCOUNTER — Ambulatory Visit: Payer: Medicare Other

## 2020-06-25 NOTE — Telephone Encounter (Signed)
We have the CMN for Dr. Patsey Berthold to sign she will be in clinic tomorrow

## 2020-06-25 NOTE — Telephone Encounter (Signed)
Please advise 

## 2020-06-26 ENCOUNTER — Ambulatory Visit: Payer: Medicare Other

## 2020-06-26 ENCOUNTER — Inpatient Hospital Stay: Payer: Medicare Other

## 2020-06-26 ENCOUNTER — Encounter: Payer: Self-pay | Admitting: Hematology and Oncology

## 2020-06-27 ENCOUNTER — Ambulatory Visit: Payer: Medicare Other

## 2020-06-27 NOTE — Telephone Encounter (Signed)
Douglas [name], please advise if this was signed. Thanks.

## 2020-06-27 NOTE — Telephone Encounter (Signed)
No not yet Dr. Patsey Berthold has been seeing patients all day yesterday and today

## 2020-06-28 ENCOUNTER — Ambulatory Visit: Payer: Medicare Other

## 2020-06-28 ENCOUNTER — Other Ambulatory Visit: Payer: Self-pay | Admitting: *Deleted

## 2020-06-28 ENCOUNTER — Encounter: Payer: Self-pay | Admitting: Radiation Oncology

## 2020-06-28 DIAGNOSIS — C7951 Secondary malignant neoplasm of bone: Secondary | ICD-10-CM

## 2020-06-28 NOTE — Telephone Encounter (Signed)
CMN has been faxed to [facility].

## 2020-07-01 ENCOUNTER — Ambulatory Visit: Payer: Medicare Other

## 2020-07-01 ENCOUNTER — Inpatient Hospital Stay (HOSPITAL_BASED_OUTPATIENT_CLINIC_OR_DEPARTMENT_OTHER): Payer: Medicare Other | Admitting: Hospice and Palliative Medicine

## 2020-07-01 ENCOUNTER — Telehealth: Payer: Self-pay | Admitting: Primary Care

## 2020-07-01 ENCOUNTER — Ambulatory Visit
Admission: RE | Admit: 2020-07-01 | Discharge: 2020-07-01 | Disposition: A | Discharge status: Home / Self Care | Payer: Medicare Other | Source: Ambulatory Visit | Attending: Radiation Oncology | Admitting: Radiation Oncology

## 2020-07-01 ENCOUNTER — Other Ambulatory Visit: Payer: Self-pay

## 2020-07-01 VITALS — BP 108/64 | HR 79 | Temp 96.9°F

## 2020-07-01 DIAGNOSIS — Z515 Encounter for palliative care: Secondary | ICD-10-CM | POA: Diagnosis not present

## 2020-07-01 DIAGNOSIS — C3411 Malignant neoplasm of upper lobe, right bronchus or lung: Secondary | ICD-10-CM | POA: Diagnosis present

## 2020-07-01 DIAGNOSIS — G893 Neoplasm related pain (acute) (chronic): Secondary | ICD-10-CM

## 2020-07-01 DIAGNOSIS — C7951 Secondary malignant neoplasm of bone: Secondary | ICD-10-CM | POA: Diagnosis not present

## 2020-07-01 DIAGNOSIS — J449 Chronic obstructive pulmonary disease, unspecified: Secondary | ICD-10-CM | POA: Diagnosis not present

## 2020-07-01 DIAGNOSIS — C3490 Malignant neoplasm of unspecified part of unspecified bronchus or lung: Secondary | ICD-10-CM

## 2020-07-01 DIAGNOSIS — R634 Abnormal weight loss: Secondary | ICD-10-CM | POA: Diagnosis not present

## 2020-07-01 DIAGNOSIS — F17210 Nicotine dependence, cigarettes, uncomplicated: Secondary | ICD-10-CM | POA: Diagnosis not present

## 2020-07-01 DIAGNOSIS — Z8616 Personal history of COVID-19: Secondary | ICD-10-CM | POA: Diagnosis not present

## 2020-07-01 DIAGNOSIS — C7972 Secondary malignant neoplasm of left adrenal gland: Secondary | ICD-10-CM | POA: Diagnosis not present

## 2020-07-01 DIAGNOSIS — R1310 Dysphagia, unspecified: Secondary | ICD-10-CM | POA: Diagnosis not present

## 2020-07-01 DIAGNOSIS — Z66 Do not resuscitate: Secondary | ICD-10-CM | POA: Diagnosis not present

## 2020-07-01 DIAGNOSIS — C7971 Secondary malignant neoplasm of right adrenal gland: Secondary | ICD-10-CM | POA: Diagnosis not present

## 2020-07-01 DIAGNOSIS — Z9981 Dependence on supplemental oxygen: Secondary | ICD-10-CM | POA: Diagnosis not present

## 2020-07-01 NOTE — Progress Notes (Signed)
[facility]  [address] [place], [place] [address] Phone: [phone]  Fax: [phone]   Clinic Day:  07/02/2020  Referring physician: No ref. provider found  Chief Complaint: Douglas [name] is a 64 y.o. male with metastatic adenocarcinoma of the lung who is seen for reassessment after interval hospitalization.  HPI: The patient was last seen in the medical oncology clinic on 05/30/2020.  At that time, work-up was reviewed.  He was felt likely to have a T1cN3M1c adenocarcinoma of the lung.  Additional IHC studies were pending.  PDL1 was 0%.  Foundation One was pending.  He had tiny CNS metastasis.There was metastasis involving the cervical spine and affecting the C5-6 epidural spce.  We discussed consideration of radiation to painful at risk lesions.  He was scheduled to see Dr Baruch Gouty.  We discussed systemic treatment based on the primary site and driver mutations.  Delton See was discussed.  Pain was managed with OxyContin 10 mg q 12 hours and oxycodone 5 mg q 4 hours prn.  He was to follow-up with GI regarding his dysphagia.  He was admitted to [facility] from 06/08/2020- 06/12/2020 with progressive shortness of breath and weakness.  He was COVID-19 positive.  He was treated with oxygen, IV steroids, remdesvir x 5 days and baricitinib.  He was discharged on 5-6 liters of oxygen and Decadron 6 mg/day x 1 week.  He was admitted to [facility] from 06/17/2020 - 06/21/2020 with altered mental status and an acute left cerebral infarct.  He was seen in the ER.  He had expressive aphasia.  He was agitated and confused.  We discussed reassessment in 2 weeks given his poor performance status.  He was seen by Altha Harm, NP.  Hospice was deferrred.  He was discharged home with home health.  Systemic treatment was being considered if he improved.  Head MRI on 06/17/2020 revealed patchy acute to subacute left cerebral hemispheric infarcts primarily in the MCA  territory.  There was an unchanged 3 mm enhancing focus in the right cerebellum, possibly a metastasis vs a subacute infarct.  There was nonvisualization of the punctate enhancing focus in the left cerebellum on the prior MRI.  There were no evidence of new intracranial metastases.  There were unchanged small enhancing calvarial lesions.  He was seen by Altha Harm on 07/01/2020.  He was feeling better since discharge.  Performance status remained poor.  He required assit on ambulation.  He remained on oxygen 5-6 liters/min (oxygen saturation mid to high 90s on 2 liters/min).  Radiation was scheduled to begin.  He was on OxyContin 10 mg every 8 hours and oxycodone 5-10 mg every 4 hours as needed for breakthrough pain.  Foundation One revealed KRAS I34V, QQVZ5G splice site [id number], KEAP1 R601L, STK11 L85*, CDKN2A/B CDKN2A loss exon 2-3.  There were no alterations in ALK, BRAF, EGFR, ERBB2, MET, RET, ROS1.  TNB was 8 muts/Mb.  He met with Dr. Baruch Gouty on 07/01/2020; plan is to start with 10 radiation treatments.   Symptomatically, he feels slightly worse. His wife notes that his legs have been swelling and she is concerned about his pulse dropping in the night; when she checked him with a pulse-ox in the middle of the night, his pulse dropped to 36.   He is fairly unchanged. He continues to receive physical therapy. He needs help getting dressed, bathing, making meals, and getting around. He does need some assistance eating, but not regularly. He continues to have  pain, generally around his right shoulder, trapezius, pectoralis major, and/or neck.   When asked for the date, he said he was not able to get recall a month. He thought it was the 27th. He did not know the year. He did not know his address. He did not know his phone number. He did not know the president of the [place]. He kept answering twenty-seventh for several answers. He was asked to remember three words (blue, seven,  different); he said the three words were blue, red, and green when asked several minutes later.   He is adamant about receiving treatment and wants to get better.   Past Medical History:  Diagnosis Date  . Cancer ([place])    lung with brain mets  . COPD (chronic obstructive pulmonary disease) ([place])   . Hyperlipidemia   . Hypertension   . Lesion of adrenal gland ([place])   . Lung mass     Past Surgical History:  Procedure Laterality Date  . LUNG BIOPSY  August '21    Family History  Problem Relation Age of Onset  . Cancer Neg Hx     Social History:  reports that he has been smoking cigarettes. He started smoking about 45 years ago. He has a 22.50 pack-year smoking history. He has never used smokeless tobacco. He reports current alcohol use.  Drug: Marijuana. He smoked 1 pack a day for 50 years.  He stopped smoking 2 weeks ago.  The patient denies any exposure to radiation or toxins.  He is retired.  He used to cut down trees.  The patient lives in [place]. He has son ("doc") and a daughter Douglas [name]). The patient is accompanied by his daughter, Douglas [name], in person, and his son, Doc, via telephone today.   Allergies: No Known Allergies  Current Medications: Current Outpatient Medications  Medication Sig Dispense Refill  . albuterol (VENTOLIN HFA) 108 (90 Base) MCG/ACT inhaler Inhale 1-2 puffs into the lungs every 6 (six) hours as needed for wheezing or shortness of breath. 6.7 g 0  . aspirin EC 325 MG EC tablet Take 1 tablet (325 mg total) by mouth daily. 30 tablet 0  . atorvastatin (LIPITOR) 40 MG tablet Take 1 tablet (40 mg total) by mouth every evening. 90 tablet 1  . Budeson-Glycopyrrol-Formoterol (BREZTRI AEROSPHERE) 160-9-4.8 MCG/ACT AERO Inhale 2 puffs into the lungs in the morning and at bedtime. 2 g 0  . gabapentin (NEURONTIN) 300 MG capsule Take 1 capsule (300 mg total) by mouth 2 (two) times daily. 1 capsule 3  . losartan-hydrochlorothiazide (HYZAAR) 50-12.5 MG tablet Take 1  tablet by mouth daily.    Marland Kitchen oxyCODONE (OXY IR/ROXICODONE) 5 MG immediate release tablet Take 1-2 tablets (5-10 mg total) by mouth every 4 (four) hours as needed for severe pain. 60 tablet 0  . oxyCODONE (OXYCONTIN) 10 mg 12 hr tablet Take 1 tablet (10 mg total) by mouth every 8 (eight) hours. 60 tablet 0  . pantoprazole (PROTONIX) 40 MG tablet Take 1 tablet (40 mg total) by mouth daily. 90 tablet 0  . traZODone (DESYREL) 50 MG tablet Take 0.5-1 tablets (25-50 mg total) by mouth at bedtime as needed for sleep. 30 tablet 2   No current facility-administered medications for this visit.    Review of Systems  Constitutional: Positive for weight loss (7 lbs). Negative for chills, diaphoresis, fever and malaise/fatigue.       Spends the day "awake and not in bed".  ADLs require assistance.  HENT: Negative for  congestion, ear discharge, ear pain, hearing loss, nosebleeds, sinus pain, sore throat and tinnitus.   Eyes: Negative for blurred vision.  Respiratory: Positive for cough, sputum production (phlegm) and shortness of breath. Negative for hemoptysis.        COPD. On oxygen 3 liters/min via [place].  Cardiovascular: Positive for chest pain (rib cage). Negative for palpitations, orthopnea, claudication and leg swelling.  Gastrointestinal: Negative for abdominal pain, blood in stool, constipation, diarrhea (resolved), heartburn, melena, nausea and vomiting.       No appetite. Drinking Gatorade, water and chocolate milk. Severe dysphagia.  Genitourinary: Negative for dysuria, frequency, hematuria and urgency.  Musculoskeletal: Positive for back pain (lumbar spine), joint pain (shoulder) and neck pain. Negative for myalgias.  Skin: Negative for itching and rash.  Neurological: Positive for weakness (generalized). Negative for dizziness, tingling, sensory change, focal weakness and headaches.       S/p CVA.  Endo/Heme/Allergies: Bruises/bleeds easily (forearm bruising).  Psychiatric/Behavioral: Positive  for memory loss. Negative for depression. The patient is nervous/anxious. The patient does not have insomnia.   All other systems reviewed and are negative.  Performance status (ECOG):  3  Vitals Blood pressure (!) 91/58, pulse 74, resp. rate 18, weight 132 lb (59.9 kg), SpO2 100 %.   Physical Exam Vitals and nursing note reviewed.  Constitutional:      General: He is not in acute distress.    Appearance: Normal appearance.     Interventions: Face mask in place.  HENT:     Head: Normocephalic and atraumatic.     Comments: Dark shoulder length hair.    Nose:     Comments:  in place.    Mouth/Throat:     Mouth: Mucous membranes are moist.     Pharynx: Oropharynx is clear.  Eyes:     Extraocular Movements: Extraocular movements intact.     Conjunctiva/sclera: Conjunctivae normal.     Pupils: Pupils are equal, round, and reactive to light.     Comments: Brown/hazel eyes.  Cardiovascular:     Rate and Rhythm: Normal rate and regular rhythm.     Pulses: Normal pulses.     Heart sounds: Normal heart sounds. No murmur heard.   Pulmonary:     Effort: Pulmonary effort is normal. No respiratory distress.     Breath sounds: Wheezing present. No rales.  Chest:     Chest wall: No tenderness.  Abdominal:     General: Bowel sounds are normal. There is no distension.     Palpations: Abdomen is soft. There is no hepatomegaly, splenomegaly or mass.     Tenderness: There is no abdominal tenderness. There is no guarding.  Musculoskeletal:        General: Tenderness (cervical spine and lumbar/sacral spine) present. No swelling. Normal range of motion.     Cervical back: Normal range of motion and neck supple. Tenderness (severe) present.     Right lower leg: Edema (ankles) present.     Left lower leg: Edema (ankles) present.  Lymphadenopathy:     Head:     Right side of head: No preauricular, posterior auricular or occipital adenopathy.     Left side of head: No preauricular, posterior  auricular or occipital adenopathy.     Cervical: No cervical adenopathy.     Upper Body:     Right upper body: No supraclavicular or axillary adenopathy.     Left upper body: No supraclavicular or axillary adenopathy.     Lower Body: No right inguinal  adenopathy. No left inguinal adenopathy.  Skin:    General: Skin is warm and dry.     Coloration: Skin is not jaundiced.     Findings: No rash.  Neurological:     Mental Status: He is alert. Mental status is at baseline.     Cranial Nerves: No cranial nerve deficit.     Sensory: No sensory deficit.     Motor: Weakness (general; R>L) present.  Psychiatric:        Mood and Affect: Mood normal.        Behavior: Behavior normal.        Thought Content: Thought content normal.        Cognition and Memory: He exhibits impaired recent memory and impaired remote memory.        Judgment: Judgment normal.    No visits with results within 3 Day(s) from this visit.  Latest known visit with results is:  Admission on 06/17/2020, Discharged on 06/21/2020  Component Date Value Ref Range Status  . Prothrombin Time 06/17/2020 12.9  11.4 - 15.2 seconds Final  . INR 06/17/2020 1.0  0.8 - 1.2 Final   Comment: (NOTE) INR goal varies based on device and disease states. Performed at [facility], [address], [place], [place] [address]   . aPTT 06/17/2020 24  24 - 36 seconds Final   Performed at [facility], [address] [place], [place] [address]  . WBC 06/17/2020 16.0* 4.0 - 10.5 K/uL Final  . RBC 06/17/2020 3.60* 4.22 - 5.81 MIL/uL Final  . Hemoglobin 06/17/2020 10.7* 13.0 - 17.0 g/dL Final  . HCT 06/17/2020 32.9* 39 - 52 % Final  . MCV 06/17/2020 91.4  80.0 - 100.0 fL Final  . MCH 06/17/2020 29.7  26.0 - 34.0 pg Final  . MCHC 06/17/2020 32.5  30.0 - 36.0 g/dL Final  . RDW 06/17/2020 13.7  11.5 - 15.5 % Final  . Platelets 06/17/2020 461* 150 - 400 K/uL Final  . nRBC 06/17/2020 0.0  0.0 - 0.2 % Final   Performed at  [facility], [address] [place], [place] [address]  . Neutrophils Relative % 06/17/2020 82  % Final  . Neutro Abs 06/17/2020 13.3* 1.7 - 7.7 K/uL Final  . Lymphocytes Relative 06/17/2020 8  % Final  . Lymphs Abs 06/17/2020 1.2  0.7 - 4.0 K/uL Final  . Monocytes Relative 06/17/2020 8  % Final  . Monocytes Absolute 06/17/2020 1.2* 0 - 1 K/uL Final  . Eosinophils Relative 06/17/2020 1  % Final  . Eosinophils Absolute 06/17/2020 0.2  0 - 0 K/uL Final  . Basophils Relative 06/17/2020 0  % Final  . Basophils Absolute 06/17/2020 0.0  0 - 0 K/uL Final  . Immature Granulocytes 06/17/2020 1  % Final  . Abs Immature Granulocytes 06/17/2020 0.15* 0.00 - 0.07 K/uL Final   Performed at [facility], [address], [place], [place] [address]  . Sodium 06/17/2020 139  135 - 145 mmol/L Final  . Potassium 06/17/2020 3.8  3.5 - 5.1 mmol/L Final  . Chloride 06/17/2020 94* 98 - 111 mmol/L Final  . CO2 06/17/2020 33* 22 - 32 mmol/L Final  . Glucose, Bld 06/17/2020 98  70 - 99 mg/dL Final   Glucose reference range applies only to samples taken after fasting for at least 8 hours.  . BUN 06/17/2020 15  8 - 23 mg/dL Final  . Creatinine, Ser 06/17/2020 1.05  0.61 - 1.24 mg/dL Final  .  Calcium 06/17/2020 8.9  8.9 - 10.3 mg/dL Final  . Total Protein 06/17/2020 6.9  6.5 - 8.1 g/dL Final  . Albumin 06/17/2020 3.3* 3.5 - 5.0 g/dL Final  . AST 06/17/2020 23  15 - 41 U/L Final  . ALT 06/17/2020 24  0 - 44 U/L Final  . Alkaline Phosphatase 06/17/2020 68  38 - 126 U/L Final  . Total Bilirubin 06/17/2020 0.8  0.3 - 1.2 mg/dL Final  . GFR calc non Af Amer 06/17/2020 >60  >60 mL/min Final  . GFR calc Af Amer 06/17/2020 >60  >60 mL/min Final  . Anion gap 06/17/2020 12  5 - 15 Final   Performed at [facility], [address] [place], [place] [address]  . Color, Urine 06/17/2020 STRAW* YELLOW Final  . APPearance 06/17/2020 CLEAR* CLEAR Final  . Specific Gravity, Urine 06/17/2020 1.006   1.005 - 1.030 Final  . pH 06/17/2020 7.0  5.0 - 8.0 Final  . Glucose, UA 06/17/2020 NEGATIVE  NEGATIVE mg/dL Final  . Hgb urine dipstick 06/17/2020 NEGATIVE  NEGATIVE Final  . Bilirubin Urine 06/17/2020 NEGATIVE  NEGATIVE Final  . Ketones, ur 06/17/2020 NEGATIVE  NEGATIVE mg/dL Final  . Protein, ur 06/17/2020 NEGATIVE  NEGATIVE mg/dL Final  . Nitrite 06/17/2020 NEGATIVE  NEGATIVE Final  . Chalmers Guest 06/17/2020 NEGATIVE  NEGATIVE Final  . RBC / HPF 06/17/2020 0-5  0 - 5 RBC/hpf Final  . WBC, UA 06/17/2020 NONE SEEN  0 - 5 WBC/hpf Final  . Bacteria, UA 06/17/2020 NONE SEEN  NONE SEEN Final  . Squamous Epithelial / LPF 06/17/2020 0-5  0 - 5 Final   Performed at [facility], [address], [place], [place] [address]  . Magnesium 06/17/2020 2.3  1.7 - 2.4 mg/dL Final   Performed at [facility], [address] [place], [place] [address]  . TSH 06/17/2020 1.628  0.350 - 4.500 uIU/mL Final   Comment: Performed by a 3rd Generation assay with a functional sensitivity of <=0.01 uIU/mL. Performed at [facility], [address], [place], [place] [address]   . Troponin I (High Sensitivity) 06/17/2020 9  <18 ng/L Final   Comment: (NOTE) Elevated high sensitivity troponin I (hsTnI) values and significant  changes across serial measurements may suggest ACS but many other  chronic and acute conditions are known to elevate hsTnI results.  Refer to the "Links" section for chest pain algorithms and additional  guidance. Performed at [facility], [address], [place], [place] [address]   . Procalcitonin 06/17/2020 <0.10  ng/mL Final   Comment:        Interpretation: PCT (Procalcitonin) <= 0.5 ng/mL: Systemic infection (sepsis) is not likely. Local bacterial infection is possible. (NOTE)       Sepsis PCT Algorithm           Lower Respiratory Tract                                      Infection PCT Algorithm    ----------------------------      ----------------------------         PCT < 0.25 ng/mL                PCT < 0.10 ng/mL          Strongly encourage             Strongly discourage   discontinuation  of antibiotics    initiation of antibiotics    ----------------------------     -----------------------------       PCT 0.25 - 0.50 ng/mL            PCT 0.10 - 0.25 ng/mL               OR       >80% decrease in PCT            Discourage initiation of                                            antibiotics      Encourage discontinuation           of antibiotics    ----------------------------     -----------------------------         PCT >= 0.50 ng/mL              PCT 0.26 - 0.50 ng/mL               AND                                 <80% decrease in PCT             Encourage initiation of                                             antibiotics       Encourage continuation           of antibiotics    ----------------------------     -----------------------------        PCT >= 0.50 ng/mL                  PCT > 0.50 ng/mL               AND         increase in PCT                  Strongly encourage                                      initiation of antibiotics    Strongly encourage escalation           of antibiotics                                     -----------------------------                                           PCT <= 0.25 ng/mL                                                 OR                                        >  80% decrease in PCT                                      Discontinue / Do not initiate                                             antibiotics  Performed at [facility], [address] [place], [place] [address]   . Sodium 06/18/2020 140  135 - 145 mmol/L Final  . Potassium 06/18/2020 4.4  3.5 - 5.1 mmol/L Final  . Chloride 06/18/2020 98  98 - 111 mmol/L Final  . CO2 06/18/2020 31  22 - 32 mmol/L Final  . Glucose, Bld 06/18/2020 118* 70 - 99 mg/dL Final   Glucose reference  range applies only to samples taken after fasting for at least 8 hours.  . BUN 06/18/2020 15  8 - 23 mg/dL Final  . Creatinine, Ser 06/18/2020 0.95  0.61 - 1.24 mg/dL Final  . Calcium 06/18/2020 8.8* 8.9 - 10.3 mg/dL Final  . GFR calc non Af Amer 06/18/2020 >60  >60 mL/min Final  . GFR calc Af Amer 06/18/2020 >60  >60 mL/min Final  . Anion gap 06/18/2020 11  5 - 15 Final   Performed at [facility], [address] [place], [place] [address]  . WBC 06/18/2020 8.9  4.0 - 10.5 K/uL Final  . RBC 06/18/2020 3.37* 4.22 - 5.81 MIL/uL Final  . Hemoglobin 06/18/2020 10.0* 13.0 - 17.0 g/dL Final  . HCT 06/18/2020 30.9* 39 - 52 % Final  . MCV 06/18/2020 91.7  80.0 - 100.0 fL Final  . MCH 06/18/2020 29.7  26.0 - 34.0 pg Final  . MCHC 06/18/2020 32.4  30.0 - 36.0 g/dL Final  . RDW 06/18/2020 13.8  11.5 - 15.5 % Final  . Platelets 06/18/2020 414* 150 - 400 K/uL Final  . nRBC 06/18/2020 0.0  0.0 - 0.2 % Final   Performed at [facility], [address] [place],  [address]  . Cholesterol 06/18/2020 124  0 - 200 mg/dL Final  . Triglycerides 06/18/2020 126  <150 mg/dL Final  . HDL 06/18/2020 58  >40 mg/dL Final  . Total CHOL/HDL Ratio 06/18/2020 2.1  RATIO Final  . VLDL 06/18/2020 25  0 - 40 mg/dL Final  . LDL Cholesterol 06/18/2020 41  0 - 99 mg/dL Final   Comment:        Total Cholesterol/HDL:CHD Risk Coronary Heart Disease Risk Table                     Men   Women  1/2 Average Risk   3.4   3.3  Average Risk       5.0   4.4  2 X Average Risk   9.6   7.1  3 X Average Risk  23.4   11.0        Use the calculated Patient Ratio above and the CHD Risk Table to determine the patient's CHD Risk.        ATP III CLASSIFICATION (LDL):  <100     mg/dL   Optimal  100-129  mg/dL   Near or Above  Optimal  130-159  mg/dL   Borderline  160-189  mg/dL   High  >190     mg/dL   Very High Performed at [facility], [address] [place], [place]  [address]   . Weight 06/18/2020 2,402.13  oz Final  . Height 06/18/2020 65  in Final  . BP 06/18/2020 150/99  mmHg Final  . Ao pk vel 06/18/2020 0.83  m/s Final  . AV Area VTI 06/18/2020 3.10  cm2 Final  . AR max vel 06/18/2020 3.10  cm2 Final  . AV Mean grad 06/18/2020 1.0  mmHg Final  . AV Peak grad 06/18/2020 2.7  mmHg Final  . AV Area mean vel 06/18/2020 3.65  cm2 Final  . Area-P 1/2 06/18/2020 2.32  cm2 Final  . FIO2 06/18/2020 0.60   Final   PATIENT ON 1OL HFNC  . pH, Arterial 06/18/2020 7.39  7.35 - 7.45 Final  . pCO2 arterial 06/18/2020 50* 32 - 48 mmHg Final  . pO2, Arterial 06/18/2020 223* 83 - 108 mmHg Final  . Bicarbonate 06/18/2020 30.3* 20.0 - 28.0 mmol/L Final  . Acid-Base Excess 06/18/2020 4.6* 0.0 - 2.0 mmol/L Final  . O2 Saturation 06/18/2020 99.8  % Final  . Patient temperature 06/18/2020 37.0   Final  . Collection site 06/18/2020 RIGHT RADIAL   Final  . Sample type 06/18/2020 ARTERIAL DRAW   Final  . Allens test (pass/fail) 06/18/2020 PASS  PASS Final   Performed at [facility], [address] [place], [place] [address]  . Ammonia 06/19/2020 18  9 - 35 umol/L Final   Performed at [facility], [address] [place], [place] [address]  . CRP 06/19/2020 0.9  <1.0 mg/dL Final   Performed at [place] [address] [place], [place] [address]    Assessment:  Douglas [name] is a 64 y.o. male with metastatic adenocarcinoma s/p right sacral ala biopsy on 05/27/2020. Pathology revealed adenocarcinoma of unknown primary.   Tumor was negative for TTF-1, Napsin A, CK20, GATA3, p40, CDX2, PSA and PSA-P.  Findings were compatible with poorly differentiated CK7 positive adenocarcinoma.  Together with the patient's radiographic findings, features are highly concerning for metastatic pulmonary adenocarcinoma. CEA was 207.0 on 06/04/2020.  Foundation One revealed KRAS S85I, OEVO3J splice site [id number], KEAP1 R601L, STK11 L85*, CDKN2A/B  CDKN2A loss exon 2-3.  There were no alterations in ALK, BRAF, EGFR, ERBB2, MET, RET, ROS1.  TNB was 8 muts/Mb.  PDL1 was 0%.  Chest CT angiogram on 05/16/2020 revealed no evidence of pulmonary embolism.  There was a 2.1 x 1.7 cm spiculated nodule in the right lung apex suspicious for bronchogenic carcinoma.  In addition there was a 2.7 x 1.5 cm heterogeneously enhancing lesion in the right adrenal gland concerning for metastatic disease.  There was diffuse circumferential thickening of the thoracic esophagus likely correlating with the symptoms of esophagitis.  PET scan on 05/22/2020 revealed a hypermetabolic spiculated 1.9 x 2.2 cm (SUV 10.9) right upper lobe nodule with hypermetabolic bilateral mediastinal and hilar lymph nodes (5-7 mm; SUV 3.1 - 4.0), bilateral adrenal lesions (right 1.5 x 2.5 cm; SUV 28.6; left SUV 5.6) and lytic osseous lesions (cervical spine, right scapula (2.9 x 3.4 cm; SUV 41.3), left 10th rib, L3, right sacrum, right pubic bone) c/w stage IV primary bronchogenic carcinoma.  There was focal hypermetabolism in the sellar region, without a definite CT correlate. There was new peribronchovascular ground-glass in the superior segment left lower  lobe, likely infectious or inflammatory in etiology.  There was aortic atherosclerosis, coronary artery calcification, and emphysema.  Barium swallow on 05/17/2020 revealed mild mucosal thickening and irregularity without ulceration.  There was mild gastroesophageal reflux and no esophageal stricture.  Head MRI on 05/29/2020 revealed sub-5 mm bilateral cerebellar enhancing foci concerning for metastases. There was sub-5 mm right parietal bone and basilar occipital enhancing foci, concerning for osseous metastases. There was sequela of remote left thalamic and right midbrain insults. There was mild cerebral atrophy and chronic microvascular ischemic changes  Cervical spine MRI on 05/29/2020 revealed metastatic soft tissue involving the left  C5-6 facet joint and right anterolateral C5 vertebral body. There was extension of tumor into the prevertebral and circumferential epidural space at the C5-6 level without significant spinal canal narrowing. Tumor also extends to involve the left C5-6 and C6-7 transverse and neural foramen with partial encasement of the vertebral artery.  There was no evidence of spinal cord enhancement.  Lumbar spine MRI on 05/29/2020 revealed L3 metastatic lesion and pathologic fracture with approximately 20% height loss. There was extension of tumor into the prevertebral and anterior epidural space at the L3 level with mild spinal canal and bilateral neural foraminal narrowing.  There was partially imaged 4.2 cm right lumbosacral junction/SI joint metastasis with extension of tumor into the right L4-5 neural foramen. There was possible associated pathologic fracture.  Sacrum SI joint MRI on 05/29/2020 revealed a 4.4 x 2.5 x 4.7 cm destructive enhancing mass in the right sacrum and a 3.1 x 1.0 x 4.8 cm posterior right ilium mass with impingement on the right L5 root in the periphery of the foramen and beyond the foramen.  He was admitted to [facility] from 06/08/2020- 06/12/2020 with progressive shortness of breath and weakness.  He was COVID-19 positive.  He was treated with oxygen, IV steroids, remdesvir x 5 days and baricitinib.  He was discharged on 5-6 liters of oxygen and Decadron 6 mg/day x 1 week.  He was admitted to [facility] from 06/17/2020 - 06/21/2020 with altered mental status and an acute left cerebral infarct.  Head MRI on 06/17/2020 revealed patchy acute to subacute left cerebral hemispheric infarcts primarily in the MCA territory.  There was an unchanged 3 mm enhancing focus in the right cerebellum, possibly a metastasis vs a subacute infarct.  There was nonvisualization of the punctate enhancing focus in the left cerebellum on the prior MRI.  There were no evidence of new intracranial metastases.   There were unchanged small enhancing calvarial lesions.  Symptomatically, performance status remains poor.  He requires assistance with his ADLs.  He is inactive.  Memory is poor.  Plan: 1.   Metastatic adenocarcinoma Patient has metastatic lung cancer (stage [id number]).             Chest CT on 05/16/2020 revealed a 2.1 cm right upper lobe mass and a 2.7 cm right adrenal lesion. PET scan on 05/22/2020 revealed a 2.2 cm RUL hypermetabolic lesion with associated bilateral mediastinal nodes.                         There are bilateral adrenal lesions and multiple lytic bone lesions. Sacral ala biopsy confirmed adenocarcinoma (unknown primary) but c/w lung cancer.                         Foundation One reveals no targettable lesion.  Patient is interested in pursuing treatment.  Discuss consideration of single agent Alimta if performance status improves.   Potential side effects reviewed.  Information provided.   B12 injection today.  Begin folic acid 1 mg a day.             Patient not interested in Hospice or palliative care.    2.   Bone metastasis             Patient has metastasis involving left C5-6 facet joint, right C5 vertebral body, C5-6 epidural space and C6-7 transverse and neuroforamen.                         He has involvement of the SI joint and right sacrum.             Patient seen for palliative radiation.    Discuss plan to complete radiation prior to initiation of chemotherapy.             Review plan for Xgeva to prevent bone related events.             Continue OxyContin 10 mg p.o. every 12 hours and oxycodone 5 mg every 4 hours as needed pain. 3. Dysphagia Etiology of circumferential thickening of the esophagus likely related to esophagitis. Patient has had significant dysphagia despite Protonix.             GI previously consulted. 4.   Weight loss             Weight down 7 pounds.             Patient with  dysphagia but able to tolerate liquids.             Await upper endoscopy by GI. 5.   Code status             DNR/DNI code status.             MOST form completed.  Obtain forms for living will and medical power of attorney. 6.   B12 injection today. 7.   Patient's family needs to talk with Elease Etienne (social work). 8.   Samara Deist. 9.   Preauth Alimta. 10.   RTC in 1 week for MD assessment, labs (CBC with diff, CMP), and finalization of treatment plan.  I discussed the assessment and treatment plan with the patient.  The patient was provided an opportunity to ask questions and all were answered.  The patient agreed with the plan and demonstrated an understanding of the instructions.  The patient was advised to call back if the symptoms worsen or if the condition fails to improve as anticipated.   Miryah Ralls C. Mike Gip, MD, PhD    07/02/2020, 9:07 PM  I, Jacqualyn Posey, am acting as a Education administrator for [employer]. Mike Gip, MD.   I, Samiha Denapoli C. Mike Gip, MD, have reviewed the above documentation for accuracy and completeness, and I agree with the above.

## 2020-07-01 NOTE — Telephone Encounter (Signed)
Spoke with patient's daughter, Luellen Pucker, regarding Palliative services and all questions were answered and she was in agreement with scheduling visit with NP.  I have scheduled an In-person Consult for 07/09/20 @ 8:30 AM.

## 2020-07-01 NOTE — Progress Notes (Signed)
[place]  [phone] [phone]   Name: Douglas [name] Date: 07/01/2020 MRN: [id number]  DOB: Oct 16, 1955  Patient Care Team: Patient, No Pcp Per as PCP - General (General Practice) [name], MD as Referring Physician (Hematology and Oncology) Noreene Filbert, MD as Referring Physician (Radiation Oncology) Lucilla Lame, MD as Consulting Physician (Gastroenterology)    REASON FOR CONSULTATION: Douglas [name] is a 64 y.o. male with multiple medical problems including COPD, tobacco abuse, hypertension, hyperlipidemia, who was hospitalized 05/16/2020-05/17/2020 with dysphagia and unintentional weight loss. Patient was found to have a right lung mass, right adrenal lesion, and diffuse circumferential esophageal thickening on CT. Barium swallow on 05/17/2020 revealed reflux and esophagitis. 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  Patient was hospitalized 06/08/2020 -06/12/2020 with Covid pneumonia.  He was discharged requiring 5 L oxygen.  Patient was readmitted 06/17/2020 -06/21/2020 with CVA.  Patient has been severely symptomatic with pain, weight loss, and poor appetite. He is referred to palliative care to help address goals and manage ongoing symptoms.  SOCIAL HISTORY:     reports that he has been smoking cigarettes. He started smoking about 45 years ago. He has a 22.50 pack-year smoking history. He has never used smokeless tobacco. He reports current alcohol use.  Drug: Marijuana.   Patient is a widower.  He lives with his daughter.  Patient also has a son who is involved.  Patient previously worked with a tree service.  ADVANCE DIRECTIVES:  Not on file  CODE STATUS: DNR/DNI (MOST form completed on 05/24/2020)  PAST  MEDICAL HISTORY: Past Medical History:  Diagnosis Date   Cancer ([name])    lung with brain mets   COPD (chronic obstructive pulmonary disease) ([place])    Hyperlipidemia    Hypertension    Lesion of adrenal gland ([place])    Lung mass     PAST SURGICAL HISTORY:  Past Surgical History:  Procedure Laterality Date   LUNG BIOPSY  August '21    HEMATOLOGY/ONCOLOGY HISTORY:  Oncology History   No history exists.    ALLERGIES:  has No Known Allergies.  MEDICATIONS:  Current Outpatient Medications  Medication Sig Dispense Refill   albuterol (VENTOLIN HFA) 108 (90 Base) MCG/ACT inhaler Inhale 1-2 puffs into the lungs every 6 (six) hours as needed for wheezing or shortness of breath. 6.7 g 0   aspirin EC 325 MG EC tablet Take 1 tablet (325 mg total) by mouth daily. 30 tablet 0   atorvastatin (LIPITOR) 40 MG tablet Take 1 tablet (40 mg total) by mouth every evening. 90 tablet 1   Budeson-Glycopyrrol-Formoterol (BREZTRI AEROSPHERE) 160-9-4.8 MCG/ACT AERO Inhale 2 puffs into the lungs in the morning and at bedtime. 2 g 0   gabapentin (NEURONTIN) 300 MG capsule Take 1 capsule (300 mg total) by mouth 2 (two) times daily. 1 capsule 3   losartan-hydrochlorothiazide (HYZAAR) 50-12.5 MG tablet Take 1 tablet by mouth daily.     oxyCODONE (OXY IR/ROXICODONE) 5 MG immediate release tablet Take 1-2 tablets (5-10 mg total) by mouth every 4 (four) hours as needed for severe pain. 60 tablet 0   oxyCODONE (OXYCONTIN) 10 mg 12 hr tablet Take 1 tablet (10 mg total) by mouth every 8 (eight) hours. 60 tablet 0   pantoprazole (PROTONIX) 40 MG tablet Take 1 tablet (  40 mg total) by mouth daily. 90 tablet 0   traZODone (DESYREL) 50 MG tablet Take 0.5-1 tablets (25-50 mg total) by mouth at bedtime as needed for sleep. 30 tablet 2   No current facility-administered medications for this visit.    VITAL SIGNS: There were no vitals taken for this visit. There were no vitals filed for this visit.    Estimated body mass index is 24.98 kg/m as calculated from the following:   Height as of 06/17/20: 5\' 5"  (1.651 m).   Weight as of 06/17/20: 150 lb 2.1 oz (68.1 kg).  LABS: CBC:    Component Value Date/Time   WBC 8.9 06/18/2020 0523   HGB 10.0 (L) 06/18/2020 0523   HCT 30.9 (L) 06/18/2020 0523   PLT 414 (H) 06/18/2020 0523   MCV 91.7 06/18/2020 0523   NEUTROABS 13.3 (H) 06/17/2020 1040   LYMPHSABS 1.2 06/17/2020 1040   MONOABS 1.2 (H) 06/17/2020 1040   EOSABS 0.2 06/17/2020 1040   BASOSABS 0.0 06/17/2020 1040   Comprehensive Metabolic Panel:    Component Value Date/Time   NA 140 06/18/2020 0523   K 4.4 06/18/2020 0523   CL 98 06/18/2020 0523   CO2 31 06/18/2020 0523   BUN 15 06/18/2020 0523   CREATININE 0.95 06/18/2020 0523   GLUCOSE 118 (H) 06/18/2020 0523   CALCIUM 8.8 (L) 06/18/2020 0523   AST 23 06/17/2020 1040   ALT 24 06/17/2020 1040   ALKPHOS 68 06/17/2020 1040   BILITOT 0.8 06/17/2020 1040   PROT 6.9 06/17/2020 1040   ALBUMIN 3.3 (L) 06/17/2020 1040    RADIOGRAPHIC STUDIES: DG Chest 1 View  Result Date: 06/17/2020 CLINICAL DATA:  History of metastatic lung cancer. Altered mental status. EXAM: CHEST  1 VIEW COMPARISON:  June 11, 2020. FINDINGS: The heart size and mediastinal contours are within normal limits. Left lung is clear. Right upper lobe irregular density is again noted consistent with malignancy. No pneumothorax or pleural effusion is noted. The visualized skeletal structures are unremarkable. IMPRESSION: Stable right upper lobe density consistent with malignancy. No other abnormality seen in the chest. Electronically Signed   By: Marijo Conception M.D.   On: 06/17/2020 13:18   CT HEAD WO CONTRAST  Result Date: 06/17/2020 CLINICAL DATA:  Altered mental status. History of metastatic lung cancer. EXAM: CT HEAD WITHOUT CONTRAST TECHNIQUE: Contiguous axial images were obtained from the base of the skull through the vertex without intravenous contrast. COMPARISON:   May 29, 2020. FINDINGS: Brain: Mild chronic ischemic white matter disease is noted. No mass effect or midline shift is noted. Ventricular size is within normal limits. There is no evidence of mass lesion, hemorrhage or acute infarction. Possible metastatic lesions noted on recent MRI are not visualized on this study. Vascular: No hyperdense vessel or unexpected calcification. Skull: No definite fracture is noted. Multiple small rounded lucencies are noted throughout the skull which may represent benign venous lakes, but metastatic disease cannot be excluded given the history of metastatic lung cancer. Sinuses/Orbits: No acute finding. Other: None. IMPRESSION: 1. Mild chronic ischemic white matter disease. No acute intracranial abnormality seen. 2. Multiple small rounded lucencies are noted throughout the skull which may represent benign venous lakes, but metastatic disease cannot be excluded given the history of metastatic lung cancer. Electronically Signed   By: Marijo Conception M.D.   On: 06/17/2020 11:59   MR BRAIN W WO CONTRAST  Result Date: 06/17/2020 CLINICAL DATA:  Confusion with right arm and leg weakness. History  of metastatic lung cancer. EXAM: MRI HEAD WITHOUT AND WITH CONTRAST TECHNIQUE: Multiplanar, multiecho pulse sequences of the brain and surrounding structures were obtained without and with intravenous contrast. CONTRAST:  73mL GADAVIST GADOBUTROL 1 MMOL/ML IV SOLN COMPARISON:  Head CT 06/17/2020 and MRI 05/29/2020 FINDINGS: Brain: There are patchy acute to subacute infarcts in the left MCA territory, overall moderate in volume with greatest involvement of the lateral frontal and parietal cortex. There is also border zone involvement in the lateral left occipital lobe. Associated small volume petechial blood products are noted in the left frontoparietal and left occipital infarcts, and there is also an unchanged chronic microhemorrhage in the posterior left temporal lobe. Punctate acute  infarcts are present in the left basal ganglia. Periventricular white matter T2 hyperintensities are unchanged from the prior MRI and are nonspecific but compatible with mild chronic small vessel ischemic disease. Chronic lacunar infarcts are noted in the thalami and right lentiform nucleus. An unchanged T2 hyperintense focus in the right midbrain may represent a dilated perivascular space or chronic lacunar infarct. There is mild cerebral atrophy. No midline shift or extra-axial fluid collection is evident. A plaque-like 3 mm enhancing focus along the inferior right cerebellar hemisphere is unchanged from the prior MRI (series 21, image 31). The punctate enhancing focus in the medial left cerebellar hemisphere on the prior MRI is not evident today. A 2 mm focus of cortical enhancement in left frontoparietal cortex is secondary to the recent infarct. Vascular: Major intracranial vascular flow voids are preserved. Skull and upper cervical spine: Small enhancing foci in the clivus are unchanged (series 21, image 38). A 2 cm T1 hypointense lesion in the right parietal skull including a punctate focus of internal enhancement is also unchanged (series 16, image 129 and series 21, image 134). Sinuses/Orbits: Unremarkable orbits. Mild left maxillary sinus mucosal thickening. Small right mastoid effusion. Other: None. IMPRESSION: 1. Patchy acute to subacute left cerebral hemispheric infarcts primarily in the MCA territory. 2. Unchanged 3 mm enhancing focus in the right cerebellum, possibly a metastasis although a subacute infarct is an alternative consideration. 3. Nonvisualization of the punctate enhancing focus in the left cerebellum on the prior MRI. No evidence of new intracranial metastases. 4. Unchanged small enhancing calvarial lesions. Electronically Signed   By: Logan Bores M.D.   On: 06/17/2020 20:06   US Carotid Bilateral  Result Date: 06/18/2020 CLINICAL DATA:  Acute cerebral infarcts. EXAM: BILATERAL  CAROTID DUPLEX ULTRASOUND TECHNIQUE: Pearline Cables scale imaging, color Doppler and duplex ultrasound were performed of bilateral carotid and vertebral arteries in the neck. COMPARISON:  None. FINDINGS: Criteria: Quantification of carotid stenosis is based on velocity parameters that correlate the residual internal carotid diameter with NASCET-based stenosis levels, using the diameter of the distal internal carotid lumen as the denominator for stenosis measurement. The following velocity measurements were obtained: RIGHT ICA:  133/51 cm/sec CCA:  73/53 cm/sec SYSTOLIC ICA/CCA RATIO:  1.4 ECA:  199 cm/sec LEFT ICA:  114/37 cm/sec CCA:  299/24 cm/sec SYSTOLIC ICA/CCA RATIO:  1.1 ECA:  168 cm/sec RIGHT CAROTID ARTERY: Mild to moderate calcified plaque at the level of the distal bulb and proximal right ICA. Estimated right ICA stenosis is 50-69%. RIGHT VERTEBRAL ARTERY: Antegrade flow with normal waveform and velocity. LEFT CAROTID ARTERY: Mild to moderate predominately calcified plaque at the level of the left carotid bulb and proximal left ICA. Estimated left ICA stenosis is less than 50% based on velocities. LEFT VERTEBRAL ARTERY: Antegrade flow with normal waveform and velocity. IMPRESSION:  Mild to moderate calcified plaque at the level of bilateral carotid bulbs and proximal internal carotid arteries. Estimated right ICA stenosis is 50-69% and estimated left ICA stenosis is less than 50%. Electronically Signed   By: Aletta Edouard M.D.   On: 06/18/2020 09:25   DG CHEST PORT 1 VIEW  Result Date: 06/11/2020 CLINICAL DATA:  COVID. EXAM: PORTABLE CHEST 1 VIEW COMPARISON:  06/08/2020.  PET-CT 05/22/2020. FINDINGS: Mediastinum and hilar structures are stable. Known spiculated nodule in the right upper best identified on prior PET-CT. No acute infiltrates. No pleural effusion or pneumothorax. Known bony lesions best identified by prior PET-CT. Carotid vascular calcification. IMPRESSION: 1. Known spiculated right upper lung  nodule and multiple bony lesions best identified by prior PET-CT. 2.  No acute pulmonary disease.  Chest is stable from prior exam. 3.  Carotid vascular disease. Electronically Signed   By: Marcello Moores  Register   On: 06/11/2020 06:11   DG Chest Port 1 View  Result Date: 06/08/2020 CLINICAL DATA:  Dyspnea. Recent diagnosis of lung cancer. Family positive for COVID-19. EXAM: PORTABLE CHEST 1 VIEW COMPARISON:  Radiograph and CT 05/16/2020.  PET CT 05/22/2020. FINDINGS: The lungs are hyperinflated. Stable heart size and mediastinal contours. No convincing acute airspace disease. The known spiculated nodule at the right lung apex is only faintly visualized. No pneumothorax or large pleural effusion. Known osseous metastasis on PET not well seen by radiograph. IMPRESSION: 1. No acute findings. 2. Chronic hyperinflation. The known spiculated nodule at the right lung apex is only faintly visualized radiographically. Electronically Signed   By: Keith Rake M.D.   On: 06/08/2020 18:43   ECHOCARDIOGRAM COMPLETE  Result Date: 06/19/2020    ECHOCARDIOGRAM REPORT   Patient Name:   PRESTON GARABEDIAN Date of Exam: 06/18/2020 Medical Rec #:  [id number]        Height:       65.0 in Accession #:    [id number]       Weight:       150.1 lb Date of Birth:  09/22/1956        BSA:          1.751 m Patient Age:    74 years         BP:           150/99 mmHg Patient Gender: M                HR:           61 bpm. Exam Location:  [facility] Procedure: 2D Echo, Color Doppler and Cardiac Doppler Indications:     I163.9 Stroke  History:         Patient has no prior history of Echocardiogram examinations.                  COPD; Risk Factors:Hypertension and Dyslipidemia. Pt tested                  positive for COVID-19 on 06/08/20.  Sonographer:     Charmayne Sheer RDCS (AE) Referring Phys:  [id number] Loletha Grayer Diagnosing Phys: Serafina Royals MD  Sonographer Comments: Technically difficult study due to poor echo windows. Image acquisition challenging  due to uncooperative patient and Image acquisition challenging due to COPD. IMPRESSIONS  1. Left ventricular ejection fraction, by estimation, is 65 to 70%. The left ventricle has normal function. The left ventricle has no regional wall motion abnormalities. Left ventricular diastolic parameters were normal.  2. Right ventricular systolic function  is normal. The right ventricular size is normal.  3. The mitral valve is normal in structure. Trivial mitral valve regurgitation.  4. The aortic valve is normal in structure. Aortic valve regurgitation is not visualized. FINDINGS  Left Ventricle: Left ventricular ejection fraction, by estimation, is 65 to 70%. The left ventricle has normal function. The left ventricle has no regional wall motion abnormalities. The left ventricular internal cavity size was small. There is no left ventricular hypertrophy. Left ventricular diastolic parameters were normal. Right Ventricle: The right ventricular size is normal. No increase in right ventricular wall thickness. Right ventricular systolic function is normal. Left Atrium: Left atrial size was normal in size. Right Atrium: Right atrial size was normal in size. Pericardium: There is no evidence of pericardial effusion. Mitral Valve: The mitral valve is normal in structure. Trivial mitral valve regurgitation. MV peak gradient, 2.0 mmHg. The mean mitral valve gradient is 1.0 mmHg. Tricuspid Valve: The tricuspid valve is normal in structure. Tricuspid valve regurgitation is trivial. Aortic Valve: The aortic valve is normal in structure. Aortic valve regurgitation is not visualized. Aortic valve mean gradient measures 1.0 mmHg. Aortic valve peak gradient measures 2.7 mmHg. Aortic valve area, by VTI measures 3.10 cm. Pulmonic Valve: The pulmonic valve was normal in structure. Pulmonic valve regurgitation is trivial. Aorta: The aortic root and ascending aorta are structurally normal, with no evidence of dilitation. IAS/Shunts: No atrial  level shunt detected by color flow Doppler.  LEFT VENTRICLE PLAX 2D LVOT diam:     2.20 cm  Diastology LV SV:         49       LV e' lateral:   7.51 cm/s LV SV Index:   28       LV E/e' lateral: 6.3 LVOT Area:     3.80 cm LV e' medial:    6.96 cm/s                         LV E/e' medial:  6.8  AORTIC VALVE                   PULMONIC VALVE AV Area (Vmax):    3.10 cm    PV Vmax:       1.08 m/s AV Area (Vmean):   3.65 cm    PV Vmean:      75.400 cm/s AV Area (VTI):     3.10 cm    PV VTI:        0.190 m AV Vmax:           82.50 cm/s  PV Peak grad:  4.7 mmHg AV Vmean:          51.700 cm/s PV Mean grad:  3.0 mmHg AV VTI:            0.158 m AV Peak Grad:      2.7 mmHg AV Mean Grad:      1.0 mmHg LVOT Vmax:         67.20 cm/s LVOT Vmean:        49.600 cm/s LVOT VTI:          0.129 m LVOT/AV VTI ratio: 0.82  AORTA Ao Root diam: 3.50 cm MITRAL VALVE MV Area (PHT): 2.32 cm    SHUNTS MV Peak grad:  2.0 mmHg    Systemic VTI:  0.13 m MV Mean grad:  1.0 mmHg    Systemic Diam: 2.20 cm MV Vmax:  0.71 m/s MV Vmean:      39.4 cm/s MV Decel Time: 327 msec MV E velocity: 47.10 cm/s MV A velocity: 55.30 cm/s MV E/A ratio:  0.85 Serafina Royals MD Electronically signed by Serafina Royals MD Signature Date/Time: 06/19/2020/12:14:55 PM    Final     PERFORMANCE STATUS (ECOG) : 1 - Symptomatic but completely ambulatory  Review of Systems Unless otherwise noted, a complete review of systems is negative.  Physical Exam General: NAD Cardiovascular: regular rate and rhythm Pulmonary: unlabored Abdomen: soft, nontender, + bowel sounds GU: no suprapubic tenderness Extremities: no edema, no joint deformities Skin: no rashes Neurological: Weakness but otherwise nonfocal  IMPRESSION: Routine follow-up visit today.  Patient was accompanied by his stepson.  His stepdaughter dissipated the visit via phone.  Patient reports that he feels he is doing better since discharging home from the hospital.  He is working with home  health.  His performance status remains poor.  Family have to assist him with standing and ambulating.  Family requested a walker to help with ambulation.  Spoke with Thedore Mins with [employer] and will order DME.  Patient continues to require oxygen.  He has been on 5 to 6 L O2 at home since his hospitalization last month with Covid.  However, today in the clinic he had his oxygen off and his SaO2 was in the high 80s low 90s.  I replaced oxygen at 2 L and his SaO2 increased into the mid to high 90s.  Discussed oxygen titration parameters with family.  Patient is initiating XRT.  Family would like to follow-up with Dr. Mike Gip to discuss future treatment options versus transitioning to hospice in the event of decline.  Symptomatically, patient reports stable pain.  He is sleeping better on trazodone.  PLAN: -Continue current scope of treatment -Continue OxyContin 10 mg every 8 hours -Continue oxycodone 5 to 10 mg every 4 hours as needed for breakthrough pain -Continue trazodone -DME: Walker -RTC in 2 weeks   Patient expressed understanding and was in agreement with this plan. He also understands that He can call the clinic at any time with any questions, concerns, or complaints.     Time Total: 20 minutes  Visit consisted of counseling and education dealing with the complex and emotionally intense issues of symptom management and palliative care in the setting of serious and potentially life-threatening illness.Greater than 50%  of this time was spent counseling and coordinating care related to the above assessment and plan.  Signed by: Altha Harm, PhD, NP-C

## 2020-07-01 NOTE — Progress Notes (Signed)
Radiation Oncology Follow up Note  Name: Douglas [name]   Date:   07/01/2020 MRN:  [id number] DOB: [birth year]    This 64 y.o. 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  Patient is most likely has stage IV lung cancer with brain and bone involvement.  REFERRING PROVIDER: No ref. provider found  HPI: Patient is a 64 year old male with stage IV adenocarcinoma who originally consulted back in August and recommended treatment to his cervical lumbar and SI joint region for significant pain secondary to metastatic disease.  Patient developed Covid he is now 3 weeks out is doing fairly well and is seen today for reconsideration of going ahead with simulation to his spine.  He otherwise is having no headaches or any focal neurologic deficits.  He does have known brain metastasis.  He had very little edema for small brain mets and we decided to hold off on treatment to that area at this time..  COMPLICATIONS OF TREATMENT: none  FOLLOW UP COMPLIANCE: keeps appointments   PHYSICAL EXAM:  BP 108/64   Pulse 79   Temp (!) 96.9 F (36.1 C) (Tympanic)   SpO2 100% Comment: on 6liters of o2 Wheelchair-bound male in NAD.  Well-developed well-nourished patient in NAD. HEENT reveals PERLA, EOMI, discs not visualized.  Oral cavity is clear. No oral mucosal lesions are identified. Neck is clear without evidence of cervical or supraclavicular adenopathy. Lungs are clear to A&P. Cardiac examination is essentially unremarkable with regular rate and rhythm without murmur rub or thrill. Abdomen is benign with no organomegaly or masses noted. Motor sensory and DTR levels are equal and symmetric in the upper and lower extremities. Cranial nerves II through XII are grossly intact. Proprioception is intact. No peripheral adenopathy or edema is identified. No  motor or sensory levels are noted. Crude visual fields are within normal range.  RADIOLOGY RESULTS: PET CT scan reviewed, brain MRI reviewed  PLAN: Present time elect go ahead with palliative course of radiation therapy to his cervical spine as well as his lumbar spine and SI joints.  We will plan on delivering 3000 cGy in 10 fractions to both areas.  Risks and benefits of treatment including possible dysphagia skin reaction fatigue alteration of blood counts and chance of diarrhea and increased lower urinary tract symptoms all were described in detail to the patient.  I spoke personally with the patient's sister.  We will go ahead with palliative radiation to alleviate his pain.  Patient comprehends my recommendations well.  I would like to take this opportunity to thank you for allowing me to participate in the care of your patient.Noreene Filbert, MD

## 2020-07-02 ENCOUNTER — Encounter: Payer: Self-pay | Admitting: Hematology and Oncology

## 2020-07-02 ENCOUNTER — Ambulatory Visit: Payer: Medicare Other

## 2020-07-02 ENCOUNTER — Inpatient Hospital Stay (HOSPITAL_BASED_OUTPATIENT_CLINIC_OR_DEPARTMENT_OTHER): Payer: Medicare Other | Admitting: Hematology and Oncology

## 2020-07-02 ENCOUNTER — Inpatient Hospital Stay: Payer: Medicare Other

## 2020-07-02 ENCOUNTER — Telehealth: Payer: Self-pay | Admitting: *Deleted

## 2020-07-02 VITALS — BP 91/58 | HR 74 | Resp 18 | Wt 132.0 lb

## 2020-07-02 DIAGNOSIS — I63312 Cerebral infarction due to thrombosis of left middle cerebral artery: Secondary | ICD-10-CM | POA: Diagnosis not present

## 2020-07-02 DIAGNOSIS — C3490 Malignant neoplasm of unspecified part of unspecified bronchus or lung: Secondary | ICD-10-CM

## 2020-07-02 DIAGNOSIS — R634 Abnormal weight loss: Secondary | ICD-10-CM

## 2020-07-02 DIAGNOSIS — C3411 Malignant neoplasm of upper lobe, right bronchus or lung: Secondary | ICD-10-CM | POA: Diagnosis not present

## 2020-07-02 DIAGNOSIS — C7951 Secondary malignant neoplasm of bone: Secondary | ICD-10-CM

## 2020-07-02 DIAGNOSIS — C7931 Secondary malignant neoplasm of brain: Secondary | ICD-10-CM

## 2020-07-02 DIAGNOSIS — C7970 Secondary malignant neoplasm of unspecified adrenal gland: Secondary | ICD-10-CM

## 2020-07-02 DIAGNOSIS — M899 Disorder of bone, unspecified: Secondary | ICD-10-CM

## 2020-07-02 DIAGNOSIS — Z7189 Other specified counseling: Secondary | ICD-10-CM

## 2020-07-02 MED ORDER — CYANOCOBALAMIN 1000 MCG/ML IJ SOLN
INTRAMUSCULAR | Status: AC
  Filled 2020-07-02: qty 1

## 2020-07-02 MED ORDER — CYANOCOBALAMIN 1000 MCG/ML IJ SOLN
1000.0000 ug | Freq: Once | INTRAMUSCULAR | Status: AC
  Administered 2020-07-02: 1000 ug via INTRAMUSCULAR

## 2020-07-02 NOTE — Telephone Encounter (Signed)
Daughter instructed per VERBAL ORDER Dr Mike Gip to take patient to Urgent Care or ER. She stating"Oh, OK"

## 2020-07-02 NOTE — Telephone Encounter (Signed)
Daughter called reporting that on the way home form his appointment seeing Dr C this morning that he became very disoriented and very weak. She is asking for a return call.

## 2020-07-02 NOTE — Patient Instructions (Addendum)
Begin folic acid 1 mg a day.   Pemetrexed injection What is this medicine? PEMETREXED (PEM e TREX ed) is a chemotherapy drug used to treat lung cancers like non-small cell lung cancer and mesothelioma. It may also be used to treat other cancers. This medicine may be used for other purposes; ask your health care provider or pharmacist if you have questions. COMMON BRAND NAME(S): Alimta What should I tell my health care provider before I take this medicine? They need to know if you have any of these conditions:  infection (especially a virus infection such as chickenpox, cold sores, or herpes)  kidney disease  low blood counts, like low white cell, platelet, or red cell counts  lung or breathing disease, like asthma  radiation therapy  an unusual or allergic reaction to pemetrexed, other medicines, foods, dyes, or preservative  pregnant or trying to get pregnant  breast-feeding How should I use this medicine? This drug is given as an infusion into a vein. It is administered in a hospital or clinic by a specially trained health care professional. Talk to your pediatrician regarding the use of this medicine in children. Special care may be needed. Overdosage: If you think you have taken too much of this medicine contact a poison control center or emergency room at once. NOTE: This medicine is only for you. Do not share this medicine with others. What if I miss a dose? It is important not to miss your dose. Call your doctor or health care professional if you are unable to keep an appointment. What may interact with this medicine? This medicine may interact with the following medications:  Ibuprofen This list may not describe all possible interactions. Give your health care provider a list of all the medicines, herbs, non-prescription drugs, or dietary supplements you use. Also tell them if you smoke, drink alcohol, or use illegal drugs. Some items may interact with your  medicine. What should I watch for while using this medicine? Visit your doctor for checks on your progress. This drug may make you feel generally unwell. This is not uncommon, as chemotherapy can affect healthy cells as well as cancer cells. Report any side effects. Continue your course of treatment even though you feel ill unless your doctor tells you to stop. In some cases, you may be given additional medicines to help with side effects. Follow all directions for their use. Call your doctor or health care professional for advice if you get a fever, chills or sore throat, or other symptoms of a cold or flu. Do not treat yourself. This drug decreases your body's ability to fight infections. Try to avoid being around people who are sick. This medicine may increase your risk to bruise or bleed. Call your doctor or health care professional if you notice any unusual bleeding. Be careful brushing and flossing your teeth or using a toothpick because you may get an infection or bleed more easily. If you have any dental work done, tell your dentist you are receiving this medicine. Avoid taking products that contain aspirin, acetaminophen, ibuprofen, naproxen, or ketoprofen unless instructed by your doctor. These medicines may hide a fever. Call your doctor or health care professional if you get diarrhea or mouth sores. Do not treat yourself. To protect your kidneys, drink water or other fluids as directed while you are taking this medicine. Do not become pregnant while taking this medicine or for 6 months after stopping it. Women should inform their doctor if they wish to become  pregnant or think they might be pregnant. Men should not father a child while taking this medicine and for 3 months after stopping it. This may interfere with the ability to father a child. You should talk to your doctor or health care professional if you are concerned about your fertility. There is a potential for serious side effects to  an unborn child. Talk to your health care professional or pharmacist for more information. Do not breast-feed an infant while taking this medicine or for 1 week after stopping it. What side effects may I notice from receiving this medicine? Side effects that you should report to your doctor or health care professional as soon as possible:  allergic reactions like skin rash, itching or hives, swelling of the face, lips, or tongue  breathing problems  redness, blistering, peeling or loosening of the skin, including inside the mouth  signs and symptoms of bleeding such as bloody or black, tarry stools; red or dark-brown urine; spitting up blood or brown material that looks like coffee grounds; red spots on the skin; unusual bruising or bleeding from the eye, gums, or nose  signs and symptoms of infection like fever or chills; cough; sore throat; pain or trouble passing urine  signs and symptoms of kidney injury like trouble passing urine or change in the amount of urine  signs and symptoms of liver injury like dark yellow or brown urine; general ill feeling or flu-like symptoms; light-colored stools; loss of appetite; nausea; right upper belly pain; unusually weak or tired; yellowing of the eyes or skin Side effects that usually do not require medical attention (report to your doctor or health care professional if they continue or are bothersome):  constipation  mouth sores  nausea, vomiting  unusually weak or tired This list may not describe all possible side effects. Call your doctor for medical advice about side effects. You may report side effects to FDA at 1-800-FDA-1088. Where should I keep my medicine? This drug is given in a hospital or clinic and will not be stored at home. NOTE: This sheet is a summary. It may not cover all possible information. If you have questions about this medicine, talk to your doctor, pharmacist, or health care provider.  2020 Elsevier/Gold Standard  (2017-11-17 16:11:33)   Denosumab injection What is this medicine? DENOSUMAB (den oh sue mab) slows bone breakdown. Prolia is used to treat osteoporosis in women after menopause and in men, and in people who are taking corticosteroids for 6 months or more. Delton See is used to treat a high calcium level due to cancer and to prevent bone fractures and other bone problems caused by multiple myeloma or cancer bone metastases. Delton See is also used to treat giant cell tumor of the bone. This medicine may be used for other purposes; ask your health care provider or pharmacist if you have questions. COMMON BRAND NAME(S): Prolia, XGEVA What should I tell my health care provider before I take this medicine? They need to know if you have any of these conditions:  dental disease  having surgery or tooth extraction  infection  kidney disease  low levels of calcium or Vitamin D in the blood  malnutrition  on hemodialysis  skin conditions or sensitivity  thyroid or parathyroid disease  an unusual reaction to denosumab, other medicines, foods, dyes, or preservatives  pregnant or trying to get pregnant  breast-feeding How should I use this medicine? This medicine is for injection under the skin. It is given by a health  care professional in a hospital or clinic setting. A special MedGuide will be given to you before each treatment. Be sure to read this information carefully each time. For Prolia, talk to your pediatrician regarding the use of this medicine in children. Special care may be needed. For Delton See, talk to your pediatrician regarding the use of this medicine in children. While this drug may be prescribed for children as young as 13 years for selected conditions, precautions do apply. Overdosage: If you think you have taken too much of this medicine contact a poison control center or emergency room at once. NOTE: This medicine is only for you. Do not share this medicine with others. What if  I miss a dose? It is important not to miss your dose. Call your doctor or health care professional if you are unable to keep an appointment. What may interact with this medicine? Do not take this medicine with any of the following medications:  other medicines containing denosumab This medicine may also interact with the following medications:  medicines that lower your chance of fighting infection  steroid medicines like prednisone or cortisone This list may not describe all possible interactions. Give your health care provider a list of all the medicines, herbs, non-prescription drugs, or dietary supplements you use. Also tell them if you smoke, drink alcohol, or use illegal drugs. Some items may interact with your medicine. What should I watch for while using this medicine? Visit your doctor or health care professional for regular checks on your progress. Your doctor or health care professional may order blood tests and other tests to see how you are doing. Call your doctor or health care professional for advice if you get a fever, chills or sore throat, or other symptoms of a cold or flu. Do not treat yourself. This drug may decrease your body's ability to fight infection. Try to avoid being around people who are sick. You should make sure you get enough calcium and vitamin D while you are taking this medicine, unless your doctor tells you not to. Discuss the foods you eat and the vitamins you take with your health care professional. See your dentist regularly. Brush and floss your teeth as directed. Before you have any dental work done, tell your dentist you are receiving this medicine. Do not become pregnant while taking this medicine or for 5 months after stopping it. Talk with your doctor or health care professional about your birth control options while taking this medicine. Women should inform their doctor if they wish to become pregnant or think they might be pregnant. There is a  potential for serious side effects to an unborn child. Talk to your health care professional or pharmacist for more information. What side effects may I notice from receiving this medicine? Side effects that you should report to your doctor or health care professional as soon as possible:  allergic reactions like skin rash, itching or hives, swelling of the face, lips, or tongue  bone pain  breathing problems  dizziness  jaw pain, especially after dental work  redness, blistering, peeling of the skin  signs and symptoms of infection like fever or chills; cough; sore throat; pain or trouble passing urine  signs of low calcium like fast heartbeat, muscle cramps or muscle pain; pain, tingling, numbness in the hands or feet; seizures  unusual bleeding or bruising  unusually weak or tired Side effects that usually do not require medical attention (report to your doctor or health care professional if  they continue or are bothersome):  constipation  diarrhea  headache  joint pain  loss of appetite  muscle pain  runny nose  tiredness  upset stomach This list may not describe all possible side effects. Call your doctor for medical advice about side effects. You may report side effects to FDA at 1-800-FDA-1088. Where should I keep my medicine? This medicine is only given in a clinic, doctor's office, or other health care setting and will not be stored at home. NOTE: This sheet is a summary. It may not cover all possible information. If you have questions about this medicine, talk to your doctor, pharmacist, or health care provider.  2020 Elsevier/Gold Standard (2018-02-04 16:10:44)

## 2020-07-02 NOTE — Progress Notes (Signed)
Patients daughter reports new feet swelling in patient

## 2020-07-03 ENCOUNTER — Other Ambulatory Visit: Payer: Medicare Other | Admitting: Primary Care

## 2020-07-03 ENCOUNTER — Telehealth: Payer: Self-pay | Admitting: *Deleted

## 2020-07-03 ENCOUNTER — Ambulatory Visit: Payer: Medicare Other

## 2020-07-03 ENCOUNTER — Other Ambulatory Visit: Payer: Self-pay

## 2020-07-03 DIAGNOSIS — C7970 Secondary malignant neoplasm of unspecified adrenal gland: Secondary | ICD-10-CM

## 2020-07-03 DIAGNOSIS — Z515 Encounter for palliative care: Secondary | ICD-10-CM

## 2020-07-03 DIAGNOSIS — C7951 Secondary malignant neoplasm of bone: Secondary | ICD-10-CM

## 2020-07-03 DIAGNOSIS — J449 Chronic obstructive pulmonary disease, unspecified: Secondary | ICD-10-CM

## 2020-07-03 NOTE — Telephone Encounter (Signed)
Douglas [name] with [facility] called asking for continuation of nsg orders and for SW order to assist with community resources. Please advise

## 2020-07-03 NOTE — Telephone Encounter (Signed)
  Please help assist with community resources.  M

## 2020-07-03 NOTE — Progress Notes (Addendum)
[facility] Consult Note Telephone: [phone]  Fax: [phone]  PATIENT NAME: Douglas [name] [address] [place] [address] [phone] (home)  DOB: 10-Sep-1956 MRN: [id number]  PRIMARY CARE PROVIDER:    Patient, No Pcp Per,  No address on file None  REFERRING PROVIDER:   [name], MD [address] [place],  [place] [address] [phone]  RESPONSIBLE PARTY:   Extended Emergency Contact Information Primary Emergency Contact: Rudd,Sherrie Address: [address]          [place], [place] [address] [place]te [place] Phone: [phone] Mobile Phone: [phone] Relation: Daughter Secondary Emergency Contact: [facility] Phone: [phone] Mobile Phone: [phone] Relation: Son Preferred language: English Interpreter needed? No  I met face to face with patient and family in home .I arrived today to find him somnolent. A family member had called EMS who arrived three minutes behind me. We discussed that his and family goal of care is comfort and family like to  keep him home and asked for immediate hospice admission. Dr. Mike Gip agreed to attend. Referral made to hospice.   ASSESSMENT AND RECOMMENDATIONS:   1. Advance Care Planning/Goals of Care: Goals include to maximize quality of life and symptom management. Our advance care planning conversation included a discussion about:     The value and importance of advance care planning   Experiences with loved ones who have been seriously ill or have died   Exploration of personal, cultural or spiritual beliefs that might influence medical decisions   Exploration of goals of care in the event of a sudden injury or illness   Identification  of a healthcare agent -Sherry  Review and updating  of an  advance directive document MOST prepared with comfort measures, DNR in home.  We discussed the lack of benefit of the radiation and chemo at  this juncture as he appears to be in active EOL  transition. We updated  the most form with full comfort measures and DNR was already in place. Family was instructed that this would be informative if EMS were called again. Daughter has taped these directives above the bed.   2. Symptom Management:   Agitation: He has terminal agitation. I have ordered haldol and Ativan, morphine and nebulizers have in the  home ahead of the hospice admission for comfort management. Education provided RE use and doses.  Dyspnea: Pt agitated and pulling off oxygen. Family instructed to support pt with this, it will not change outcome and is for his comfort only. Instructed to use morphine for dyspnea as needed. Some cyanosis and unable to register oxygen on pulse oximeter. I ordered duonebs q 4 hrs for SOB, family had a nebulizer machine and needed the vials.   Disease Process: Education provided regarding disease process. Family was incredulous that patient declined this quickly. We talked about the nature of brain metastatic disease as well as CVA since he had one several weeks ago. This could possibly recur. He is also in an active terminal phase and his mentation is labile. I educated them regarding symptom management for the medications I've ordered. I instructed to treat for pain.  While I was in the home daughter tried to administer the oxycodone tablet but he is not able to cooperate to drink and swallow. I instructed her in use of the liquid morphine and crushing other medication For administration.   Psychosocial: Family needed significant emotional support. Many came during my visit to see  the patient and there were many grandchildren present. Family would like to be connected with [facility] as soon as possible; referral intake notified. Family stated goals are symptom management and support in place for patient and family unit until through death process. DME is currently in the home with [facility]  and family has their own nebulizer machine after all. Referral office notified and will contact [facility].  3. Follow up Palliative Care Visit: Referred for immediate hospice admission. Family instructed to call [facility] NP if needed prior.    4. Family /Caregiver/Community Supports: Daughter and Son in home, POA. Many extended family in the home. Able to attend him ATC due to agitation.  5. Cognitive / Functional decline: A and O x 1, verbal but unintelligible or nonsensical speech. Fall risk, family aware. Agitation.  I spent 120 minutes providing this consultation,  from 1045 to 1245. More than 50% of the time in this consultation was spent coordinating communication.   CHIEF COMPLAINT: agitation, EOL  HISTORY OF PRESENT ILLNESS:  Douglas [name] is a 64 y.o. year old male with multiple medical problems including Metastatic lung cancer, wt loss, agitation, pain. Palliative Care was asked to follow this patient by consultation request of Corcoran, [name] C, MD to help address advance care planning and goals of care. This is an initial  visit.  CODE STATUS: DNR, Comfort measures  PPS: 30%  HOSPICE ELIGIBILITY/DIAGNOSIS: yes/lung cancer  PAST MEDICAL HISTORY:  Past Medical History:  Diagnosis Date  . Cancer (HCC)    lung with brain mets  . COPD (chronic obstructive pulmonary disease) (HCC)   . Hyperlipidemia   . Hypertension   . Lesion of adrenal gland (HCC)   . Lung mass     SOCIAL HX:  Social History   Tobacco Use  . Smoking status: Current Every Day Smoker    Packs/day: 0.50    Years: 45.00    Pack years: 22.50    Types: Cigarettes    Start date: 05/29/1975  . Smokeless tobacco: Never Used  . Tobacco comment: 1 pack/week  Substance Use Topics  . Alcohol use: Yes    Comment: 2 beers per day   FAMILY HX:  Family History  Problem Relation Age of Onset  . Cancer Neg Hx     ALLERGIES: No Known Allergies   PERTINENT MEDICATIONS:  Outpatient Encounter Medications as of  07/03/2020  Medication Sig  . albuterol (VENTOLIN HFA) 108 (90 Base) MCG/ACT inhaler Inhale 1-2 puffs into the lungs every 6 (six) hours as needed for wheezing or shortness of breath.  . aspirin EC 325 MG EC tablet Take 1 tablet (325 mg total) by mouth daily.  . atorvastatin (LIPITOR) 40 MG tablet Take 1 tablet (40 mg total) by mouth every evening.  . Budeson-Glycopyrrol-Formoterol (BREZTRI AEROSPHERE) 160-9-4.8 MCG/ACT AERO Inhale 2 puffs into the lungs in the morning and at bedtime.  . gabapentin (NEURONTIN) 300 MG capsule Take 1 capsule (300 mg total) by mouth 2 (two) times daily.  . losartan-hydrochlorothiazide (HYZAAR) 50-12.5 MG tablet Take 1 tablet by mouth daily.  . oxyCODONE (OXY IR/ROXICODONE) 5 MG immediate release tablet Take 1-2 tablets (5-10 mg total) by mouth every 4 (four) hours as needed for severe pain.  . oxyCODONE (OXYCONTIN) 10 mg 12 hr tablet Take 1 tablet (10 mg total) by mouth every 8 (eight) hours.  . pantoprazole (PROTONIX) 40 MG tablet Take 1 tablet (40 mg total) by mouth daily.  . traZODone (DESYREL) 50 MG   tablet Take 0.5-1 tablets (25-50 mg total) by mouth at bedtime as needed for sleep.   No facility-administered encounter medications on file as of 07/03/2020.    PHYSICAL EXAM / ROS:  BP 84/62, HR 66, Resp 24, oxygen not registerin Current and past weights: 132, 20 lb loss in 1 month. General: NAD, frail appearing, thin Cardiovascular: S1S2, no chest pain reported, no edema  Pulmonary: Lungs moving air poorly, scattered wheezes. +cough, + increased SOB, oxygen at 5 L/[place] Abdomen: appetite poor, intake 10%, continent of bowel GU: continent of urine MSK:  + joint and ROM abnormalities, non ambulatory, can transfer with 2 assist Skin: no rashes or wounds reported, facial cyanosis Neurological: Weakness, pain ad 8/10, verbal but confused and agitated   McKelvey , NP , DNP, MPH, ACHPN  COVID-19 PATIENT SCREENING TOOL  Person answering questions:  ____________sherry______ _____   1.  Is the patient or any family member in the home showing any signs or symptoms regarding respiratory infection?               Person with Symptom- __________NA_________________  a. Fever                                                                          Yes___ No___          ___________________  b. Shortness of breath                                                    Yes___ No___          ___________________ c. Cough/congestion                                       Yes___  No___         ___________________ d. Body aches/pains                                                         Yes___ No___        ____________________ e. Gastrointestinal symptoms (diarrhea, nausea)           Yes___ No___        ____________________  2. Within the past 14 days, has anyone living in the home had any contact with someone with or under investigation for COVID-19?    Yes___ No_X_   Person __________________   

## 2020-07-04 ENCOUNTER — Ambulatory Visit: Payer: Medicare Other

## 2020-07-05 ENCOUNTER — Ambulatory Visit: Payer: Medicare Other

## 2020-07-08 ENCOUNTER — Ambulatory Visit: Payer: Medicare Other

## 2020-07-09 ENCOUNTER — Other Ambulatory Visit: Payer: Self-pay | Admitting: Primary Care

## 2020-07-09 ENCOUNTER — Ambulatory Visit: Payer: Medicare Other

## 2020-07-09 ENCOUNTER — Inpatient Hospital Stay: Payer: Medicare Other

## 2020-07-10 ENCOUNTER — Ambulatory Visit: Payer: Medicare Other | Admitting: Hematology and Oncology

## 2020-07-10 ENCOUNTER — Other Ambulatory Visit: Payer: Medicare Other

## 2020-07-10 ENCOUNTER — Ambulatory Visit: Payer: Medicare Other

## 2020-07-11 ENCOUNTER — Ambulatory Visit: Payer: Medicare Other | Admitting: Gastroenterology

## 2020-07-11 ENCOUNTER — Ambulatory Visit: Payer: Medicare Other | Admitting: Hematology and Oncology

## 2020-07-11 ENCOUNTER — Other Ambulatory Visit: Payer: Medicare Other

## 2020-07-11 ENCOUNTER — Ambulatory Visit: Payer: Medicare Other

## 2020-07-12 ENCOUNTER — Ambulatory Visit: Payer: Medicare Other

## 2020-07-12 DEATH — deceased

## 2020-07-15 ENCOUNTER — Ambulatory Visit: Payer: Medicare Other

## 2020-07-15 ENCOUNTER — Encounter: Payer: Medicare Other | Admitting: Hospice and Palliative Medicine

## 2020-07-16 ENCOUNTER — Ambulatory Visit: Payer: Medicare Other

## 2020-07-17 ENCOUNTER — Ambulatory Visit: Payer: Medicare Other

## 2020-07-18 ENCOUNTER — Ambulatory Visit: Payer: Medicare Other

## 2020-07-19 ENCOUNTER — Ambulatory Visit: Payer: Medicare Other

## 2020-08-12 DEATH — deceased

## 2022-04-03 IMAGING — RF DG ESOPHAGUS
5 series · 13 of 13 positions shown · non-contrast
Comparison: None.

CLINICAL DATA: Odynophagia.  Difficulty swallowing.

EXAM:
ESOPHOGRAM / BARIUM SWALLOW / BARIUM TABLET STUDY
TECHNIQUE: Combined double contrast and single contrast examination performed
using effervescent crystals, thick barium liquid, and thin barium
liquid. The patient was observed with fluoroscopy swallowing a 13 mm
barium sulphate tablet.
FLUOROSCOPY TIME:  Fluoroscopy Time:  1 minutes 6 seconds
Radiation Exposure Index (if provided by the fluoroscopic device):
3.7 mGy
Number of Acquired Spot Images: 0

[Series 1: cp_standard · 0.25mm/px · 4 of 22 frames shown (1 of 5)]
[frame 2/22]
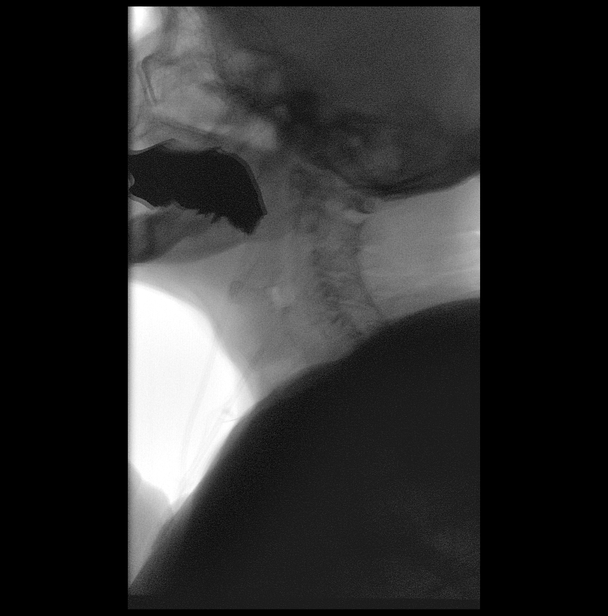
[frame 4/22]
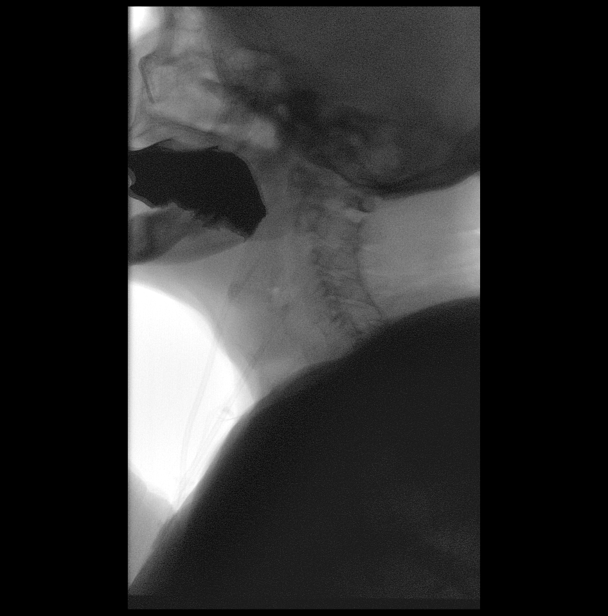
[frame 12/22]
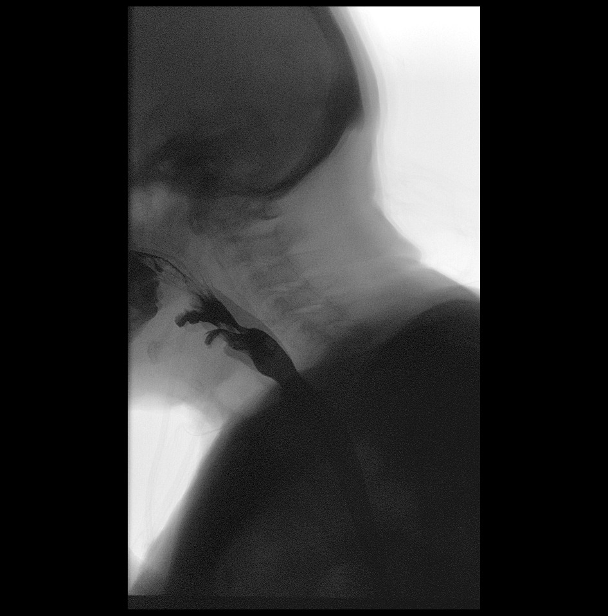
[frame 19/22]
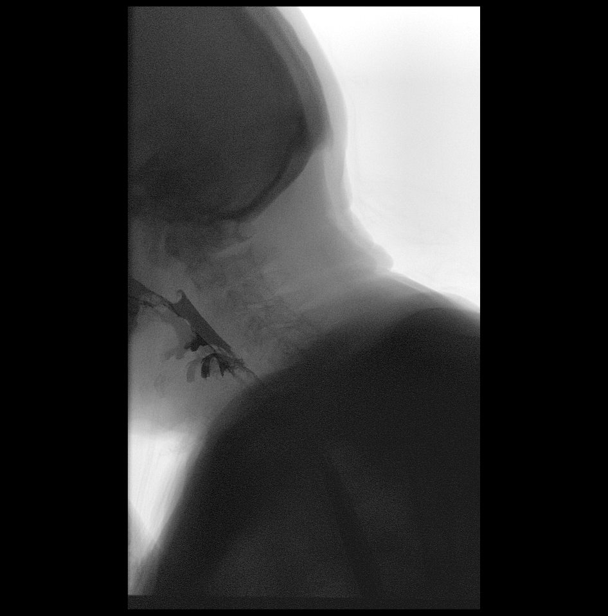

[Series 2: cp_standard · 0.25mm/px · 3 of 35 frames shown (2 of 5)]
[frame 6/35]
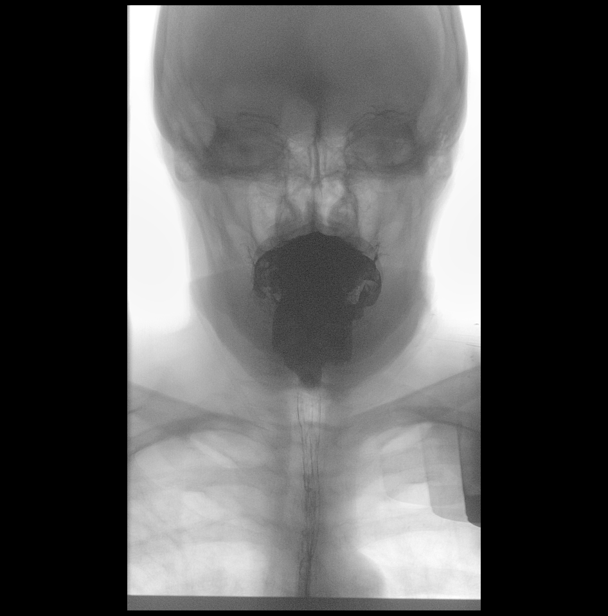
[frame 18/35]
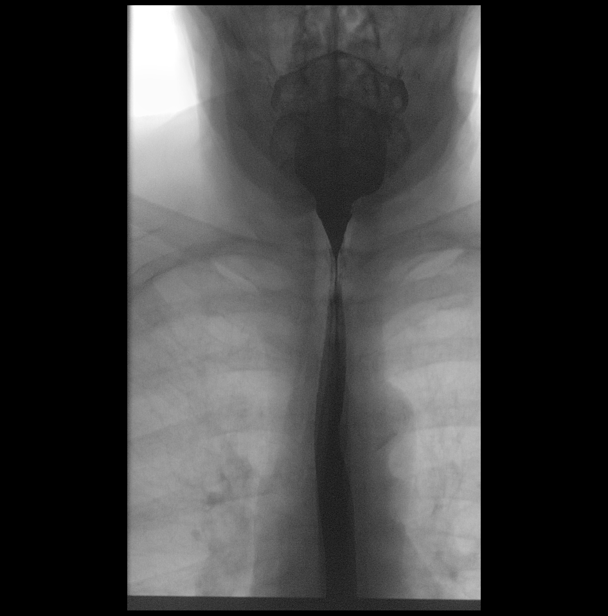
[frame 30/35]
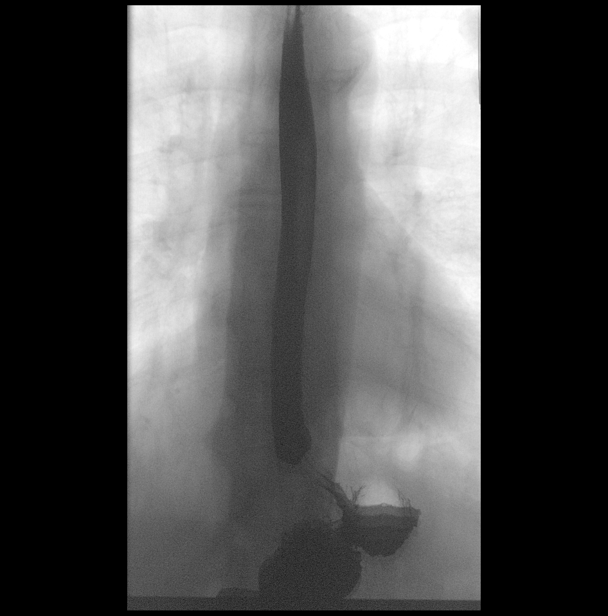

[Series 3: cp_standard · 0.25mm/px · 4 of 80 frames shown (3 of 5)]
[frame 13/80]
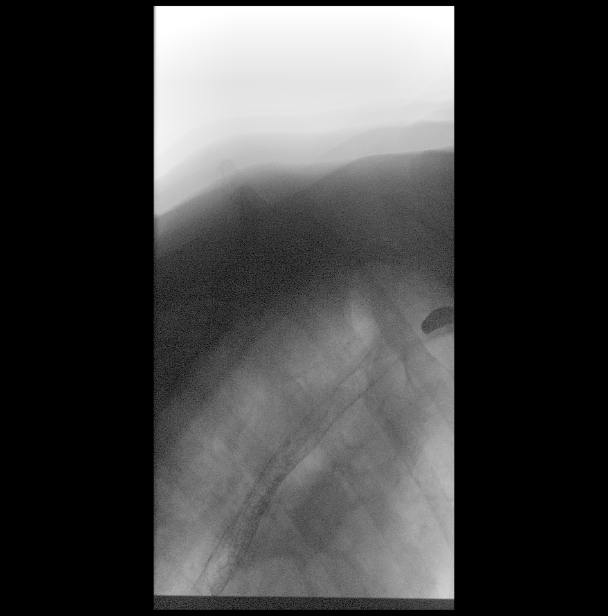
[frame 41/80]
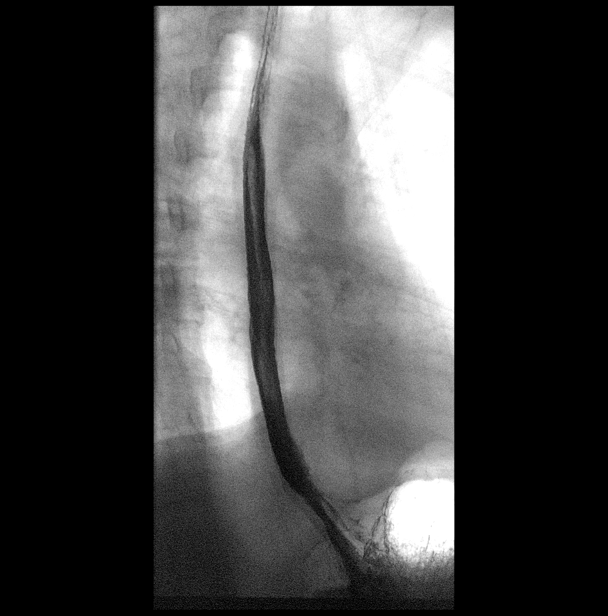
[frame 55/80]
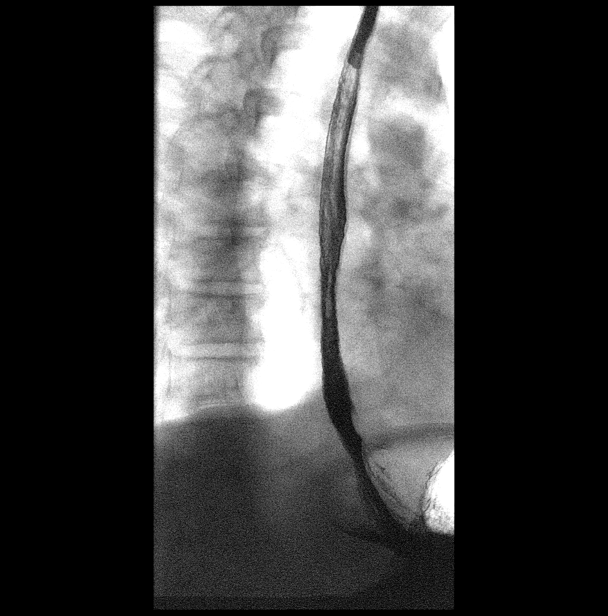
[frame 69/80]
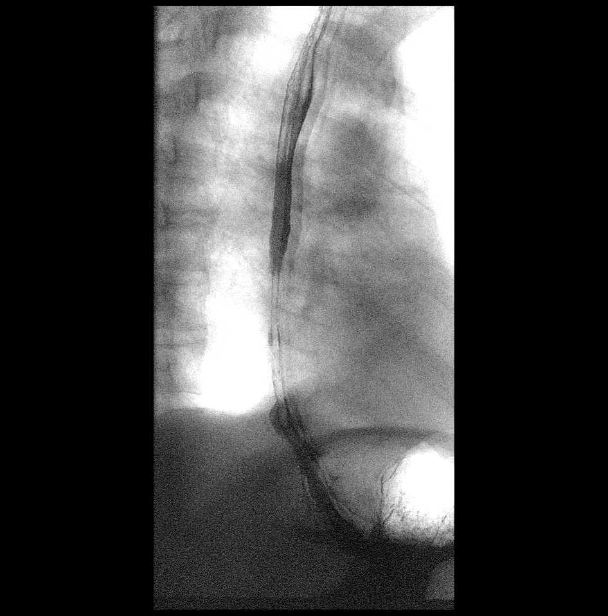

[Series 4: cp_standard · 0.25mm/px · 1 of 1 slices shown (4 of 5)]
[im 1/1]
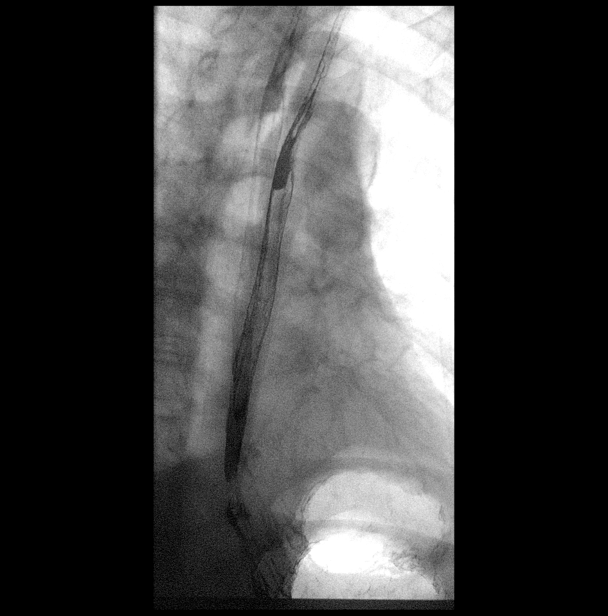

[Series 5: cp_standard · 0.28mm/px · 1 of 1 slices shown (5 of 5)]
[im 1/1]
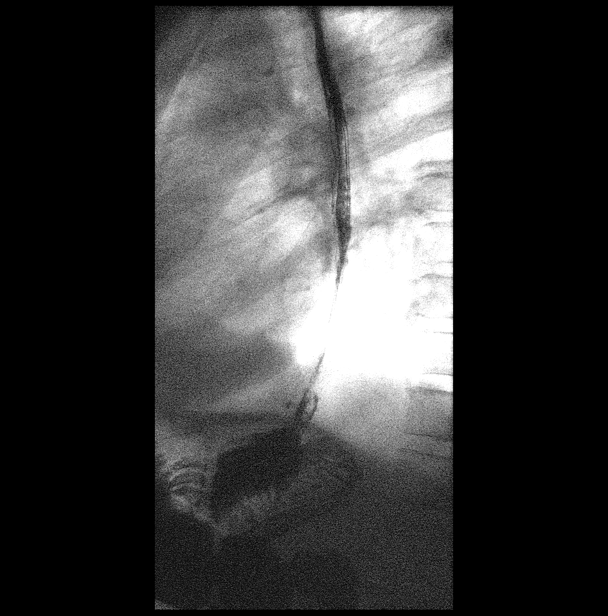

[13 of 13 positions shown; findings below may reference images not displayed]

FINDINGS: Limited evaluation secondary to patient motion resulting from pain.

Normal pharyngeal anatomy and motility. Contrast flowed freely
through the esophagus without evidence of a stricture or mass. Mild
mucosal thickening and irregularity without ulceration. Esophageal
motility was normal. Mild gastroesophageal reflux. No definite
hiatal hernia was demonstrated.

At the end of the examination a 13 mm barium tablet was administered
which transited through the esophagus and esophagogastric junction
without delay.
IMPRESSION: 1. Mild mucosal thickening and irregularity without ulceration as
can be seen with mild esophagitis.
2. Mild gastroesophageal reflux.
3. No esophageal stricture.
